# Patient Record
Sex: Female | Born: 1965
Health system: Southern US, Community
[De-identification: ages and names within clinical notes are randomized; demographics above are authoritative.]

## PROBLEM LIST (undated history)

## (undated) DIAGNOSIS — D509 Iron deficiency anemia, unspecified: Secondary | ICD-10-CM

## (undated) DIAGNOSIS — U071 COVID-19: Secondary | ICD-10-CM

## (undated) DIAGNOSIS — Q6102 Congenital multiple renal cysts: Secondary | ICD-10-CM

## (undated) DIAGNOSIS — C4492 Squamous cell carcinoma of skin, unspecified: Secondary | ICD-10-CM

## (undated) DIAGNOSIS — Z8669 Personal history of other diseases of the nervous system and sense organs: Secondary | ICD-10-CM

## (undated) DIAGNOSIS — F419 Anxiety disorder, unspecified: Secondary | ICD-10-CM

## (undated) DIAGNOSIS — K5900 Constipation, unspecified: Secondary | ICD-10-CM

## (undated) DIAGNOSIS — M1990 Unspecified osteoarthritis, unspecified site: Secondary | ICD-10-CM

## (undated) DIAGNOSIS — G510 Bell's palsy: Secondary | ICD-10-CM

## (undated) DIAGNOSIS — R200 Anesthesia of skin: Secondary | ICD-10-CM

## (undated) DIAGNOSIS — F329 Major depressive disorder, single episode, unspecified: Secondary | ICD-10-CM

## (undated) DIAGNOSIS — D219 Benign neoplasm of connective and other soft tissue, unspecified: Secondary | ICD-10-CM

## (undated) DIAGNOSIS — G4700 Insomnia, unspecified: Secondary | ICD-10-CM

## (undated) DIAGNOSIS — T7491XA Unspecified adult maltreatment, confirmed, initial encounter: Secondary | ICD-10-CM

## (undated) DIAGNOSIS — F32A Depression, unspecified: Secondary | ICD-10-CM

## (undated) DIAGNOSIS — R351 Nocturia: Secondary | ICD-10-CM

## (undated) DIAGNOSIS — M62838 Other muscle spasm: Secondary | ICD-10-CM

## (undated) DIAGNOSIS — K219 Gastro-esophageal reflux disease without esophagitis: Secondary | ICD-10-CM

## (undated) DIAGNOSIS — R531 Weakness: Secondary | ICD-10-CM

## (undated) DIAGNOSIS — L409 Psoriasis, unspecified: Secondary | ICD-10-CM

## (undated) DIAGNOSIS — I10 Essential (primary) hypertension: Secondary | ICD-10-CM

## (undated) HISTORY — DX: Nocturia: R35.1

## (undated) HISTORY — PX: MOHS SURGERY: SUR867

## (undated) HISTORY — DX: Anxiety disorder, unspecified: F41.9

## (undated) HISTORY — DX: Congenital multiple renal cysts: Q61.02

## (undated) HISTORY — DX: COVID-19: U07.1

## (undated) HISTORY — DX: Bell's palsy: G51.0

## (undated) HISTORY — PX: KNEE SURGERY: SHX244

## (undated) HISTORY — DX: Essential (primary) hypertension: I10

## (undated) HISTORY — DX: Unspecified adult maltreatment, confirmed, initial encounter: T74.91XA

## (undated) HISTORY — DX: Depression, unspecified: F32.A

## (undated) HISTORY — DX: Squamous cell carcinoma of skin, unspecified: C44.92

## (undated) HISTORY — DX: Psoriasis, unspecified: L40.9

## (undated) HISTORY — DX: Anesthesia of skin: R20.0

## (undated) HISTORY — DX: Weakness: R53.1

## (undated) HISTORY — DX: Gastro-esophageal reflux disease without esophagitis: K21.9

## (undated) HISTORY — DX: Morbid (severe) obesity due to excess calories: E66.01

## (undated) HISTORY — DX: Other muscle spasm: M62.838

## (undated) HISTORY — DX: Personal history of other diseases of the nervous system and sense organs: Z86.69

## (undated) HISTORY — DX: Iron deficiency anemia, unspecified: D50.9

## (undated) HISTORY — DX: Insomnia, unspecified: G47.00

## (undated) HISTORY — DX: Major depressive disorder, single episode, unspecified: F32.9

## (undated) HISTORY — DX: Benign neoplasm of connective and other soft tissue, unspecified: D21.9

## (undated) HISTORY — PX: NECK SURGERY: SHX720

---

## 2010-03-19 ENCOUNTER — Ambulatory Visit: Payer: Self-pay | Admitting: Obstetrics and Gynecology

## 2010-09-16 ENCOUNTER — Emergency Department (HOSPITAL_COMMUNITY): Admission: EM | Admit: 2010-09-16 | Discharge: 2010-09-16 | Payer: Self-pay | Admitting: Emergency Medicine

## 2010-09-21 ENCOUNTER — Emergency Department: Payer: Self-pay | Admitting: Emergency Medicine

## 2011-02-27 ENCOUNTER — Emergency Department: Payer: Self-pay | Admitting: Emergency Medicine

## 2011-04-09 NOTE — Assessment & Plan Note (Signed)
NAME:  Erika Hanson, Erika Hanson.:  [id number]   MEDICAL RECORD NO.:  [id number]          PATIENT TYPE:  POB   LOCATION:  [facility] at [facility]         FACILITY:  [facility]   PHYSICIAN:  Argentina Donovan, MD        DATE OF BIRTH:  24-Dec-1965   DATE OF SERVICE:  03/19/2010                                  CLINIC NOTE   The patient is a 44 year old Caucasian female, gravida 1, para 1-0-0-1  with a son 7 years old.  She only had 1 child because of desire and  with exception of her weight 310 pounds and height of 5 feet 5 inches  tall.  She has no major medical complaints except that over this past 6  months her period has gotten much much heavier.  She passes large clots.  They are predictable to the day when they should start and they do not  last more than 5 days.  However, during the 5 days, she bleeds very  heavy with large clots passing and has a lot of cramping the first 2  days of the period, that radiate into her back and down into her thighs.  She states that she bruises fairly easily.  She does not smoke, drink,  or take illicit drugs.  She has no medical allergies known and she has  always had normal Pap smears.  She has never had a mammogram and she has  no family history of breast malignancy.   PHYSICAL EXAMINATION:  VITAL SIGNS:  Blood pressure 149/91 with pulse of  78 per minute.  GENERAL:  She is a well-developed, markedly obese white female, in no  acute distress.  NECK:  Thyroid symmetrical.  No dominant masses noted.  BREASTS:  Symmetrical.  No masses.  No nipple discharge.  No  supraclavicular or axillary nodes palpable.  ABDOMEN:  Soft, flat, nontender.  No masses, no organomegaly.  GENITALIA:  External is normal.  BUS within normal limits.  The vagina  is clean and well rugated.  The cervix is clean, parous, and easy to  visualize.  The uterus could not be outlined nor the adnexa because of  habitus of the patient.   The plan is to get a CBC, TSH, PT and PTT  on this patient.  We are going  to follow that up with an ultrasound to evaluate the endometrium and  check for an ovarian cyst which she said she had several years ago was  told she had a cyst on the ovary.  Have her come back in 2 weeks giving  her information for endometrial ablation and then make a decision  whether she needs an endometrial biopsy or not.  My feeling is that she  probably will and then talk about the possibility of trying to control  this with hormone therapy and/or endometrial ablation.   IMPRESSION:  Six-month onset of severe menorrhagia with increasing  dysmenorrhea.           ______________________________  Argentina Donovan, MD     PR/MEDQ  D:  03/19/2010  T:  03/20/2010  Job:  [id number]

## 2011-04-28 ENCOUNTER — Emergency Department (HOSPITAL_COMMUNITY)
Admission: EM | Admit: 2011-04-28 | Discharge: 2011-04-28 | Disposition: A | Discharge status: Home / Self Care | Payer: Self-pay | Attending: Emergency Medicine | Admitting: Emergency Medicine

## 2011-04-28 DIAGNOSIS — M549 Dorsalgia, unspecified: Secondary | ICD-10-CM | POA: Insufficient documentation

## 2011-04-28 DIAGNOSIS — N390 Urinary tract infection, site not specified: Secondary | ICD-10-CM | POA: Insufficient documentation

## 2011-04-28 DIAGNOSIS — R350 Frequency of micturition: Secondary | ICD-10-CM | POA: Insufficient documentation

## 2011-04-28 LAB — URINALYSIS, ROUTINE W REFLEX MICROSCOPIC
Bilirubin Urine: NEGATIVE
Glucose, UA: NEGATIVE mg/dL
Ketones, ur: NEGATIVE mg/dL
Protein, ur: 30 mg/dL — AB

## 2011-04-28 LAB — URINE MICROSCOPIC-ADD ON

## 2012-05-20 ENCOUNTER — Emergency Department (HOSPITAL_COMMUNITY): Payer: Self-pay

## 2012-05-20 ENCOUNTER — Emergency Department (HOSPITAL_COMMUNITY)
Admission: EM | Admit: 2012-05-20 | Discharge: 2012-05-20 | Disposition: A | Discharge status: Home / Self Care | Payer: Self-pay | Attending: Emergency Medicine | Admitting: Emergency Medicine

## 2012-05-20 ENCOUNTER — Encounter (HOSPITAL_COMMUNITY): Payer: Self-pay

## 2012-05-20 DIAGNOSIS — R062 Wheezing: Secondary | ICD-10-CM | POA: Insufficient documentation

## 2012-05-20 MED ORDER — ALBUTEROL SULFATE HFA 108 (90 BASE) MCG/ACT IN AERS: 1.0000 | INHALATION_SPRAY | Freq: Four times a day (QID) | RESPIRATORY_TRACT | Status: DC | PRN

## 2012-05-20 MED ORDER — PREDNISONE 10 MG PO TABS: 20.0000 mg | ORAL_TABLET | Freq: Every day | ORAL | Status: DC

## 2012-05-20 MED ORDER — PREDNISONE 20 MG PO TABS
60.0000 mg | ORAL_TABLET | Freq: Once | ORAL | Status: AC
  Administered 2012-05-20: 60 mg via ORAL
  Filled 2012-05-20: qty 3

## 2012-05-20 MED ORDER — SODIUM CHLORIDE 0.9 % IN NEBU
INHALATION_SOLUTION | RESPIRATORY_TRACT | Status: AC
  Administered 2012-05-20: 3 mL
  Filled 2012-05-20: qty 3

## 2012-05-20 MED ORDER — ALBUTEROL SULFATE (5 MG/ML) 0.5% IN NEBU
5.0000 mg | INHALATION_SOLUTION | Freq: Once | RESPIRATORY_TRACT | Status: AC
  Administered 2012-05-20: 5 mg via RESPIRATORY_TRACT
  Filled 2012-05-20: qty 1

## 2012-05-20 NOTE — ED Provider Notes (Cosign Needed)
History     CSN: [id number]  Arrival date & time 05/20/12  0106   First MD Initiated Contact with Patient 05/20/12 0214      Chief Complaint  Patient presents with  . Wheezing  . Shortness of Breath    (Consider location/radiation/quality/duration/timing/severity/associated sxs/prior treatment) HPI  Erika Hanson is a 46 y.o. female who presents to the Emergency Department complaining of wheezing and shortness of breath that began when she was getting ready for bed. Denies recent illness, cough, fever, chills. She is not aware of any exposure to cause wheezing. She has taken no medicines.  History reviewed. No pertinent past medical history.  History reviewed. No pertinent past surgical history.  No family history on file.  History  Substance Use Topics  . Smoking status: Never Smoker   . Smokeless tobacco: Not on file  . Alcohol Use:     OB History    Grav Para Term Preterm Abortions TAB SAB Ect Mult Living                  Review of Systems  Constitutional: Negative for fever.       10 Systems reviewed and are negative for acute change except as noted in the HPI.  HENT: Negative for congestion.   Eyes: Negative for discharge and redness.  Respiratory: Positive for shortness of breath and wheezing. Negative for cough.   Cardiovascular: Negative for chest pain.  Gastrointestinal: Negative for vomiting and abdominal pain.  Musculoskeletal: Negative for back pain.  Skin: Negative for rash.  Neurological: Negative for syncope, numbness and headaches.  Psychiatric/Behavioral:       No behavior change.    Allergies  Review of patient's allergies indicates no known allergies.  Home Medications   Current Outpatient Rx  Name Route Sig Dispense Refill  . IBUPROFEN 200 MG PO TABS Oral Take 200 mg by mouth every 6 (six) hours as needed.    Marland Kitchen NAPROXEN 250 MG PO TABS Oral Take 250 mg by mouth 2 (two) times daily with a meal.      BP 163/77  Pulse 69  Temp  97.7 F (36.5 C) (Oral)  Resp 22  Ht 5\' 5"  (1.651 m)  Wt 260 lb (117.935 kg)  BMI 43.27 kg/m2  SpO2 97%  LMP 05/16/2012  Physical Exam  Nursing note and vitals reviewed. Constitutional: She appears well-developed and well-nourished. She appears distressed.       Awake, alert, nontoxic appearance.  HENT:  Head: Atraumatic.  Eyes: Right eye exhibits no discharge. Left eye exhibits no discharge.  Neck: Neck supple.  Cardiovascular: Normal rate and normal heart sounds.   Pulmonary/Chest: Effort normal. She has wheezes. She exhibits no tenderness.  Abdominal: Soft. Bowel sounds are normal. There is no tenderness. There is no rebound.  Musculoskeletal: She exhibits no tenderness.       Baseline ROM, no obvious new focal weakness.  Neurological:       Mental status and motor strength appears baseline for patient and situation.  Skin: No rash noted.  Psychiatric: She has a normal mood and affect.    ED Course  Procedures (including critical care time)  Labs Reviewed - No data to display Dg Chest 2 View  05/20/2012  *RADIOLOGY REPORT*  Clinical Data: Shortness of breath and wheezing  CHEST - 2 VIEW  Comparison: None.  Findings: Shallow inspiration.  Heart size and pulmonary vascularity are normal for technique.  No focal airspace consolidation in the lungs.  No  blunting of costophrenic angles. No pneumothorax.  Degenerative changes in the spine.  IMPRESSION: Shallow inspiration.  No evidence of active pulmonary disease.  Original Report Authenticated By: Marlon Pel, M.D.        MDM  Patient with onset of wheezing and shortness of breath when getting ready for bed. Given albuterol nebulizer treatment with resolution of the wheezing. Initiated steroid therapy. Pt feels improved after observation and/or treatment in ED.Pt stable in ED with no significant deterioration in condition.The patient appears reasonably screened and/or stabilized for discharge and I doubt any other  medical condition or other [facility] requiring further screening, evaluation, or treatment in the ED at this time prior to discharge.  MDM Reviewed: nursing note and vitals           Nicoletta Dress. Colon Branch, MD 05/20/12 [phone]

## 2012-05-20 NOTE — ED Notes (Signed)
Discharge instructions reviewed with pt, questions answered. Pt verbalized understanding.  

## 2012-05-20 NOTE — Discharge Instructions (Signed)
Your xray does not show bronchitis or pneumonia.Take all of the prednisone. Use the inhaler 4 times a day for the next 4 days then as needed for wheezing.

## 2012-05-20 NOTE — ED Notes (Signed)
Pt was lying down to sleep when she states she had sudden onset of sob and wheezing, states if she tries to cough the mucus just returns in her throat

## 2012-10-23 ENCOUNTER — Encounter (HOSPITAL_COMMUNITY): Payer: Self-pay | Admitting: Emergency Medicine

## 2012-10-23 ENCOUNTER — Emergency Department (HOSPITAL_COMMUNITY)
Admission: EM | Admit: 2012-10-23 | Discharge: 2012-10-23 | Disposition: A | Discharge status: Home / Self Care | Payer: Self-pay | Attending: Emergency Medicine | Admitting: Emergency Medicine

## 2012-10-23 DIAGNOSIS — M5432 Sciatica, left side: Secondary | ICD-10-CM

## 2012-10-23 DIAGNOSIS — M5430 Sciatica, unspecified side: Secondary | ICD-10-CM | POA: Insufficient documentation

## 2012-10-23 DIAGNOSIS — R3915 Urgency of urination: Secondary | ICD-10-CM | POA: Insufficient documentation

## 2012-10-23 DIAGNOSIS — R300 Dysuria: Secondary | ICD-10-CM | POA: Insufficient documentation

## 2012-10-23 DIAGNOSIS — Z9889 Other specified postprocedural states: Secondary | ICD-10-CM | POA: Insufficient documentation

## 2012-10-23 DIAGNOSIS — R350 Frequency of micturition: Secondary | ICD-10-CM | POA: Insufficient documentation

## 2012-10-23 DIAGNOSIS — N390 Urinary tract infection, site not specified: Secondary | ICD-10-CM | POA: Insufficient documentation

## 2012-10-23 DIAGNOSIS — M545 Low back pain, unspecified: Secondary | ICD-10-CM | POA: Insufficient documentation

## 2012-10-23 LAB — URINALYSIS, ROUTINE W REFLEX MICROSCOPIC
Nitrite: NEGATIVE
Specific Gravity, Urine: 1.01 (ref 1.005–1.030)
pH: 6 (ref 5.0–8.0)

## 2012-10-23 LAB — URINE MICROSCOPIC-ADD ON

## 2012-10-23 MED ORDER — PREDNISONE 50 MG PO TABS: 50.0000 mg | ORAL_TABLET | Freq: Every day | ORAL | Status: DC

## 2012-10-23 MED ORDER — HYDROCODONE-ACETAMINOPHEN 5-325 MG PO TABS: 1.0000 | ORAL_TABLET | Freq: Four times a day (QID) | ORAL | Status: AC | PRN

## 2012-10-23 MED ORDER — HYDROCODONE-ACETAMINOPHEN 5-325 MG PO TABS
1.0000 | ORAL_TABLET | Freq: Once | ORAL | Status: AC
  Administered 2012-10-23: 1 via ORAL
  Filled 2012-10-23: qty 1

## 2012-10-23 MED ORDER — PHENAZOPYRIDINE HCL 100 MG PO TABS
200.0000 mg | ORAL_TABLET | Freq: Once | ORAL | Status: AC
  Administered 2012-10-23: 200 mg via ORAL
  Filled 2012-10-23: qty 2

## 2012-10-23 MED ORDER — CIPROFLOXACIN HCL 500 MG PO TABS: 500.0000 mg | ORAL_TABLET | Freq: Two times a day (BID) | ORAL | Status: DC

## 2012-10-23 MED ORDER — CIPROFLOXACIN HCL 250 MG PO TABS
500.0000 mg | ORAL_TABLET | Freq: Once | ORAL | Status: AC
  Administered 2012-10-23: 500 mg via ORAL
  Filled 2012-10-23: qty 2

## 2012-10-23 NOTE — ED Notes (Signed)
EDPa in room to see pt for initial assessment.

## 2012-10-23 NOTE — ED Provider Notes (Signed)
Medical screening examination/treatment/procedure(s) were performed by non-physician practitioner and as supervising physician I was immediately available for consultation/collaboration.   Roczen Waymire, MD 10/23/12 1515 

## 2012-10-23 NOTE — ED Notes (Signed)
Pt c/o dysuria x 2 weeks. C/o lower back pain that radiates into groin and anterior thighs. Nad.

## 2012-10-23 NOTE — ED Provider Notes (Signed)
History     CSN: [id number]  Arrival date & time 10/23/12  1032   First MD Initiated Contact with Patient 10/23/12 1037      Chief Complaint  Patient presents with  . Dysuria  . Back Pain    (Consider location/radiation/quality/duration/timing/severity/associated sxs/prior treatment) HPI Comments: C/o dysuria, frequency and urgency but no hematuria for the past 3 days.  Also having low back pain that is worse with movement and palpation.  Radiates to L great and 2nd toe.    No injury.  No fever or chills.  No PCP.  The history is provided by the patient. No language interpreter was used.    History reviewed. No pertinent past medical history.  Past Surgical History  Procedure Date  . Cesarean section     History reviewed. No pertinent family history.  History  Substance Use Topics  . Smoking status: Never Smoker   . Smokeless tobacco: Not on file  . Alcohol Use: No    OB History    Grav Para Term Preterm Abortions TAB SAB Ect Mult Living                  Review of Systems  Constitutional: Negative for fever and chills.  Genitourinary: Positive for dysuria, urgency and frequency. Negative for hematuria, vaginal bleeding and vaginal discharge.  Musculoskeletal: Positive for back pain.  All other systems reviewed and are negative.    Allergies  Review of patient's allergies indicates no known allergies.  Home Medications   Current Outpatient Rx  Name  Route  Sig  Dispense  Refill  . IBUPROFEN 200 MG PO TABS   Oral   Take 600 mg by mouth every 6 (six) hours as needed. Pain         . CIPROFLOXACIN HCL 500 MG PO TABS   Oral   Take 1 tablet (500 mg total) by mouth 2 (two) times daily.   14 tablet   0   . HYDROCODONE-ACETAMINOPHEN 5-325 MG PO TABS   Oral   Take 1 tablet by mouth every 6 (six) hours as needed for pain.   20 tablet   0   . PREDNISONE 50 MG PO TABS   Oral   Take 1 tablet (50 mg total) by mouth daily.   6 tablet   0     BP  147/63  Pulse 75  Temp 97.6 F (36.4 C) (Oral)  Resp 19  SpO2 100%  LMP 09/26/2012  Physical Exam  Nursing note and vitals reviewed. Constitutional: She is oriented to person, place, and time. She appears well-developed and well-nourished. No distress.  HENT:  Head: Normocephalic and atraumatic.  Eyes: EOM are normal.  Neck: Normal range of motion.  Cardiovascular: Normal rate, regular rhythm and normal heart sounds.   Pulmonary/Chest: Effort normal and breath sounds normal.  Abdominal: Soft. Bowel sounds are normal. She exhibits no distension. There is tenderness in the suprapubic area. There is no guarding and no CVA tenderness.    Musculoskeletal: She exhibits tenderness.       Lumbar back: She exhibits decreased range of motion, tenderness, bony tenderness and pain. She exhibits no swelling, no edema, no deformity, no laceration, no spasm and normal pulse.       Back:  Neurological: She is alert and oriented to person, place, and time.  Skin: Skin is warm and dry.  Psychiatric: She has a normal mood and affect. Judgment normal.    ED Course  Procedures (including critical  care time)  Labs Reviewed  URINALYSIS, ROUTINE W REFLEX MICROSCOPIC - Abnormal; Notable for the following:    Hgb urine dipstick LARGE (*)     Protein, ur TRACE (*)     Leukocytes, UA MODERATE (*)     All other components within normal limits  URINE MICROSCOPIC-ADD ON - Abnormal; Notable for the following:    Bacteria, UA MANY (*)     All other components within normal limits  URINE CULTURE   No results found.   1. UTI (urinary tract infection)   2. Lumbar back pain   3. Left sided sciatica       MDM  rx-cipro 500 mg BID x 7 days rx-hydrocodone, 20 rx-prednisone 50 mg x 6 days. Return to ED prn        Evalina Field, PA 10/23/12 1213

## 2012-10-25 LAB — URINE CULTURE: Colony Count: >=100000

## 2012-10-26 NOTE — ED Notes (Signed)
+   urine Patient treated with cipro-sensitive to same-chart appended per protocol MD. 

## 2014-02-24 DIAGNOSIS — J159 Unspecified bacterial pneumonia: Secondary | ICD-10-CM | POA: Insufficient documentation

## 2014-02-24 DIAGNOSIS — Z792 Long term (current) use of antibiotics: Secondary | ICD-10-CM | POA: Insufficient documentation

## 2014-02-24 DIAGNOSIS — IMO0002 Reserved for concepts with insufficient information to code with codable children: Secondary | ICD-10-CM | POA: Insufficient documentation

## 2014-02-25 ENCOUNTER — Emergency Department (HOSPITAL_COMMUNITY): Payer: Self-pay

## 2014-02-25 ENCOUNTER — Emergency Department (HOSPITAL_COMMUNITY)
Admission: EM | Admit: 2014-02-25 | Discharge: 2014-02-25 | Disposition: A | Discharge status: Home / Self Care | Payer: Self-pay | Attending: Emergency Medicine | Admitting: Emergency Medicine

## 2014-02-25 ENCOUNTER — Encounter (HOSPITAL_COMMUNITY): Payer: Self-pay | Admitting: Emergency Medicine

## 2014-02-25 DIAGNOSIS — J189 Pneumonia, unspecified organism: Secondary | ICD-10-CM

## 2014-02-25 LAB — COMPREHENSIVE METABOLIC PANEL
ALBUMIN: 3.1 g/dL — AB (ref 3.5–5.2)
ALT: 24 U/L (ref 0–35)
AST: 18 U/L (ref 0–37)
Alkaline Phosphatase: 114 U/L (ref 39–117)
BUN: 14 mg/dL (ref 6–23)
CALCIUM: 9.2 mg/dL (ref 8.4–10.5)
CO2: 28 mEq/L (ref 19–32)
Chloride: 102 mEq/L (ref 96–112)
Creatinine, Ser: 0.89 mg/dL (ref 0.50–1.10)
GFR calc non Af Amer: 77 mL/min — ABNORMAL LOW (ref >90)
GFR, EST AFRICAN AMERICAN: 89 mL/min — AB (ref >90)
GLUCOSE: 106 mg/dL — AB (ref 70–99)
Potassium: 4.3 mEq/L (ref 3.7–5.3)
SODIUM: 139 meq/L (ref 137–147)
TOTAL PROTEIN: 6.8 g/dL (ref 6.0–8.3)
Total Bilirubin: 0.2 mg/dL — ABNORMAL LOW (ref 0.3–1.2)

## 2014-02-25 LAB — D-DIMER, QUANTITATIVE (NOT AT ARMC): D DIMER QUANT: 0.31 ug{FEU}/mL (ref 0.00–0.48)

## 2014-02-25 LAB — CBC WITH DIFFERENTIAL/PLATELET
BASOS PCT: 0 % (ref 0–1)
Basophils Absolute: 0 10*3/uL (ref 0.0–0.1)
EOS ABS: 0.2 10*3/uL (ref 0.0–0.7)
EOS PCT: 3 % (ref 0–5)
HCT: 38.7 % (ref 36.0–46.0)
Hemoglobin: 12.4 g/dL (ref 12.0–15.0)
LYMPHS ABS: 1.9 10*3/uL (ref 0.7–4.0)
Lymphocytes Relative: 26 % (ref 12–46)
MCH: 26.6 pg (ref 26.0–34.0)
MCHC: 32 g/dL (ref 30.0–36.0)
MCV: 83 fL (ref 78.0–100.0)
MONOS PCT: 8 % (ref 3–12)
Monocytes Absolute: 0.6 10*3/uL (ref 0.1–1.0)
Neutro Abs: 4.5 10*3/uL (ref 1.7–7.7)
Neutrophils Relative %: 63 % (ref 43–77)
PLATELETS: 243 10*3/uL (ref 150–400)
RBC: 4.66 MIL/uL (ref 3.87–5.11)
RDW: 14.8 % (ref 11.5–15.5)
WBC: 7.2 10*3/uL (ref 4.0–10.5)

## 2014-02-25 LAB — PRO B NATRIURETIC PEPTIDE: Pro B Natriuretic peptide (BNP): 81.3 pg/mL (ref 0–125)

## 2014-02-25 LAB — TROPONIN I

## 2014-02-25 MED ORDER — AZITHROMYCIN 250 MG PO TABS: ORAL_TABLET | ORAL | Status: DC

## 2014-02-25 NOTE — ED Notes (Addendum)
Pt reporting SOB worse at night when laying down.  Pt also reporting some swelling in lower extremities for approximately for 1 week.

## 2014-02-25 NOTE — ED Provider Notes (Signed)
CSN: [id number]     Arrival date & time 02/24/14  2336 History   First MD Initiated Contact with Patient 02/25/14 0056     Chief Complaint  Patient presents with  . sob x 1 week      (Consider location/radiation/quality/duration/timing/severity/associated sxs/prior Treatment) HPI Comments: Patient is a 48 year old female with history of morbid obesity. She presents with complaints of difficulty breathing which has been occurring for the past week. She states it is worse when she tries to go to sleep at night. When she lies flat she begins to wheeze. She denies any fevers, chills, or productive cough. She denies having any chest pain. She has no prior history of asthma or cardiac disease.  Patient is a 48 y.o. female presenting with shortness of breath. The history is provided by the patient.  Shortness of Breath Severity:  Moderate Onset quality:  Gradual Duration:  1 week Timing:  Intermittent Progression:  Worsening Chronicity:  New Context: activity   Context comment:  Lying flat Relieved by:  Nothing Worsened by:  Nothing tried Ineffective treatments:  None tried   History reviewed. No pertinent past medical history. Past Surgical History  Procedure Laterality Date  . Cesarean section     No family history on file. History  Substance Use Topics  . Smoking status: Never Smoker   . Smokeless tobacco: Not on file  . Alcohol Use: No   OB History   Grav Para Term Preterm Abortions TAB SAB Ect Mult Living                 Review of Systems  Respiratory: Positive for shortness of breath.   All other systems reviewed and are negative.      Allergies  Review of patient's allergies indicates no known allergies.  Home Medications   Current Outpatient Rx  Name  Route  Sig  Dispense  Refill  . ibuprofen (ADVIL,MOTRIN) 200 MG tablet   Oral   Take 600 mg by mouth every 6 (six) hours as needed. Pain         . ciprofloxacin (CIPRO) 500 MG tablet   Oral   Take 1  tablet (500 mg total) by mouth 2 (two) times daily.   14 tablet   0   . predniSONE (DELTASONE) 50 MG tablet   Oral   Take 1 tablet (50 mg total) by mouth daily.   6 tablet   0    BP 138/72  Pulse 79  Temp(Src) 97.8 F (36.6 C) (Oral)  Resp 22  Ht 5\' 5"  (1.651 m)  Wt 280 lb (127.007 kg)  BMI 46.59 kg/m2  SpO2 100%  LMP 02/15/2014 Physical Exam  Nursing note and vitals reviewed. Constitutional: She is oriented to person, place, and time. She appears well-developed and well-nourished. No distress.  HENT:  Head: Normocephalic and atraumatic.  Neck: Normal range of motion. Neck supple.  Cardiovascular: Normal rate and regular rhythm.  Exam reveals no gallop and no friction rub.   No murmur heard. Pulmonary/Chest: Effort normal and breath sounds normal. No respiratory distress. She has no wheezes.  Abdominal: Soft. Bowel sounds are normal. She exhibits no distension. There is no tenderness.  Musculoskeletal: Normal range of motion.  Neurological: She is alert and oriented to person, place, and time.  Skin: Skin is warm and dry. She is not diaphoretic.    ED Course  Procedures (including critical care time) Labs Review Labs Reviewed  CBC WITH DIFFERENTIAL  COMPREHENSIVE METABOLIC PANEL  TROPONIN  I  PRO B NATRIURETIC PEPTIDE   Imaging Review Dg Chest 2 View  02/25/2014   CLINICAL DATA:  Shortness of breath for 1 week.  EXAM: CHEST  2 VIEW  COMPARISON:  Chest radiograph performed 05/20/2012  FINDINGS: The lungs are well-aerated. Mild focal lower lobe opacity is suggested on the lateral view, which could reflect mild pneumonia. There is no evidence of pleural effusion or pneumothorax.  The heart is normal in size; the mediastinal contour is within normal limits. No acute osseous abnormalities are seen.  IMPRESSION: Mild focal lower lobe opacity suggested on the lateral view, which could reflect mild pneumonia, though this is not well characterized on the frontal view.    Electronically Signed   By: Garald Balding M.D.   On: 02/25/2014 01:00     EKG Interpretation   Date/Time:  Friday February 25 2014 02:12:06 EDT Ventricular Rate:  75 PR Interval:  130 QRS Duration: 94 QT Interval:  396 QTC Calculation: 442 R Axis:   29 Text Interpretation:  Normal sinus rhythm Normal ECG No previous ECGs  available Confirmed by DELOS  MD, Smera Guyette ([id number]) on 02/25/2014 2:34:17 AM      MDM   Final diagnoses:  None    Patient is a 48 year old female with complaints of difficulty breathing when she lies down to sleep at night. This is been going on for the past week. Workup reveals a chest x-ray suggestive of a subtle pneumonia, however remainder of the workup is unremarkable. Her EKG is normal and there is no evidence for congestive heart failure. Her d-dimer is negative and I have a low suspicion for PE. I feel as though this rules out this condition out. She will be treated for the pneumonia with Zithromax and is to followup with her primary Dr. if not improving.    Veryl Speak, MD 02/25/14 [phone]

## 2014-02-25 NOTE — ED Notes (Signed)
Pt states for the past week when she goes to bed, she is only able to sleep about and hour and she wakes up and feels sob and can hear herself wheezing.  Pt states when she takes a deep breath she has pain to right posterior chest.

## 2014-02-25 NOTE — Discharge Instructions (Signed)
Zithromax as prescribed.  Return to the ER if you develop worsening breathing, severe chest pain, or other new or concerning symptoms.   Pneumonia, Adult Pneumonia is an infection of the lungs.  CAUSES Pneumonia may be caused by bacteria or a virus. Usually, these infections are caused by breathing infectious particles into the lungs (respiratory tract). SYMPTOMS   Cough.  Fever.  Chest pain.  Increased rate of breathing.  Wheezing.  Mucus production. DIAGNOSIS  If you have the common symptoms of pneumonia, your caregiver will typically confirm the diagnosis with a chest X-ray. The X-ray will show an abnormality in the lung (pulmonary infiltrate) if you have pneumonia. Other tests of your blood, urine, or sputum may be done to find the specific cause of your pneumonia. Your caregiver may also do tests (blood gases or pulse oximetry) to see how well your lungs are working. TREATMENT  Some forms of pneumonia may be spread to other people when you cough or sneeze. You may be asked to wear a mask before and during your exam. Pneumonia that is caused by bacteria is treated with antibiotic medicine. Pneumonia that is caused by the influenza virus may be treated with an antiviral medicine. Most other viral infections must run their course. These infections will not respond to antibiotics.  PREVENTION A pneumococcal shot (vaccine) is available to prevent a common bacterial cause of pneumonia. This is usually suggested for:  People over 42 years old.  Patients on chemotherapy.  People with chronic lung problems, such as bronchitis or emphysema.  People with immune system problems. If you are over 65 or have a high risk condition, you may receive the pneumococcal vaccine if you have not received it before. In some countries, a routine influenza vaccine is also recommended. This vaccine can help prevent some cases of pneumonia.You may be offered the influenza vaccine as part of your  care. If you smoke, it is time to quit. You may receive instructions on how to stop smoking. Your caregiver can provide medicines and counseling to help you quit. HOME CARE INSTRUCTIONS   Cough suppressants may be used if you are losing too much rest. However, coughing protects you by clearing your lungs. You should avoid using cough suppressants if you can.  Your caregiver may have prescribed medicine if he or she thinks your pneumonia is caused by a bacteria or influenza. Finish your medicine even if you start to feel better.  Your caregiver may also prescribe an expectorant. This loosens the mucus to be coughed up.  Only take over-the-counter or prescription medicines for pain, discomfort, or fever as directed by your caregiver.  Do not smoke. Smoking is a common cause of bronchitis and can contribute to pneumonia. If you are a smoker and continue to smoke, your cough may last several weeks after your pneumonia has cleared.  A cold steam vaporizer or humidifier in your room or home may help loosen mucus.  Coughing is often worse at night. Sleeping in a semi-upright position in a recliner or using a couple pillows under your head will help with this.  Get rest as you feel it is needed. Your body will usually let you know when you need to rest. SEEK IMMEDIATE MEDICAL CARE IF:   Your illness becomes worse. This is especially true if you are elderly or weakened from any other disease.  You cannot control your cough with suppressants and are losing sleep.  You begin coughing up blood.  You develop pain which is  getting worse or is uncontrolled with medicines.  You have a fever.  Any of the symptoms which initially brought you in for treatment are getting worse rather than better.  You develop shortness of breath or chest pain. MAKE SURE YOU:   Understand these instructions.  Will watch your condition.  Will get help right away if you are not doing well or get worse. Document  Released: 11/11/2005 Document Revised: 02/03/2012 Document Reviewed: 01/31/2011 [facility] Patient Information 2014 [place], [place].

## 2014-03-08 ENCOUNTER — Emergency Department (HOSPITAL_COMMUNITY)
Admission: EM | Admit: 2014-03-08 | Discharge: 2014-03-08 | Disposition: A | Discharge status: Home / Self Care | Payer: Self-pay | Attending: Emergency Medicine | Admitting: Emergency Medicine

## 2014-03-08 ENCOUNTER — Emergency Department (HOSPITAL_COMMUNITY): Payer: Self-pay

## 2014-03-08 ENCOUNTER — Encounter (HOSPITAL_COMMUNITY): Payer: Self-pay | Admitting: Emergency Medicine

## 2014-03-08 DIAGNOSIS — E669 Obesity, unspecified: Secondary | ICD-10-CM | POA: Insufficient documentation

## 2014-03-08 DIAGNOSIS — Z8701 Personal history of pneumonia (recurrent): Secondary | ICD-10-CM | POA: Insufficient documentation

## 2014-03-08 DIAGNOSIS — R079 Chest pain, unspecified: Secondary | ICD-10-CM | POA: Insufficient documentation

## 2014-03-08 DIAGNOSIS — Z792 Long term (current) use of antibiotics: Secondary | ICD-10-CM | POA: Insufficient documentation

## 2014-03-08 DIAGNOSIS — J40 Bronchitis, not specified as acute or chronic: Secondary | ICD-10-CM | POA: Insufficient documentation

## 2014-03-08 DIAGNOSIS — IMO0002 Reserved for concepts with insufficient information to code with codable children: Secondary | ICD-10-CM | POA: Insufficient documentation

## 2014-03-08 LAB — COMPREHENSIVE METABOLIC PANEL
ALBUMIN: 3.1 g/dL — AB (ref 3.5–5.2)
ALK PHOS: 118 U/L — AB (ref 39–117)
ALT: 20 U/L (ref 0–35)
AST: 16 U/L (ref 0–37)
BILIRUBIN TOTAL: 0.2 mg/dL — AB (ref 0.3–1.2)
BUN: 12 mg/dL (ref 6–23)
CHLORIDE: 103 meq/L (ref 96–112)
CO2: 29 mEq/L (ref 19–32)
Calcium: 9 mg/dL (ref 8.4–10.5)
Creatinine, Ser: 0.77 mg/dL (ref 0.50–1.10)
GFR calc Af Amer: >90 mL/min (ref >90)
GFR calc non Af Amer: >90 mL/min (ref >90)
Glucose, Bld: 108 mg/dL — ABNORMAL HIGH (ref 70–99)
POTASSIUM: 4.4 meq/L (ref 3.7–5.3)
Sodium: 140 mEq/L (ref 137–147)
Total Protein: 6.8 g/dL (ref 6.0–8.3)

## 2014-03-08 LAB — CBC WITH DIFFERENTIAL/PLATELET
BASOS ABS: 0 10*3/uL (ref 0.0–0.1)
BASOS PCT: 0 % (ref 0–1)
Eosinophils Absolute: 0.2 10*3/uL (ref 0.0–0.7)
Eosinophils Relative: 3 % (ref 0–5)
HCT: 39.9 % (ref 36.0–46.0)
HEMOGLOBIN: 13 g/dL (ref 12.0–15.0)
Lymphocytes Relative: 22 % (ref 12–46)
Lymphs Abs: 1.5 10*3/uL (ref 0.7–4.0)
MCH: 26.8 pg (ref 26.0–34.0)
MCHC: 32.6 g/dL (ref 30.0–36.0)
MCV: 82.3 fL (ref 78.0–100.0)
MONOS PCT: 8 % (ref 3–12)
Monocytes Absolute: 0.5 10*3/uL (ref 0.1–1.0)
NEUTROS ABS: 4.5 10*3/uL (ref 1.7–7.7)
NEUTROS PCT: 67 % (ref 43–77)
Platelets: 290 10*3/uL (ref 150–400)
RBC: 4.85 MIL/uL (ref 3.87–5.11)
RDW: 14.8 % (ref 11.5–15.5)
WBC: 6.7 10*3/uL (ref 4.0–10.5)

## 2014-03-08 LAB — LIPASE, BLOOD: Lipase: 37 U/L (ref 11–59)

## 2014-03-08 MED ORDER — PREDNISONE 20 MG PO TABS: ORAL_TABLET | ORAL | Status: DC

## 2014-03-08 MED ORDER — ALBUTEROL SULFATE HFA 108 (90 BASE) MCG/ACT IN AERS
2.0000 | INHALATION_SPRAY | Freq: Once | RESPIRATORY_TRACT | Status: AC
  Administered 2014-03-08: 2 via RESPIRATORY_TRACT
  Filled 2014-03-08: qty 6.7

## 2014-03-08 MED ORDER — PREDNISONE 50 MG PO TABS
60.0000 mg | ORAL_TABLET | Freq: Once | ORAL | Status: AC
  Administered 2014-03-08: 60 mg via ORAL
  Filled 2014-03-08 (×2): qty 1

## 2014-03-08 NOTE — Care Management Note (Signed)
ED/CM noted patient did not have health insurance and/or PCP listed in the computer.  Patient was given the [place] resource handout with information on the clinics, food pantries, and the handout for new health insurance sign-up.  Patient expressed appreciation for information received. Pt was also given a Rx discount card, with the disclaimer that CM has no way of knowing how much the discount will be.

## 2014-03-08 NOTE — ED Notes (Signed)
Pt reports was seen here on April 2 and diagnosed with pneumonia.  Reports was put on zpack and started feeling better.  Pt reports was having  pain in right side but decreased while on antibiotics.  Pt says since Saturday,  Pain has gotten worse and started having cough, sob, and wheezing again.

## 2014-03-08 NOTE — ED Provider Notes (Signed)
CSN: [id number]     Arrival date & time 03/08/14  0932 History  This chart was scribed for Nat Christen, MD by Roxan Diesel, ED scribe.  This patient was seen in room APA01/APA01 and the patient's care was started at 9:45 AM.   Chief Complaint  Patient presents with  . Shortness of Breath    The history is provided by the patient. No language interpreter was used.    HPI Comments: Erika Hanson is a 48 y.o. female who presents to the Emergency Department complaining of right inferior lateral chest pain radiating to her back with associated SOB that began at least 2 weeks ago.  Pt was seen here 12 days ago for the same and received a CXR and was diagnosed with pneumonia.  She was placed on a Z-Pak and states her SOB and wheezing improved but her pain persisted.  Once she finished her antibiotics she also developed recurrence of her wheezing and SOB.  She reports cough is worse at night.  Rib pain is worsened by coughing.  She characterizes pain as tight and aching.  She states her respiratory symptoms feel the same as when she was initially diagnosed with pneumonia.  She denies loss of appetite or pain with eating.  Pt denies chronic medical conditions or regular medication usage.  She does not smoke.  Pt has no PCP    History reviewed. No pertinent past medical history.   Past Surgical History  Procedure Laterality Date  . Cesarean section      No family history on file.   History  Substance Use Topics  . Smoking status: Never Smoker   . Smokeless tobacco: Not on file  . Alcohol Use: No    OB History   Grav Para Term Preterm Abortions TAB SAB Ect Mult Living                   Review of Systems A complete 10 system review of systems was obtained and all systems are negative except as noted in the HPI and PMH.     Allergies  Review of patient's allergies indicates no known allergies.  Home Medications   Prior to Admission medications   Medication Sig Start  Date End Date Taking? Authorizing Provider  azithromycin (ZITHROMAX Z-PAK) 250 MG tablet 2 po day one, then 1 daily x 4 days 02/25/14   Veryl Speak, MD  ciprofloxacin (CIPRO) 500 MG tablet Take 1 tablet (500 mg total) by mouth 2 (two) times daily. 10/23/12   Richard Sabra Heck, PA-C  ibuprofen (ADVIL,MOTRIN) 200 MG tablet Take 600 mg by mouth every 6 (six) hours as needed. Pain    Historical Provider, MD  predniSONE (DELTASONE) 50 MG tablet Take 1 tablet (50 mg total) by mouth daily. 10/23/12   Richard Sabra Heck, PA-C   BP 153/66  Pulse 89  Temp(Src) 97.6 F (36.4 C) (Oral)  Resp 18  SpO2 98%  LMP 02/15/2014  Physical Exam  Nursing note and vitals reviewed. Constitutional: She is oriented to person, place, and time. She appears well-developed and well-nourished.  Obese  HENT:  Head: Normocephalic and atraumatic.  Eyes: Conjunctivae and EOM are normal. Pupils are equal, round, and reactive to light.  Neck: Normal range of motion. Neck supple.  Cardiovascular: Normal rate, regular rhythm and normal heart sounds.   Pulmonary/Chest: Effort normal and breath sounds normal. No respiratory distress. She has no wheezes. She has no rales. She exhibits tenderness (right inferior lateral chest).  Abdominal: Soft.  Bowel sounds are normal.  Musculoskeletal: Normal range of motion.  Neurological: She is alert and oriented to person, place, and time.  Skin: Skin is warm and dry.  Psychiatric: She has a normal mood and affect. Her behavior is normal.    ED Course  Procedures (including critical care time)  DIAGNOSTIC STUDIES: Oxygen Saturation is 98% on room air, normal by my interpretation.    COORDINATION OF CARE: 9:49 AM-Discussed treatment plan which includes CXR with pt at bedside and pt agreed to plan.    Dg Chest 2 View  03/08/2014   CLINICAL DATA:  Short of breath for 2 months.  Coughing  EXAM: CHEST  2 VIEW  COMPARISON:  DG CHEST 2 VIEW dated 02/25/2014  FINDINGS: Normal mediastinum and  cardiac silhouette. Normal pulmonary vasculature. No evidence of effusion, infiltrate, or pneumothorax. No acute bony abnormality. Degenerative osteophytosis of the thoracic spine.  IMPRESSION: No acute cardiopulmonary process.   Electronically Signed   By: Suzy Bouchard M.D.   On: 03/08/2014 10:31      Date: 03/08/2014  Rate: 80  Rhythm: normal sinus rhythm  QRS Axis: normal  Intervals: normal  ST/T Wave abnormalities: normal  Conduction Disutrbances: none  Narrative Interpretation: unremarkable    MDM   Final diagnoses:  Bronchitis    Patient is in no acute distress. Chest x-ray negative. Gallbladder ultrasound negative. No acute abdomen. Patient is most concerned about her wheezing. Rx albuterol inhaler and prednisone.    I personally performed the services described in this documentation, which was scribed in my presence. The recorded information has been reviewed and is accurate.     Nat Christen, MD 03/08/14 1316

## 2014-03-08 NOTE — Discharge Instructions (Signed)
Bronchitis Bronchitis is swelling (inflammation) of the air tubes leading to your lungs (bronchi). This causes mucus and a cough. If the swelling gets bad, you may have trouble breathing. HOME CARE   Rest.  Drink enough fluids to keep your pee (urine) clear or pale yellow (unless you have a condition where you have to watch how much you drink).  Only take medicine as told by your doctor. If you were given antibiotic medicines, finish them even if you start to feel better.  Avoid smoke, irritating chemicals, and strong smells. These make the problem worse. Quit smoking if you smoke. This helps your lungs heal faster.  Use a cool mist humidifier. Change the water in the humidifier every day. You can also sit in the bathroom with hot shower running for 5 10 minutes. Keep the door closed.  See your health care provider as told.  Wash your hands often. GET HELP IF: Your problems do not get better after 1 week. GET HELP RIGHT AWAY IF:   Your fever gets worse.  You have chills.  Your chest hurts.  Your problems breathing get worse.  You have blood in your mucus.  You pass out (faint).  You feel lightheaded.  You have a bad headache.  You throw up (vomit) again and again. MAKE SURE YOU:  Understand these instructions.  Will watch your condition.  Will get help right away if you are not doing well or get worse. Document Released: 04/29/2008 Document Revised: 09/01/2013 Document Reviewed: 07/06/2013 [facility] Patient Information 2014 [place], [place].  Chest x-ray showed no pneumonia. Ultrasound showed no gallstones. Use your inhaler 2 puffs every 3 hours. Start prednisone prescription tomorrow.

## 2014-12-06 ENCOUNTER — Encounter (HOSPITAL_COMMUNITY): Payer: Self-pay

## 2014-12-06 ENCOUNTER — Emergency Department (HOSPITAL_COMMUNITY)
Admission: EM | Admit: 2014-12-06 | Discharge: 2014-12-06 | Disposition: A | Discharge status: Home / Self Care | Payer: Self-pay | Attending: Emergency Medicine | Admitting: Emergency Medicine

## 2014-12-06 DIAGNOSIS — M62830 Muscle spasm of back: Secondary | ICD-10-CM | POA: Insufficient documentation

## 2014-12-06 DIAGNOSIS — Z7952 Long term (current) use of systemic steroids: Secondary | ICD-10-CM | POA: Insufficient documentation

## 2014-12-06 DIAGNOSIS — M546 Pain in thoracic spine: Secondary | ICD-10-CM | POA: Insufficient documentation

## 2014-12-06 DIAGNOSIS — Z9889 Other specified postprocedural states: Secondary | ICD-10-CM | POA: Insufficient documentation

## 2014-12-06 MED ORDER — IBUPROFEN 800 MG PO TABS: 800.0000 mg | ORAL_TABLET | Freq: Three times a day (TID) | ORAL | Status: DC | PRN

## 2014-12-06 MED ORDER — HYDROCODONE-ACETAMINOPHEN 5-325 MG PO TABS: 1.0000 | ORAL_TABLET | ORAL | Status: DC | PRN

## 2014-12-06 MED ORDER — DIAZEPAM 5 MG PO TABS: 5.0000 mg | ORAL_TABLET | Freq: Three times a day (TID) | ORAL | Status: DC | PRN

## 2014-12-06 MED ORDER — IBUPROFEN 800 MG PO TABS
800.0000 mg | ORAL_TABLET | Freq: Once | ORAL | Status: AC
  Administered 2014-12-06: 800 mg via ORAL
  Filled 2014-12-06: qty 1

## 2014-12-06 MED ORDER — HYDROCODONE-ACETAMINOPHEN 5-325 MG PO TABS
2.0000 | ORAL_TABLET | Freq: Once | ORAL | Status: AC
  Administered 2014-12-06: 2 via ORAL
  Filled 2014-12-06: qty 2

## 2014-12-06 MED ORDER — DIAZEPAM 5 MG PO TABS
5.0000 mg | ORAL_TABLET | Freq: Once | ORAL | Status: AC
  Administered 2014-12-06: 5 mg via ORAL
  Filled 2014-12-06: qty 1

## 2014-12-06 NOTE — ED Notes (Signed)
Patient states mid-right back pain X2 weeks. Patient states she was pushing a cart at work when the pain started. Patient states the pain has increased in frequency and is now a consistent ache

## 2014-12-06 NOTE — Discharge Instructions (Signed)
Heat Therapy Heat therapy can help ease sore, stiff, injured, and tight muscles and joints. Heat relaxes your muscles, which may help ease your pain.  RISKS AND COMPLICATIONS If you have any of the following conditions, do not use heat therapy unless your health care provider has approved:  Poor circulation.  Healing wounds or scarred skin in the area being treated.  Diabetes, heart disease, or high blood pressure.  Not being able to feel (numbness) the area being treated.  Unusual swelling of the area being treated.  Active infections.  Blood clots.  Cancer.  Inability to communicate pain. This may include young children and people who have problems with their brain function (dementia).  Pregnancy. Heat therapy should only be used on old, pre-existing, or long-lasting (chronic) injuries. Do not use heat therapy on new injuries unless directed by your health care provider. HOW TO USE HEAT THERAPY There are several different kinds of heat therapy, including:  Moist heat pack.  Warm water bath.  Hot water bottle.  Electric heating pad.  Heated gel pack.  Heated wrap.  Electric heating pad. Use the heat therapy method suggested by your health care provider. Follow your health care provider's instructions on when and how to use heat therapy. GENERAL HEAT THERAPY RECOMMENDATIONS  Do not sleep while using heat therapy. Only use heat therapy while you are awake.  Your skin may turn pink while using heat therapy. Do not use heat therapy if your skin turns red.  Do not use heat therapy if you have new pain.  High heat or long exposure to heat can cause burns. Be careful when using heat therapy to avoid burning your skin.  Do not use heat therapy on areas of your skin that are already irritated, such as with a rash or sunburn. SEEK MEDICAL CARE IF:  You have blisters, redness, swelling, or numbness.  You have new pain.  Your pain is worse. MAKE SURE  YOU:  Understand these instructions.  Will watch your condition.  Will get help right away if you are not doing well or get worse. Document Released: 02/03/2012 Document Revised: 03/28/2014 Document Reviewed: 01/04/2014 [facility] Patient Information 2015 [place], [place]. This information is not intended to replace advice given to you by your health care provider. Make sure you discuss any questions you have with your health care provider.  Muscle Cramps and Spasms Muscle cramps and spasms occur when a muscle or muscles tighten and you have no control over this tightening (involuntary muscle contraction). They are a common problem and can develop in any muscle. The most common place is in the calf muscles of the leg. Both muscle cramps and muscle spasms are involuntary muscle contractions, but they also have differences:   Muscle cramps are sporadic and painful. They may last a few seconds to a quarter of an hour. Muscle cramps are often more forceful and last longer than muscle spasms.  Muscle spasms may or may not be painful. They may also last just a few seconds or much longer. CAUSES  It is uncommon for cramps or spasms to be due to a serious underlying problem. In many cases, the cause of cramps or spasms is unknown. Some common causes are:   Overexertion.   Overuse from repetitive motions (doing the same thing over and over).   Remaining in a certain position for a long period of time.   Improper preparation, form, or technique while performing a sport or activity.   Dehydration.   Injury.  Side effects of some medicines.   Abnormally low levels of the salts and ions in your blood (electrolytes), especially potassium and calcium. This could happen if you are taking water pills (diuretics) or you are pregnant.  Some underlying medical problems can make it more likely to develop cramps or spasms. These include, but are not limited to:   Diabetes.   Parkinson disease.    Hormone disorders, such as thyroid problems.   Alcohol abuse.   Diseases specific to muscles, joints, and bones.   Blood vessel disease where not enough blood is getting to the muscles.  HOME CARE INSTRUCTIONS   Stay well hydrated. Drink enough water and fluids to keep your urine clear or pale yellow.  It may be helpful to massage, stretch, and relax the affected muscle.  For tight or tense muscles, use a warm towel, heating pad, or hot shower water directed to the affected area.  If you are sore or have pain after a cramp or spasm, applying ice to the affected area may relieve discomfort.  Put ice in a plastic bag.  Place a towel between your skin and the bag.  Leave the ice on for 15-20 minutes, 03-04 times a day.  Medicines used to treat a known cause of cramps or spasms may help reduce their frequency or severity. Only take over-the-counter or prescription medicines as directed by your caregiver. SEEK MEDICAL CARE IF:  Your cramps or spasms get more severe, more frequent, or do not improve over time.  MAKE SURE YOU:   Understand these instructions.  Will watch your condition.  Will get help right away if you are not doing well or get worse. Document Released: 05/03/2002 Document Revised: 03/08/2013 Document Reviewed: 10/28/2012 [facility] Patient Information 2015 [place], [place]. This information is not intended to replace advice given to you by your health care provider. Make sure you discuss any questions you have with your health care provider.  Thoracic Strain You have injured the muscles or tendons that attach to the upper part of your back behind your chest. This injury is called a thoracic strain, thoracic sprain, or mid-back strain.  CAUSES  The cause of thoracic strain varies. A less severe injury involves pulling a muscle or tendon without tearing it. A more severe injury involves tearing (rupturing) a muscle or tendon. With less severe injuries, there may  be little loss of strength. Sometimes, there are breaks (fractures) in the bones to which the muscles are attached. These fractures are rare, unless there was a direct hit (trauma) or you have weak bones due to osteoporosis or age. Longstanding strains may be caused by overuse or improper form during certain movements. Obesity can also increase your risk for back injuries. Sudden strains may occur due to injury or not warming up properly before exercise. Often, there is no obvious cause for a thoracic strain. SYMPTOMS  The main symptom is pain, especially with movement, such as during exercise. DIAGNOSIS  Your caregiver can usually tell what is wrong by taking an X-ray and doing a physical exam. TREATMENT   Physical therapy may be helpful for recovery. Your caregiver can give you exercises to do or refer you to a physical therapist after your pain improves.  After your pain improves, strengthening and conditioning programs appropriate for your sport or occupation may be helpful.  Always warm up before physical activities or athletics. Stretching after physical activity may also help.  Certain over-the-counter medicines may also help. Ask your caregiver if there  are medicines that would help you. If this is your first thoracic strain injury, proper care and proper healing time before starting activities should prevent long-term problems. Torn ligaments and tendons require as long to heal as broken bones. Average healing times may be only 1 week for a mild strain. For torn muscles and tendons, healing time may be up to 6 weeks to 2 months. HOME CARE INSTRUCTIONS   Apply ice to the injured area. Ice massages may also be used as directed.  Put ice in a plastic bag.  Place a towel between your skin and the bag.  Leave the ice on for 15-20 minutes, 03-04 times a day, for the first 2 days.  Only take over-the-counter or prescription medicines for pain, discomfort, or fever as directed by your  caregiver.  Keep your appointments for physical therapy if this was prescribed.  Use wraps and back braces as instructed. SEEK IMMEDIATE MEDICAL CARE IF:   You have an increase in bruising, swelling, or pain.  Your pain has not improved with medicines.  You develop new shortness of breath, chest pain, or fever.  Problems seem to be getting worse rather than better. MAKE SURE YOU:   Understand these instructions.  Will watch your condition.  Will get help right away if you are not doing well or get worse. Document Released: 02/01/2004 Document Revised: 02/03/2012 Document Reviewed: 12/28/2010 [facility] Patient Information 2015 [place], [place]. This information is not intended to replace advice given to you by your health care provider. Make sure you discuss any questions you have with your health care provider.

## 2014-12-06 NOTE — ED Provider Notes (Signed)
TIME SEEN: 8:29  CHIEF COMPLAINT: Back Pain  HPI: Erika Hanson is a 49 y.o. F with h/o C-section who presents to the ED with right sided back pain with onset 2 weeks ago.  Erika Hanson notes pain radiates to her upper right abdomen. Erika Hanson stated she was pushing a cart at work at onset. . Erika Hanson notes movement worsens the pain. She denies eating to worsen the pain. Erika Hanson denies an Hx of back pain.  Erika Hanson denies numbness, tingling, bowel or bladder incontinence, urinary retention, nausea, vomiting, diarrhea, fever, dysuria, hematuria and PMHx of nephrolithiasis or cholelithiasis. No cough, chest pain or shortness of breath. No history of PE or DVT.  ROS: See HPI Constitutional: no fever  Eyes: no drainage  ENT: no runny nose   Cardiovascular:  no chest pain  Resp: no SOB  GI: no vomiting GU: no dysuria Integumentary: no rash  Allergy: no hives  Musculoskeletal: no leg swelling  Neurological: no slurred speech ROS otherwise negative  PAST MEDICAL HISTORY/PAST SURGICAL HISTORY:  History reviewed. No pertinent past medical history.  MEDICATIONS:  Prior to Admission medications   Medication Sig Start Date End Date Taking? Authorizing Provider  ibuprofen (ADVIL,MOTRIN) 200 MG tablet Take 600 mg by mouth every 6 (six) hours as needed. Pain    Historical Provider, MD  predniSONE (DELTASONE) 20 MG tablet 3 tabs po day one, then 2 po daily x 4 days 03/08/14   Nat Christen, MD    ALLERGIES:  No Known Allergies  SOCIAL HISTORY:  History  Substance Use Topics  . Smoking status: Never Smoker   . Smokeless tobacco: Not on file  . Alcohol Use: No    FAMILY HISTORY: History reviewed. No pertinent family history.  EXAM: BP 190/96 mmHg  Pulse 74  Temp(Src) 98 F (36.7 C) (Oral)  Resp 16  Ht 5\' 5"  (1.651 m)  Wt 260 lb (117.935 kg)  BMI 43.27 kg/m2  SpO2 98%  LMP 11/22/2014 CONSTITUTIONAL: Alert and oriented and responds appropriately to questions. Well-appearing; well-nourished; morbidly obese HEAD:  Normocephalic EYES: Conjunctivae clear, PERRL ENT: normal nose; no rhinorrhea; moist mucous membranes; pharynx without lesions noted NECK: Supple, no meningismus, no LAD  CARD: RRR; S1 and S2 appreciated; no murmurs, no clicks, no rubs, no gallops RESP: Normal chest excursion without splinting or tachypnea; breath sounds clear and equal bilaterally; no wheezes, no rhonchi, no rales,  ABD/GI: Normal bowel sounds; non-distended; soft, non-tender, no rebound, no guarding, negative Murphy sign BACK:  The back appears normal and is non-tender to palpation, there is no CVA tenderness, TTP to md right thoracic region with associated muscle spasm; no midline spinal tenderness, crepitus, or step-off EXT: Normal ROM in all joints; non-tender to palpation; no edema; normal capillary refill; no cyanosis    SKIN: Normal color for age and race; warm NEURO: Moves all extremities equally, sensation to light touch intact diffusely PSYCH: The patient's mood and manner are appropriate. Grooming and personal hygiene are appropriate.  MEDICAL DECISION MAKING: Patient here with thoracic right-sided muscle spasm. Doubt pneumonia, pulmonary embolus, kidney stone, pyelonephritis, cholecystitis or cholelithiasis. Pain is reproducible with movement and palpation. She is neurologically intact. I do not feel she needs further imaging or workup in the ED. We'll discharge with ibuprofen, Vicodin and Valium. Have advised her to follow-up with a PCP. Have advised her to use heat and massage for pain relief as well. Discussed return precautions. She verbalized understanding and is comfortable with plan.     I personally performed the services  described in this documentation, which was scribed in my presence. The recorded information has been reviewed and is accurate.    [place], DO 12/06/14 2102

## 2014-12-06 NOTE — ED Notes (Signed)
Pt left Ed, ambulatory with no signs of distress. Pt verbalized discharge instructions.

## 2015-09-10 ENCOUNTER — Emergency Department (HOSPITAL_COMMUNITY): Payer: Self-pay

## 2015-09-10 ENCOUNTER — Emergency Department (HOSPITAL_COMMUNITY)
Admission: EM | Admit: 2015-09-10 | Discharge: 2015-09-10 | Disposition: A | Discharge status: Home / Self Care | Payer: Self-pay | Attending: Emergency Medicine | Admitting: Emergency Medicine

## 2015-09-10 ENCOUNTER — Encounter (HOSPITAL_COMMUNITY): Payer: Self-pay | Admitting: Emergency Medicine

## 2015-09-10 DIAGNOSIS — M25562 Pain in left knee: Secondary | ICD-10-CM | POA: Insufficient documentation

## 2015-09-10 LAB — D-DIMER, QUANTITATIVE: D-Dimer, Quant: 0.53 ug/mL-FEU — ABNORMAL HIGH (ref 0.00–0.48)

## 2015-09-10 MED ORDER — TRAMADOL HCL 50 MG PO TABS: 50.0000 mg | ORAL_TABLET | Freq: Four times a day (QID) | ORAL | Status: DC | PRN

## 2015-09-10 MED ORDER — ENOXAPARIN SODIUM 120 MG/0.8ML ~~LOC~~ SOLN
1.0000 mg/kg | Freq: Once | SUBCUTANEOUS | Status: AC
  Administered 2015-09-10: 120 mg via SUBCUTANEOUS
  Filled 2015-09-10: qty 0.8

## 2015-09-10 MED ORDER — TRAMADOL HCL 50 MG PO TABS
50.0000 mg | ORAL_TABLET | Freq: Once | ORAL | Status: AC
  Administered 2015-09-10: 50 mg via ORAL
  Filled 2015-09-10: qty 1

## 2015-09-10 MED ORDER — KETOROLAC TROMETHAMINE 30 MG/ML IJ SOLN
30.0000 mg | Freq: Once | INTRAMUSCULAR | Status: AC
  Administered 2015-09-10: 30 mg via INTRAMUSCULAR
  Filled 2015-09-10: qty 1

## 2015-09-10 MED ORDER — IBUPROFEN 600 MG PO TABS: 600.0000 mg | ORAL_TABLET | Freq: Three times a day (TID) | ORAL | Status: AC

## 2015-09-10 NOTE — ED Provider Notes (Addendum)
CSN: [id number]     Arrival date & time 09/10/15  1724 History   First MD Initiated Contact with Patient 09/10/15 1734     Chief Complaint  Patient presents with  . Knee Pain     (Consider location/radiation/quality/duration/timing/severity/associated sxs/prior Treatment) HPI Patient presents with new left knee swelling, pain. Symptoms are isolated to the left leg, with pain primarily in the posterior aspect. Pain is worse with motion, ambulation. Patient is minimally better with OTC medication. Distal loss of sensation or function. No proximal swelling, fever, chills, chest pain, belly pain. No dyspnea, syncope.  History reviewed. No pertinent past medical history. Past Surgical History  Procedure Laterality Date  . Cesarean section     History reviewed. No pertinent family history. Social History  Substance Use Topics  . Smoking status: Never Smoker   . Smokeless tobacco: Never Used  . Alcohol Use: No   OB History    Gravida Para Term Preterm AB TAB SAB Ectopic Multiple Living   1 1 1       1      Review of Systems  Constitutional: Negative for fever.  Respiratory: Negative for shortness of breath.   Cardiovascular: Negative for chest pain.  Musculoskeletal:       Negative aside from HPI  Skin:       Negative aside from HPI  Allergic/Immunologic: Negative for immunocompromised state.  Neurological: Negative for weakness.      Allergies  Review of patient's allergies indicates no known allergies.  Home Medications   Prior to Admission medications   Medication Sig Start Date End Date Taking? Authorizing Provider  diazepam (VALIUM) 5 MG tablet Take 1 tablet (5 mg total) by mouth every 8 (eight) hours as needed for muscle spasms. 12/06/14   Kristen N Ward, DO  HYDROcodone-acetaminophen (NORCO/VICODIN) 5-325 MG per tablet Take 1 tablet by mouth every 4 (four) hours as needed. 12/06/14   Kristen N Ward, DO  ibuprofen (ADVIL,MOTRIN) 800 MG tablet Take 1 tablet (800  mg total) by mouth every 8 (eight) hours as needed for mild pain. 12/06/14   Kristen N Ward, DO  predniSONE (DELTASONE) 20 MG tablet 3 tabs po day one, then 2 po daily x 4 days Patient not taking: Reported on 12/06/2014 03/08/14   Nat Christen, MD   BP 188/93 mmHg  Pulse 76  Temp(Src) 97.7 F (36.5 C) (Oral)  Resp 18  Ht 5\' 5"  (1.651 m)  Wt 260 lb (117.935 kg)  BMI 43.27 kg/m2  SpO2 98%  LMP 09/03/2015 Physical Exam  Constitutional: She is oriented to person, place, and time. She appears well-developed and well-nourished. No distress.  Morbidly obese F in NAD, speaking clearly w no e/o respiratory distress  HENT:  Head: Normocephalic and atraumatic.  Eyes: Conjunctivae and EOM are normal.  Cardiovascular: Normal rate and regular rhythm.   Pulmonary/Chest: Effort normal and breath sounds normal. No stridor. No respiratory distress.  Abdominal: She exhibits no distension.  Musculoskeletal: She exhibits no edema.  Obese legs, left calf is almost 1 inch bigger than the right, with mild pain with palpation of the left popliteal fossa, but no appreciable deformity. Range of motion seems normal, though there is difficulty ascertaining pain secondary to size.  Neurological: She is alert and oriented to person, place, and time. No cranial nerve deficit.  Skin: Skin is warm and dry.  Psychiatric: She has a normal mood and affect.  Nursing note and vitals reviewed.   ED Course  Procedures (including critical  care time) Labs Review Labs Reviewed  D-DIMER, QUANTITATIVE (NOT AT [facility]) - Abnormal; Notable for the following:    D-Dimer, Quant 0.53 (*)    All other components within normal limits    Imaging Review Dg Knee Complete 4 Views Left  09/10/2015  CLINICAL DATA:  Left knee pain.  No specific injury. EXAM: LEFT KNEE - COMPLETE 4+ VIEW COMPARISON:  None. FINDINGS: No fracture. No bone lesion. There are marginal osteophytes from all 3 compartments. No joint effusion.  Soft tissues are  unremarkable. IMPRESSION: 1. No fracture or acute finding. 2. Degenerative changes affecting all 3 joint space compartments. Electronically Signed   By: Lajean Manes M.D.   On: 09/10/2015 18:13   I have personally reviewed and evaluated these images and lab results as part of my medical decision-making.  7:08 PM Patient in no distress. We discussed all findings. With positive d-dimer, patient will return tomorrow, after empiric therapy with Lovenox  MDM  Patient presents with new isolated left knee pain. Patient's presentation is concerning for a Baker's cyst versus DVT, though she has a minimal risk profile, beyond recent car trip to the beach. No evidence for pulmonary embolism, infection, fracture. Patient received Lovenox will return tomorrow.  Carmin Muskrat, MD 09/10/15 Einar Crow  Carmin Muskrat, MD 10/05/15 2040

## 2015-09-10 NOTE — Discharge Instructions (Signed)
As discussed, it is important that you return tomorrow for repeat evaluation via ultrasound, to exclude the possibility of blood clot as causing your left knee pain.  Return earlier, if you develop new, or concerning changes in your condition.

## 2015-09-10 NOTE — ED Notes (Addendum)
Patient c/o left knee pain and "slight" swelling that started yesterday. Denies any injury. Per patient taking advil with no relief. Per patient pain in posterior aspect of knee, worse with weight bearing.

## 2015-09-11 ENCOUNTER — Ambulatory Visit (HOSPITAL_COMMUNITY)
Admission: RE | Admit: 2015-09-11 | Discharge: 2015-09-11 | Disposition: A | Discharge status: Home / Self Care | Payer: Self-pay | Source: Ambulatory Visit | Attending: Emergency Medicine | Admitting: Emergency Medicine

## 2015-09-11 DIAGNOSIS — M79605 Pain in left leg: Secondary | ICD-10-CM | POA: Insufficient documentation

## 2015-09-11 DIAGNOSIS — R600 Localized edema: Secondary | ICD-10-CM | POA: Insufficient documentation

## 2016-05-28 ENCOUNTER — Encounter (HOSPITAL_COMMUNITY): Payer: Self-pay | Admitting: Emergency Medicine

## 2016-05-28 ENCOUNTER — Emergency Department (HOSPITAL_COMMUNITY): Payer: Self-pay

## 2016-05-28 ENCOUNTER — Emergency Department (HOSPITAL_COMMUNITY)
Admission: EM | Admit: 2016-05-28 | Discharge: 2016-05-28 | Disposition: A | Discharge status: Home / Self Care | Payer: Self-pay | Attending: Emergency Medicine | Admitting: Emergency Medicine

## 2016-05-28 DIAGNOSIS — E119 Type 2 diabetes mellitus without complications: Secondary | ICD-10-CM | POA: Insufficient documentation

## 2016-05-28 DIAGNOSIS — N390 Urinary tract infection, site not specified: Secondary | ICD-10-CM | POA: Insufficient documentation

## 2016-05-28 DIAGNOSIS — Z79899 Other long term (current) drug therapy: Secondary | ICD-10-CM | POA: Insufficient documentation

## 2016-05-28 DIAGNOSIS — R531 Weakness: Secondary | ICD-10-CM | POA: Insufficient documentation

## 2016-05-28 DIAGNOSIS — R42 Dizziness and giddiness: Secondary | ICD-10-CM | POA: Insufficient documentation

## 2016-05-28 LAB — BASIC METABOLIC PANEL
ANION GAP: 11 (ref 5–15)
BUN: 16 mg/dL (ref 6–20)
CO2: 24 mmol/L (ref 22–32)
Calcium: 9.2 mg/dL (ref 8.9–10.3)
Chloride: 102 mmol/L (ref 101–111)
Creatinine, Ser: 0.86 mg/dL (ref 0.44–1.00)
GFR calc Af Amer: >60 mL/min (ref >60)
Glucose, Bld: 112 mg/dL — ABNORMAL HIGH (ref 65–99)
Potassium: 3.6 mmol/L (ref 3.5–5.1)
SODIUM: 137 mmol/L (ref 135–145)

## 2016-05-28 LAB — URINALYSIS, ROUTINE W REFLEX MICROSCOPIC
Bilirubin Urine: NEGATIVE
Glucose, UA: NEGATIVE mg/dL
KETONES UR: NEGATIVE mg/dL
NITRITE: POSITIVE — AB
PH: 6 (ref 5.0–8.0)
Protein, ur: 30 mg/dL — AB
Specific Gravity, Urine: 1.02 (ref 1.005–1.030)

## 2016-05-28 LAB — CBC
HCT: 40.9 % (ref 36.0–46.0)
Hemoglobin: 12.7 g/dL (ref 12.0–15.0)
MCH: 23.4 pg — ABNORMAL LOW (ref 26.0–34.0)
MCHC: 31.1 g/dL (ref 30.0–36.0)
MCV: 75.5 fL — ABNORMAL LOW (ref 78.0–100.0)
Platelets: 394 10*3/uL (ref 150–400)
RBC: 5.42 MIL/uL — ABNORMAL HIGH (ref 3.87–5.11)
RDW: 15.5 % (ref 11.5–15.5)
WBC: 8.2 10*3/uL (ref 4.0–10.5)

## 2016-05-28 LAB — TROPONIN I

## 2016-05-28 LAB — URINE MICROSCOPIC-ADD ON

## 2016-05-28 LAB — PREGNANCY, URINE: Preg Test, Ur: NEGATIVE

## 2016-05-28 MED ORDER — CEPHALEXIN 500 MG PO CAPS: 500.0000 mg | ORAL_CAPSULE | Freq: Four times a day (QID) | ORAL | Status: DC

## 2016-05-28 MED ORDER — CEPHALEXIN 500 MG PO CAPS
500.0000 mg | ORAL_CAPSULE | Freq: Once | ORAL | Status: AC
  Administered 2016-05-28: 500 mg via ORAL
  Filled 2016-05-28: qty 1

## 2016-05-28 NOTE — ED Provider Notes (Signed)
CSN: [id number]     Arrival date & time 05/28/16  1703 History   First MD Initiated Contact with Patient 05/28/16 1718     Chief Complaint  Patient presents with  . Weakness      HPI  Pt was seen at 1725. Per pt, c/o gradual onset and persistence of constant generalized weakness for the past 9+ months. Pt describes her symptoms as being "weak all over," esp RLE that has been ongoing and unchanged for 9+ months.  Pt states she has been evaluated by her PMD several times for same, and is due to "see a Neurologist to see if maybe I have MS." Pt denies any new focal weakness, no tingling/numbness in extremities, no CP/SOB, no abd pain, no N/V/D, no back or neck pain, no fevers, no rash.    Past Medical History  Diagnosis Date  . Diabetes mellitus without complication [facility])    Past Surgical History  Procedure Laterality Date  . Cesarean section      Social History  Substance Use Topics  . Smoking status: Never Smoker   . Smokeless tobacco: Never Used  . Alcohol Use: No   OB History    Gravida Para Term Preterm AB TAB SAB Ectopic Multiple Living   1 1 1       1      Review of Systems ROS: Statement: All systems negative except as marked or noted in the HPI; Constitutional: +generalized weakness. Negative for fever and chills. ; ; Eyes: Negative for eye pain, redness and discharge. ; ; ENMT: Negative for ear pain, hoarseness, nasal congestion, sinus pressure and sore throat. ; ; Cardiovascular: Negative for chest pain, palpitations, diaphoresis, dyspnea and peripheral edema. ; ; Respiratory: Negative for cough, wheezing and stridor. ; ; Gastrointestinal: Negative for nausea, vomiting, diarrhea, abdominal pain, blood in stool, hematemesis, jaundice and rectal bleeding. ; ; Genitourinary: Negative for dysuria, flank pain and hematuria. ; ; Musculoskeletal: Negative for back pain and neck pain. Negative for swelling and trauma.; ; Skin: Negative for pruritus, rash, abrasions, blisters, bruising  and skin lesion.; ; Neuro: Negative for headache, lightheadedness and neck stiffness. Negative for altered level of consciousness, altered mental status, paresthesias, involuntary movement, seizure and syncope.     Allergies  Review of patient's allergies indicates no known allergies.  Home Medications   Prior to Admission medications   Medication Sig Start Date End Date Taking? Authorizing Provider  albuterol (PROVENTIL HFA;VENTOLIN HFA) 108 (90 Base) MCG/ACT inhaler Inhale 1-2 puffs into the lungs every 6 (six) hours as needed for wheezing or shortness of breath.   Yes Historical Provider, MD  citalopram (CELEXA) 20 MG tablet Take 20 mg by mouth at bedtime.   Yes Historical Provider, MD  gabapentin (NEURONTIN) 300 MG capsule Take 300 mg by mouth 3 (three) times daily.   Yes Historical Provider, MD  hydrochlorothiazide (HYDRODIURIL) 25 MG tablet Take 25 mg by mouth daily.   Yes Historical Provider, MD  losartan (COZAAR) 50 MG tablet Take 50 mg by mouth at bedtime.   Yes Historical Provider, MD  omeprazole (PRILOSEC) 20 MG capsule Take 20 mg by mouth at bedtime.   Yes Historical Provider, MD  oxybutynin (DITROPAN-XL) 5 MG 24 hr tablet Take 5 mg by mouth at bedtime.   Yes Historical Provider, MD  Vitamin D, Ergocalciferol, (DRISDOL) 50000 units CAPS capsule Take 50,000 Units by mouth every Saturday.   Yes Historical Provider, MD   BP 159/97 mmHg  Pulse 69  Temp(Src) 97.8  F (36.6 C) (Oral)  Resp 25  Ht 5\' 4"  (1.626 m)  Wt 280 lb (127.007 kg)  BMI 48.04 kg/m2  SpO2 98%  LMP 05/21/2016   18:53 Orthostatic Vital Signs CS  Orthostatic Lying  - BP- Lying: 153/79 mmHg ; Pulse- Lying: 65  Orthostatic Sitting - BP- Sitting: 146/87 mmHg ; Pulse- Sitting: 79  Orthostatic Standing at 0 minutes - BP- Standing at 0 minutes: 148/75 mmHg ; Pulse- Standing at 0 minutes: 94      Physical Exam  1730: Physical examination:  Nursing notes reviewed; Vital signs and O2 SAT reviewed;  Constitutional:  Well developed, Well nourished, Well hydrated, In no acute distress.; Head:  Normocephalic, atraumatic; Eyes: EOMI, PERRL, No scleral icterus; ENMT: Mouth and pharynx normal, Mucous membranes moist; Neck: Supple, Full range of motion, No lymphadenopathy; Cardiovascular: Regular rate and rhythm, No gallop; Respiratory: Breath sounds clear & equal bilaterally, No wheezes.  Speaking full sentences with ease, Normal respiratory effort/excursion; Chest: Nontender, Movement normal; Abdomen: Soft, Nontender, Nondistended, Normal bowel sounds; Genitourinary: No CVA tenderness; Extremities: Pulses normal, No tenderness, No edema, No calf edema or asymmetry.; Neuro: AA&Ox3, Major CN grossly intact. No facial droop. Speech clear. Grips equal. Grips equal. Strength 5/5 equal bilat UE's and LLE. Strength 4/5 RLE. DTR 2/4 equal bilat UE's and LE's. No gross focal sensory deficits. Normal cerebellar testing bilat UE's (finger-nose); will cross both legs. ;; Skin: Color normal, Warm, Dry.    ED Course  Procedures (including critical care time)  Labs Review  Imaging Review  I have personally reviewed and evaluated these images and lab results as part of my medical decision-making.   EKG Interpretation   Date/Time:  Tuesday May 28 2016 17:20:27 EDT Ventricular Rate:  69 PR Interval:    QRS Duration: 101 QT Interval:  384 QTC Calculation: 412 R Axis:   61 Text Interpretation:  Sinus rhythm Borderline repolarization abnormality  Baseline wander When compared with ECG of 03/08/2014 No significant change  was found Confirmed by [facility]  MD, Nunzio Cory [phone]) on 05/28/2016 6:08:17  PM      MDM  MDM Reviewed: previous chart, nursing note and vitals Reviewed previous: labs and ECG Interpretation: labs, ECG, x-ray and CT scan   Results for orders placed or performed during the hospital encounter of 0000000  Basic metabolic panel  Result Value Ref Range   Sodium 137 135 - 145 mmol/L   Potassium 3.6 3.5 -  5.1 mmol/L   Chloride 102 101 - 111 mmol/L   CO2 24 22 - 32 mmol/L   Glucose, Bld 112 (H) 65 - 99 mg/dL   BUN 16 6 - 20 mg/dL   Creatinine, Ser 0.86 0.44 - 1.00 mg/dL   Calcium 9.2 8.9 - 10.3 mg/dL   GFR calc non Af Amer >60 >60 mL/min   GFR calc Af Amer >60 >60 mL/min   Anion gap 11 5 - 15  CBC  Result Value Ref Range   WBC 8.2 4.0 - 10.5 K/uL   RBC 5.42 (H) 3.87 - 5.11 MIL/uL   Hemoglobin 12.7 12.0 - 15.0 g/dL   HCT 40.9 36.0 - 46.0 %   MCV 75.5 (L) 78.0 - 100.0 fL   MCH 23.4 (L) 26.0 - 34.0 pg   MCHC 31.1 30.0 - 36.0 g/dL   RDW 15.5 11.5 - 15.5 %   Platelets 394 150 - 400 K/uL  Urinalysis, Routine w reflex microscopic  Result Value Ref Range   Color, Urine YELLOW YELLOW  APPearance CLEAR CLEAR   Specific Gravity, Urine 1.020 1.005 - 1.030   pH 6.0 5.0 - 8.0   Glucose, UA NEGATIVE NEGATIVE mg/dL   Hgb urine dipstick LARGE (A) NEGATIVE   Bilirubin Urine NEGATIVE NEGATIVE   Ketones, ur NEGATIVE NEGATIVE mg/dL   Protein, ur 30 (A) NEGATIVE mg/dL   Nitrite POSITIVE (A) NEGATIVE   Leukocytes, UA SMALL (A) NEGATIVE  Troponin I  Result Value Ref Range   Troponin I <0.03 <0.03 ng/mL  Pregnancy, urine  Result Value Ref Range   Preg Test, Ur NEGATIVE NEGATIVE  Urine microscopic-add on  Result Value Ref Range   Squamous Epithelial / LPF 0-5 (A) NONE SEEN   WBC, UA 6-30 0 - 5 WBC/hpf   RBC / HPF 6-30 0 - 5 RBC/hpf   Bacteria, UA MANY (A) NONE SEEN   Dg Chest 2 View 05/28/2016  CLINICAL DATA:  Generalized weakness for several months, worse over the last 2 days. EXAM: CHEST  2 VIEW COMPARISON:  Chest x-ray dated 03/08/2014. FINDINGS: Heart size is normal. Overall cardiomediastinal silhouette is normal in size and configuration. Lungs are clear. No evidence of pneumonia. No pleural effusion or pneumothorax seen. Mild degenerative spurring noted within the slightly kyphotic thoracic spine. No acute or suspicious osseous finding. IMPRESSION: No active cardiopulmonary disease.  No  evidence of pneumonia. Electronically Signed   By: Franki Cabot M.D.   On: 05/28/2016 18:34   Dg Lumbar Spine Complete 05/28/2016  CLINICAL DATA:  Generalized weakness for several months, worse over the last 2 days. No known injuries. EXAM: LUMBAR SPINE - COMPLETE 4+ VIEW COMPARISON:  None. FINDINGS: There is mild levoscoliosis centered at the thoracolumbar junction. Alignment is otherwise normal. Mild degenerative spurring noted within the lumbar spine. Additional anterior osteophyte formation noted in the lower thoracic spine. This at least mild degenerative hypertrophy amongst the posterior facets from L2-3 through L5-S1. No fracture line or displaced fracture fragment identified. No acute or suspicious osseous lesion. No evidence of pars interarticularis defect seen. Upper sacrum appears intact and normally aligned. Paravertebral soft tissues are unremarkable. IMPRESSION: 1. No acute findings. 2. Mild scoliosis. 3. Mild degenerative changes, as detailed above. Electronically Signed   By: Franki Cabot M.D.   On: 05/28/2016 18:36   Ct Head Wo Contrast 05/28/2016  CLINICAL DATA:  Progressive upper and lower extremity weakness and nausea EXAM: CT HEAD WITHOUT CONTRAST CT CERVICAL SPINE WITHOUT CONTRAST TECHNIQUE: Multidetector CT imaging of the head and cervical spine was performed following the standard protocol without intravenous contrast. Multiplanar CT image reconstructions of the cervical spine were also generated. COMPARISON:  None. FINDINGS: CT HEAD FINDINGS The ventricles are normal in size and configuration. There is no intracranial mass, hemorrhage, extra-axial fluid collection, or midline shift. Gray-white compartments are normal. No demonstrable acute infarct. No vascular calcifications are evident. The bony calvarium appears intact. The mastoid air cells are clear. Orbits appear symmetric bilaterally. Visualized paranasal sinuses are clear. CT CERVICAL SPINE FINDINGS There is no fracture or  spondylolisthesis. Prevertebral soft tissues and predental space regions are normal. There is mild disc space narrowing C5-6, C6-7, and C7-T1. There are anterior and posterior osteophytes at C5 and C6. There is also a prominent anterior osteophyte inferiorly at C4. There is calcification in the anterior longitudinal ligament at C4-5, C5-6, and C6-7. At C2-3, there is no appreciable nerve root edema or effacement. No disc extrusion or stenosis. At C3-4, there is no appreciable nerve root edema or effacement. No disc extrusion  or stenosis. At C4-5, there is mild facet hypertrophy bilaterally. No nerve root edema or effacement. No disc extrusion or stenosis. At C5-6, there is mild facet hypertrophy bilaterally. There is central disc bulging without nerve root edema or effacement evident. No nerve root edema or effacement. No disc extrusion or stenosis. At C6-7, there is mild facet hypertrophy bilaterally. There is central disc protrusion without nerve root edema or effacement. No disc extrusion or stenosis. At C7-T1, there is mild facet hypertrophy bilaterally. No nerve root edema or effacement. No disc extrusion or stenosis. IMPRESSION: CT head:  Study within normal limits. CT cervical spine: Osteoarthritic changes several levels. Central disc protrusion at C5-6 and C6-7 without appreciable stenosis. No disc extrusion or stenosis is apparent on this study. No nerve root edema or effacement is evident on this study. Electronically Signed   By: Lowella Grip III M.D.   On: 05/28/2016 18:27   Ct Cervical Spine Wo Contrast 05/28/2016  CLINICAL DATA:  Progressive upper and lower extremity weakness and nausea EXAM: CT HEAD WITHOUT CONTRAST CT CERVICAL SPINE WITHOUT CONTRAST TECHNIQUE: Multidetector CT imaging of the head and cervical spine was performed following the standard protocol without intravenous contrast. Multiplanar CT image reconstructions of the cervical spine were also generated. COMPARISON:  None.  FINDINGS: CT HEAD FINDINGS The ventricles are normal in size and configuration. There is no intracranial mass, hemorrhage, extra-axial fluid collection, or midline shift. Gray-white compartments are normal. No demonstrable acute infarct. No vascular calcifications are evident. The bony calvarium appears intact. The mastoid air cells are clear. Orbits appear symmetric bilaterally. Visualized paranasal sinuses are clear. CT CERVICAL SPINE FINDINGS There is no fracture or spondylolisthesis. Prevertebral soft tissues and predental space regions are normal. There is mild disc space narrowing C5-6, C6-7, and C7-T1. There are anterior and posterior osteophytes at C5 and C6. There is also a prominent anterior osteophyte inferiorly at C4. There is calcification in the anterior longitudinal ligament at C4-5, C5-6, and C6-7. At C2-3, there is no appreciable nerve root edema or effacement. No disc extrusion or stenosis. At C3-4, there is no appreciable nerve root edema or effacement. No disc extrusion or stenosis. At C4-5, there is mild facet hypertrophy bilaterally. No nerve root edema or effacement. No disc extrusion or stenosis. At C5-6, there is mild facet hypertrophy bilaterally. There is central disc bulging without nerve root edema or effacement evident. No nerve root edema or effacement. No disc extrusion or stenosis. At C6-7, there is mild facet hypertrophy bilaterally. There is central disc protrusion without nerve root edema or effacement. No disc extrusion or stenosis. At C7-T1, there is mild facet hypertrophy bilaterally. No nerve root edema or effacement. No disc extrusion or stenosis. IMPRESSION: CT head:  Study within normal limits. CT cervical spine: Osteoarthritic changes several levels. Central disc protrusion at C5-6 and C6-7 without appreciable stenosis. No disc extrusion or stenosis is apparent on this study. No nerve root edema or effacement is evident on this study. Electronically Signed   By: Lowella Grip III M.D.   On: 05/28/2016 18:27     1735:  Pt states she "can't lift" her RLE for strength testing, but when I asked her to bend her knee, she lifted her entire RLE off the stretcher and held it up before bending her knee. Pt also states she cannot perform LE's cerebellar testing, but she will cross both her LE's when I ask her to. Question true weakness vs poor effort.  2000:  Pt is not  orthostatic on VS. Will tx for UTI. Workup is otherwise reassuring. Pt denies change in usual weakness or new weakness. VS remain stable. Pt wants to go home now. Encouraged to f/u with Neuro MD (referred by PMD). Dx and testing d/w pt and family.  Questions answered.  Verb understanding, agreeable to d/c home with outpt f/u.    Francine Graven, DO 05/29/16 1940

## 2016-05-28 NOTE — Discharge Instructions (Signed)
Take the prescription as directed.  Call your regular medical doctor tomorrow to schedule a follow up appointment within the next 2 days. Call the Neurologist tomorrow to schedule a follow up appointment within the next week. Return to the Emergency Department immediately sooner if worsening.

## 2016-05-28 NOTE — ED Notes (Signed)
Pt reports weakness in arms and legs since October, pt reports having increased weakness in all limbs yesterday with nausea, trouble getting around.  Pt has pain in center of chest x1 week.  Pt does not have any cp at this time.

## 2017-02-17 ENCOUNTER — Ambulatory Visit (INDEPENDENT_AMBULATORY_CARE_PROVIDER_SITE_OTHER): Payer: BLUE CROSS/BLUE SHIELD | Admitting: Neurology

## 2017-02-17 ENCOUNTER — Encounter: Payer: Self-pay | Admitting: Neurology

## 2017-02-17 ENCOUNTER — Other Ambulatory Visit: Payer: Self-pay | Admitting: *Deleted

## 2017-02-17 VITALS — BP 120/66 | HR 70 | Ht 64.0 in | Wt 384.0 lb

## 2017-02-17 DIAGNOSIS — R351 Nocturia: Secondary | ICD-10-CM

## 2017-02-17 DIAGNOSIS — R269 Unspecified abnormalities of gait and mobility: Secondary | ICD-10-CM | POA: Insufficient documentation

## 2017-02-17 DIAGNOSIS — R32 Unspecified urinary incontinence: Secondary | ICD-10-CM | POA: Diagnosis not present

## 2017-02-17 DIAGNOSIS — E669 Obesity, unspecified: Secondary | ICD-10-CM

## 2017-02-17 HISTORY — DX: Unspecified abnormalities of gait and mobility: R26.9

## 2017-02-17 HISTORY — DX: Morbid (severe) obesity due to excess calories: E66.01

## 2017-02-17 MED ORDER — ALPRAZOLAM 0.5 MG PO TABS: 0.5000 mg | ORAL_TABLET | Freq: Every evening | ORAL | 0 refills | Status: DC | PRN

## 2017-02-17 NOTE — Progress Notes (Signed)
PATIENT: Erika Hanson DOB: Feb 21, 1966  Chief Complaint  Patient presents with  . Numbness/Abnormal Gait    Reports numbness in her bilateral feet and weakness in her lower extremities.  Symptoms have been present for 1.5 years and cause her gait difficulty.  She was treated in the ED at [facility] and saw neurology there in 2017.  States she was diagnosed with Functional Neurological Disorder.  Marland Kitchen PCP    Erika Cara, NP     [place] is on 51 years old right-handed female seen in refer by her primary care physician nurse practitioner Erika Hanson for evaluation of numbness tingling at feet, lower extremity, difficulty walking.  I reviewed and summarized referring note, she had a history of hypertension, vitamin D deficiency,  She had a history of frequent muscle spasm, urinary started on the right below breast thoracic level, goes all the way to her back, she also experienced gradual onset muscle spasm, grip her breathing, to the point of difficulty walking, certain movement will trigger her muscle spasm. Also spreading to her shoulder, low back.  Once she started moving about, her low back pain with much improved, but with the standing position, she has significant pain, she also complains of bilateral hands numbness tingling, multiple joints pain, right hand intermittent jerking, sometimes shoveled the food out of her plate.   Intermittent gait abnormality, difficulty picking up her right foot of the floor, loose balance easily, she began to fall in January 2018, she also complains of bowel and bladder incontinence, gradually getting worse since 2017, she has accident including bowel movement accident once a week.    because of of difficulties, she fell difficulty to continue her job, difficult to go out of her house, she complains of shooting pain at both plantar feet, as if she has raw blisters, radiating to her lower extremity, gabapentin was helpful.    She also complains of fatigue, could not tolerate the heat, phantom smell of cigarette smoking, intermittent whole-body tingling sensation, sweat profusely, the stinging sensation from bilateral knee down, sometimes spreading to whole-body, bilateral shin sensitivity, difficult to shave the hair at her legs.  She was seen by neurologist at [facility] in September 2017, following her ED presentation,   MRI of the brain on August 02 2016, few supratentorium small vessel disease. Normal MRA of the brain.  Laboratory evaluation in 2017, CPK was 69, creatinine was 0.94, normal liver function tests, mild elevated A1c 6.0, vitamin D level was decreased 14 point 9, normal vitamin B12 478, TSH 3.5, free T4 1.15.  REVIEW OF SYSTEMS: Full 14 system review of systems performed and notable only for blurred vision, loss of vision, wheezing, constipation, trouble swallowing, easy bruising, joint pain, cramps, depression  ALLERGIES: No Known Allergies  HOME MEDICATIONS: Current Outpatient Prescriptions  Medication Sig Dispense Refill  . albuterol (PROVENTIL HFA;VENTOLIN HFA) 108 (90 Base) MCG/ACT inhaler Inhale 1-2 puffs into the lungs every 6 (six) hours as needed for wheezing or shortness of breath.    . cholecalciferol (VITAMIN D) 1000 units tablet Take 2,000 Units by mouth daily.    . citalopram (CELEXA) 20 MG tablet Take 20 mg by mouth at bedtime.    . gabapentin (NEURONTIN) 300 MG capsule Take 300 mg by mouth 3 (three) times daily.    . hydrochlorothiazide (HYDRODIURIL) 25 MG tablet Take 25 mg by mouth daily.    Marland Kitchen losartan (COZAAR) 50 MG tablet Take 50 mg by mouth at  bedtime.    . naproxen (NAPROSYN) 500 MG tablet Take 500 mg by mouth 2 (two) times daily as needed.    Marland Kitchen omeprazole (PRILOSEC) 20 MG capsule Take 20 mg by mouth at bedtime.    Marland Kitchen oxybutynin (DITROPAN-XL) 5 MG 24 hr tablet Take 5 mg by mouth at bedtime.    Marland Kitchen tiZANidine (ZANAFLEX) 4 MG capsule Take 4 mg by mouth 3 (three) times daily  as needed for muscle spasms.     No current facility-administered medications for this visit.     PAST MEDICAL HISTORY: Past Medical History:  Diagnosis Date  . Acid reflux   . Depression   . Hypertension   . Insomnia   . Morbid obesity ([place])   . Muscle spasm   . Nocturia   . Numbness in feet   . Weakness     PAST SURGICAL HISTORY: Past Surgical History:  Procedure Laterality Date  . CESAREAN SECTION      FAMILY HISTORY: Family History  Problem Relation Age of Onset  . Hypertension Mother   . Depression Mother   . Alzheimer's disease Father   . Diabetes Father   . Hypertension Father     SOCIAL HISTORY:  Social History   Social History  . Marital status: Divorced    Spouse name: N/A  . Number of children: 1  . Years of education: HS   Occupational History  . Collects eggs    Social History Main Topics  . Smoking status: Never Smoker  . Smokeless tobacco: Never Used  . Alcohol use No  . Drug use: No  . Sexual activity: Yes    Birth control/ protection: None   Other Topics Concern  . Not on file   Social History Narrative   Lives at home with her son.   Right-handed.   8 cups tea and 1 can of [facility] per day.     PHYSICAL EXAM   Vitals:   02/17/17 0723  BP: 120/66  Pulse: 70  Weight: (!) 384 lb (174.2 kg)  Height: 5\' 4"  (1.626 m)    Not recorded      Body mass index is 65.91 kg/m.  PHYSICAL EXAMNIATION:  Gen: NAD, conversant, well nourised, obese, well groomed                     Cardiovascular: Regular rate rhythm, no peripheral edema, warm, nontender. Eyes: Conjunctivae clear without exudates or hemorrhage Neck: Supple, no carotid bruits. Pulmonary: Clear to auscultation bilaterally   NEUROLOGICAL EXAM:  MENTAL STATUS: Speech:    Speech is normal; fluent and spontaneous with normal comprehension.  Cognition:     Orientation to time, place and person     Normal recent and remote memory     Normal Attention span and  concentration     Normal Language, naming, repeating,spontaneous speech     Fund of knowledge   CRANIAL NERVES: CN II: Visual fields are full to confrontation. Fundoscopic exam is normal with sharp discs and no vascular changes. Pupils are round equal and briskly reactive to light. CN III, IV, VI: extraocular movement are normal. No ptosis. CN V: Facial sensation is intact to pinprick in all 3 divisions bilaterally. Corneal responses are intact.  CN VII: Face is symmetric with normal eye closure and smile. CN VIII: Hearing is normal to rubbing fingers CN IX, X: Palate elevates symmetrically. Phonation is normal. CN XI: Head turning and shoulder shrug are intact CN XII: Tongue is  midline with normal movements and no atrophy.  MOTOR: There is no pronator drift of out-stretched arms. Muscle bulk and tone are normal. Muscle strength is normal.  REFLEXES: Reflexes are 2+ and symmetric at the biceps, triceps, knees, and ankles. Plantar responses are flexor.  SENSORY: Intact to light touch, pinprick, positional sensation and vibratory sensation are intact in fingers and toes.  COORDINATION: Rapid alternating movements and fine finger movements are intact. There is no dysmetria on finger-to-nose and heel-knee-shin.    GAIT/STANCE: she need to push on chair arm to get up from seated position, cautious, mildly unsteady    ASSESSMENT AND PLAN  Erika Hanson is a 51 y.o. female   constellation of complaints including upper back pain, for extremity paresthesia, bowel and bladder incontinence   differentiation diagnosis including cervical/thoracic myelopathy,  She also complains of low back pain,  Proceed with laboratory evaluations,  MRI of cervical, lumbar thoracic spine   Marcial Pacas, M.D. Ph.D.  [facility] [address] [place], [place] [address] Ph: [phone] Fax: [phone]  HK:[id number] Frederico Hamman, NP

## 2017-02-19 LAB — COMPREHENSIVE METABOLIC PANEL
ALK PHOS: 132 IU/L — AB (ref 39–117)
ALT: 19 IU/L (ref 0–32)
AST: 13 IU/L (ref 0–40)
Albumin/Globulin Ratio: 1.5 (ref 1.2–2.2)
Albumin: 4.1 g/dL (ref 3.5–5.5)
BUN/Creatinine Ratio: 18 (ref 9–23)
BUN: 15 mg/dL (ref 6–24)
Bilirubin Total: <0.2 mg/dL (ref 0.0–1.2)
CO2: 27 mmol/L (ref 18–29)
CREATININE: 0.85 mg/dL (ref 0.57–1.00)
Calcium: 9.4 mg/dL (ref 8.7–10.2)
Chloride: 96 mmol/L (ref 96–106)
GFR calc Af Amer: 94 mL/min/{1.73_m2} (ref >59)
GFR calc non Af Amer: 81 mL/min/{1.73_m2} (ref >59)
GLOBULIN, TOTAL: 2.7 g/dL (ref 1.5–4.5)
GLUCOSE: 123 mg/dL — AB (ref 65–99)
Potassium: 4.4 mmol/L (ref 3.5–5.2)
SODIUM: 140 mmol/L (ref 134–144)
Total Protein: 6.8 g/dL (ref 6.0–8.5)

## 2017-02-19 LAB — CBC
Hematocrit: 35.9 % (ref 34.0–46.6)
Hemoglobin: 11.1 g/dL (ref 11.1–15.9)
MCH: 23.4 pg — ABNORMAL LOW (ref 26.6–33.0)
MCHC: 30.9 g/dL — AB (ref 31.5–35.7)
MCV: 76 fL — ABNORMAL LOW (ref 79–97)
PLATELETS: 300 10*3/uL (ref 150–379)
RBC: 4.75 x10E6/uL (ref 3.77–5.28)
RDW: 16.6 % — AB (ref 12.3–15.4)
WBC: 7.2 10*3/uL (ref 3.4–10.8)

## 2017-02-19 LAB — VITAMIN B12: Vitamin B-12: 431 pg/mL (ref 232–1245)

## 2017-02-19 LAB — HEMOGLOBIN A1C
ESTIMATED AVERAGE GLUCOSE: 134 mg/dL
HEMOGLOBIN A1C: 6.3 % — AB (ref 4.8–5.6)

## 2017-02-19 LAB — COPPER, SERUM: Copper: 145 ug/dL (ref 72–166)

## 2017-02-19 LAB — SEDIMENTATION RATE: SED RATE: 9 mm/h (ref 0–32)

## 2017-02-19 LAB — ANA W/REFLEX IF POSITIVE: Anti Nuclear Antibody(ANA): NEGATIVE

## 2017-02-19 LAB — C-REACTIVE PROTEIN: CRP: 6.2 mg/L — AB (ref 0.0–4.9)

## 2017-02-19 LAB — CK: CK TOTAL: 100 U/L (ref 24–173)

## 2017-02-19 LAB — TSH: TSH: 4.06 u[IU]/mL (ref 0.450–4.500)

## 2017-02-20 ENCOUNTER — Telehealth: Payer: Self-pay | Admitting: Neurology

## 2017-02-20 NOTE — Telephone Encounter (Signed)
MRI cervical,  MRI lumbar Valid through April 27  [id number]

## 2017-02-20 NOTE — Telephone Encounter (Signed)
[employer] only approved MR Lumbar not the Thoracic or cervical.. Since they are all together they would not give me the authorization number.. The phone number for the peer to peer is [phone] and the member ID is [id number] & DOB is 1966/04/16.Marland Kitchen The case does close on 02/24/17. Thank you for your help!

## 2017-02-20 NOTE — Telephone Encounter (Signed)
Thank you for your help! Did you withdraw the MRI Thoracic spine??

## 2017-02-21 NOTE — Telephone Encounter (Signed)
MRI thoracic was denied by her insurance company at current stage.

## 2017-02-24 NOTE — Telephone Encounter (Signed)
Noted, thank you for your help!  °

## 2017-02-25 ENCOUNTER — Telehealth: Payer: Self-pay | Admitting: Neurology

## 2017-02-25 NOTE — Telephone Encounter (Signed)
Spoke to patient - she is aware of results and will follow up with PCP.

## 2017-02-25 NOTE — Telephone Encounter (Signed)
Please call patient, laboratory showed elevated A1c 6.3, otherwise no significant abnormality on rest of the laboratory evaluations.

## 2017-03-10 ENCOUNTER — Ambulatory Visit
Admission: RE | Admit: 2017-03-10 | Discharge: 2017-03-10 | Disposition: A | Discharge status: Home / Self Care | Payer: Self-pay | Source: Ambulatory Visit | Attending: Neurology | Admitting: Neurology

## 2017-03-10 ENCOUNTER — Ambulatory Visit
Admission: RE | Admit: 2017-03-10 | Discharge: 2017-03-10 | Disposition: A | Discharge status: Home / Self Care | Payer: BLUE CROSS/BLUE SHIELD | Source: Ambulatory Visit | Attending: Neurology | Admitting: Neurology

## 2017-03-10 ENCOUNTER — Other Ambulatory Visit: Payer: Self-pay

## 2017-03-10 DIAGNOSIS — R32 Unspecified urinary incontinence: Secondary | ICD-10-CM

## 2017-03-10 DIAGNOSIS — R269 Unspecified abnormalities of gait and mobility: Secondary | ICD-10-CM

## 2017-03-10 DIAGNOSIS — E669 Obesity, unspecified: Secondary | ICD-10-CM

## 2017-03-24 ENCOUNTER — Ambulatory Visit (INDEPENDENT_AMBULATORY_CARE_PROVIDER_SITE_OTHER): Payer: BLUE CROSS/BLUE SHIELD | Admitting: Neurology

## 2017-03-24 ENCOUNTER — Encounter: Payer: Self-pay | Admitting: Neurology

## 2017-03-24 VITALS — BP 154/77 | HR 72 | Ht 64.0 in | Wt >= 6400 oz

## 2017-03-24 DIAGNOSIS — M546 Pain in thoracic spine: Secondary | ICD-10-CM | POA: Insufficient documentation

## 2017-03-24 DIAGNOSIS — R269 Unspecified abnormalities of gait and mobility: Secondary | ICD-10-CM

## 2017-03-24 DIAGNOSIS — G8929 Other chronic pain: Secondary | ICD-10-CM

## 2017-03-24 DIAGNOSIS — R32 Unspecified urinary incontinence: Secondary | ICD-10-CM | POA: Diagnosis not present

## 2017-03-24 DIAGNOSIS — E669 Obesity, unspecified: Secondary | ICD-10-CM | POA: Diagnosis not present

## 2017-03-24 HISTORY — DX: Pain in thoracic spine: M54.6

## 2017-03-24 MED ORDER — DULOXETINE HCL 60 MG PO CPEP: 60.0000 mg | ORAL_CAPSULE | Freq: Every day | ORAL | 11 refills | Status: DC

## 2017-03-24 MED ORDER — TOPIRAMATE 100 MG PO TABS: 200.0000 mg | ORAL_TABLET | Freq: Two times a day (BID) | ORAL | 11 refills | Status: DC

## 2017-03-24 NOTE — Progress Notes (Signed)
  PATIENT: Erika Hanson DOB: 07/29/1966  Chief Complaint  Patient presents with  . Back Pain/Paresthesia    She would like to discuss her MRI results.     HISTORICAL  Erika Hanson is on 51 years old right-handed female seen in refer by her primary care physician nurse practitioner Nicole Spencer for evaluation of numbness tingling at feet, lower extremity, difficulty walking.Initial evaluation was on February 17 2017.  I reviewed and summarized referring note, she had a history of hypertension, vitamin D deficiency,  She had a history of frequent muscle spasm, urinary started on the right below breast thoracic level, goes all the way to her back, she also experienced gradual onset muscle spasm, grip her breathing, to the point of difficulty walking, certain movement will trigger her muscle spasm. Also spreading to her shoulder, low back.  Once she started moving about, her low back pain with much improved, but with the standing position, she has significant pain, she also complains of bilateral hands numbness tingling, multiple joints pain, right hand intermittent jerking, sometimes shoveled the food out of her plate.   Intermittent gait abnormality, difficulty picking up her right foot of the floor, loose balance easily, she began to fall in January 2018, she also complains of bowel and bladder incontinence, gradually getting worse since 2017, she has accident including bowel movement accident once a week.    because of of difficulties, she fell difficulty to continue her job, difficult to go out of her house, she complains of shooting pain at both plantar feet, as if she has raw blisters, radiating to her lower extremity, gabapentin was helpful.   She also complains of fatigue, could not tolerate the heat, phantom smell of cigarette smoking, intermittent whole-body tingling sensation, sweat profusely, the stinging sensation from bilateral knee down, sometimes spreading to  whole-body, bilateral shin sensitivity, difficult to shave the hair at her legs.  She was seen by neurologist at [place] [facility] in September 2017, following her ED presentation,   MRI of the brain on August 02 2016, few supratentorium small vessel disease. Normal MRA of the brain.  Laboratory evaluation in 2017, CPK was 69, creatinine was 0.94, normal liver function tests, mild elevated A1c 6.0, vitamin D level was decreased 14 point 9, normal vitamin B12 478, TSH 3.5, free T4 1.15.  Update March 24 2017: She continue complains of intermittent bilateral lower extremity swelling, numbness tingling especially after prolonged sitting of bearing weight, also worsening bowel and urinary urgency, about once a week she would have either bowel or urinary accident,  She also complains of intermittent sharp radiating pain from right lower thoracic around her chest, but have personally reviewed MRI of the thoracic spine, there is multilevel degenerative disc disease, most severe C5-6 C6 and 7, due to left paramedian disc protrusion, there is flattening of the ventral surface, but no evidence of cervical cord compression, no cervical root compression. She also complains of intermittent bilateral lower extremity paresthesia, right arm numbness tingling,   MRI of lumbar spine showed multilevel degenerative disc disease, mild stenosis at L3-4 level, L4-5, there is evidence of facet joint disease, no significant canal or foraminal stenosis. Large left renal cyst.  Was able to review laboratory evaluation in 2018, normal C-reactive protein, copper level, negative ANA, normal B12 431, A1c was elevated 6.3, normal CPK, TSH, ESR, CBC was mildly elevated with hemoglobin of 11 point 1, CMP was normal with the exception of elevated glucose 123, creatinine of 0.85,    She complained of difficulty lying flat at nighttime, wheezing, failure to relieve by inhaler,   REVIEW OF SYSTEMS: Full 14 system review of systems performed  and notable only for cough, wheezing, shortness of breath   ALLERGIES: No Known Allergies  HOME MEDICATIONS: Current Outpatient Prescriptions  Medication Sig Dispense Refill  . albuterol (PROVENTIL HFA;VENTOLIN HFA) 108 (90 Base) MCG/ACT inhaler Inhale 1-2 puffs into the lungs every 6 (six) hours as needed for wheezing or shortness of breath.    . ALPRAZolam (XANAX) 0.5 MG tablet Take 1 tablet (0.5 mg total) by mouth at bedtime as needed for anxiety. As needed for MRI 3 tablet 0  . cholecalciferol (VITAMIN D) 1000 units tablet Take 2,000 Units by mouth daily.    . citalopram (CELEXA) 20 MG tablet Take 20 mg by mouth at bedtime.    . gabapentin (NEURONTIN) 300 MG capsule Take 300 mg by mouth 3 (three) times daily.    . hydrochlorothiazide (HYDRODIURIL) 25 MG tablet Take 25 mg by mouth daily.    . losartan (COZAAR) 50 MG tablet Take 50 mg by mouth at bedtime.    . naproxen (NAPROSYN) 500 MG tablet Take 500 mg by mouth 2 (two) times daily as needed.    . omeprazole (PRILOSEC) 20 MG capsule Take 20 mg by mouth at bedtime.    . oxybutynin (DITROPAN-XL) 5 MG 24 hr tablet Take 5 mg by mouth at bedtime.    . tiZANidine (ZANAFLEX) 4 MG capsule Take 4 mg by mouth 3 (three) times daily as needed for muscle spasms.     No current facility-administered medications for this visit.     PAST MEDICAL HISTORY: Past Medical History:  Diagnosis Date  . Acid reflux   . Depression   . Hypertension   . Insomnia   . Morbid obesity (HCC)   . Muscle spasm   . Nocturia   . Numbness in feet   . Weakness     PAST SURGICAL HISTORY: Past Surgical History:  Procedure Laterality Date  . CESAREAN SECTION      FAMILY HISTORY: Family History  Problem Relation Age of Onset  . Hypertension Mother   . Depression Mother   . Alzheimer's disease Father   . Diabetes Father   . Hypertension Father     SOCIAL HISTORY:  Social History   Social History  . Marital status: Divorced    Spouse name: N/A  .  Number of children: 1  . Years of education: HS   Occupational History  . Collects eggs    Social History Main Topics  . Smoking status: Never Smoker  . Smokeless tobacco: Never Used  . Alcohol use No  . Drug use: No  . Sexual activity: Yes    Birth control/ protection: None   Other Topics Concern  . Not on file   Social History Narrative   Lives at home with her son.   Right-handed.   8 cups tea and 1 can of Mountain Dew per day.     PHYSICAL EXAM   Vitals:   03/24/17 1541  BP: (!) 154/77  Pulse: 72  Weight: (!) 401 lb 8 oz (182.1 kg)  Height: 5' 4" (1.626 m)    Not recorded      Body mass index is 68.92 kg/m.  PHYSICAL EXAMNIATION:  Gen: NAD, conversant, well nourised, obese, well groomed                     Cardiovascular:   Regular rate rhythm, no peripheral edema, warm, nontender. Eyes: Conjunctivae clear without exudates or hemorrhage Neck: Supple, no carotid bruits. Pulmonary: Clear to auscultation bilaterally   NEUROLOGICAL EXAM:  MENTAL STATUS: Speech:    Speech is normal; fluent and spontaneous with normal comprehension.  Cognition:     Orientation to time, place and person     Normal recent and remote memory     Normal Attention span and concentration     Normal Language, naming, repeating,spontaneous speech     Fund of knowledge   CRANIAL NERVES: CN II: Visual fields are full to confrontation. Fundoscopic exam is normal with sharp discs and no vascular changes. Pupils are round equal and briskly reactive to light. CN III, IV, VI: extraocular movement are normal. No ptosis. CN V: Facial sensation is intact to pinprick in all 3 divisions bilaterally. Corneal responses are intact.  CN VII: Face is symmetric with normal eye closure and smile. CN VIII: Hearing is normal to rubbing fingers CN IX, X: Palate elevates symmetrically. Phonation is normal. CN XI: Head turning and shoulder shrug are intact CN XII: Tongue is midline with normal  movements and no atrophy.  MOTOR: There is no pronator drift of out-stretched arms. Muscle bulk and tone are normal. Muscle strength is normal.  REFLEXES: Reflexes are 2+ and symmetric at the biceps, triceps, knees, and ankles. Plantar responses are flexor.  SENSORY: Intact to light touch, pinprick, positional sensation and vibratory sensation are intact in fingers and toes.  COORDINATION: Rapid alternating movements and fine finger movements are intact. There is no dysmetria on finger-to-nose and heel-knee-shin.    GAIT/STANCE: she need to push on chair arm to get up from seated position, cautious, mildly unsteady    ASSESSMENT AND PLAN  Erika Hanson is a 50 y.o. female   Radiating pain along her thoracic spine, urinary and bowel incontinence  MRI of the thoracic spine to rule out structural lesions.   bilateral feet paresthesia, right hand paresthesia  EMG nerve conduction study to evaluate for possible peripheral neuropathy, right carpal tunnel syndromes,  Stopped gabapentin to avoid the excessive weight gain, tried topiramate 200 mg every night for neuropathic pain,  Cymbalta 60 mg daily    , M.D. Ph.D.  [facility] [address], [address] [place], [place] [address] Ph: [phone] Fax: [phone]  CC:Nicole Spencer, NP 

## 2017-04-13 ENCOUNTER — Ambulatory Visit
Admission: RE | Admit: 2017-04-13 | Discharge: 2017-04-13 | Disposition: A | Discharge status: Home / Self Care | Payer: BLUE CROSS/BLUE SHIELD | Source: Ambulatory Visit | Attending: Neurology | Admitting: Neurology

## 2017-04-13 DIAGNOSIS — R32 Unspecified urinary incontinence: Secondary | ICD-10-CM | POA: Diagnosis not present

## 2017-04-13 DIAGNOSIS — G8929 Other chronic pain: Secondary | ICD-10-CM

## 2017-04-13 DIAGNOSIS — E669 Obesity, unspecified: Secondary | ICD-10-CM

## 2017-04-13 DIAGNOSIS — M546 Pain in thoracic spine: Secondary | ICD-10-CM

## 2017-04-13 DIAGNOSIS — R269 Unspecified abnormalities of gait and mobility: Secondary | ICD-10-CM

## 2017-05-09 ENCOUNTER — Ambulatory Visit (INDEPENDENT_AMBULATORY_CARE_PROVIDER_SITE_OTHER): Payer: BLUE CROSS/BLUE SHIELD | Admitting: Neurology

## 2017-05-09 DIAGNOSIS — E669 Obesity, unspecified: Secondary | ICD-10-CM | POA: Diagnosis not present

## 2017-05-09 DIAGNOSIS — R269 Unspecified abnormalities of gait and mobility: Secondary | ICD-10-CM

## 2017-05-09 DIAGNOSIS — M546 Pain in thoracic spine: Secondary | ICD-10-CM

## 2017-05-09 DIAGNOSIS — G8929 Other chronic pain: Secondary | ICD-10-CM

## 2017-05-09 DIAGNOSIS — R32 Unspecified urinary incontinence: Secondary | ICD-10-CM

## 2017-05-09 DIAGNOSIS — R202 Paresthesia of skin: Secondary | ICD-10-CM

## 2017-05-09 NOTE — Procedures (Signed)
Full Name: Erika Hanson Gender: Female MRN #: [id number] Date of Birth: 02/17/2066    Visit Date: 05/09/17 09:59 Age: 51 Years 31 Months Old Examining Physician: Marcial Pacas, MD  Referring Physician: Krista Blue, MD History: 51 year old female with obesity, intermittent bilateral hands paresthesia, generalized weakness  Summary of the test: Nerve conduction study: Right peroneal, sural sensory responses were normal. Right median sensory responses showed mildly prolonged peak latency with preserved snap amplitude. Right ulnar sensory responses were normal. Left median sensory responses were within normal limits.  Right tibial and peroneal to EDB motor responses were normal. Right ulnar motor responses were normal. Left median motor responses were normal. Right median motor response showed mildly prolonged distal latency.  Electromyography: Selective needle examinations were performed at right lower extremity muscles, right lumbar sacral paraspinal muscles. There was no significant abnormality noted.   Conclusion: This is a mild abnormal study. There is electrodiagnostic evidence of moderate median neuropathy across the wrist, consistent with moderate right carpal tunnel syndromes. There is no evidence of large fiber peripheral neuropathy, or right lumbosacral radiculopathy.    ------------------------------- Marcial Pacas, M.D.  [facility] [place], [place] [address] Tel: [phone] Fax: [phone]        [facility]    Nerve / Sites Muscle Latency Ref. Amplitude Ref. Rel Amp Segments Distance Velocity Ref. Area    ms ms mV mV %  cm m/s m/s mVms  R Median - APB     Wrist APB 4.5 ?4.4 7.7 ?4.0 100 Wrist - APB 7   35.2     Upper arm APB 8.5  7.4  96.1 Upper arm - Wrist 24 60 ?49 33.5  L Median - APB     Wrist APB 3.3 ?4.4 10.1 ?4.0 100 Wrist - APB 7   44.5     Upper arm APB 7.2  9.9  97.6 Upper arm - Wrist 24 62 ?49 40.2  R Ulnar - ADM     Wrist ADM  2.8 ?3.3 9.5 ?6.0 100 Wrist - ADM 7   36.2     B.Elbow ADM 5.8  9.6  101 B.Elbow - Wrist 17 56 ?49 34.4     A.Elbow ADM 7.2  9.4  98.2 A.Elbow - B.Elbow 8 57 ?49 34.9         A.Elbow - Wrist      R Peroneal - EDB     Ankle EDB 4.6 ?6.5 3.7 ?2.0 100 Ankle - EDB 9   11.5     Fib head EDB NR  NR  NR Fib head - Ankle   ?44 NR     Pop fossa EDB 11.7  3.0   Pop fossa - Fib head   ?44 9.5         Pop fossa - Ankle 37 53    R Tibial - AH     Ankle AH 4.0 ?5.8 6.5 ?4.0 100 Ankle - AH 9   19.2     Pop fossa AH 12.7  2.7  41.2 Pop fossa - Ankle 37 43 ?41 8.5               SNC    Nerve / Sites Rec. Site Peak Lat Ref.  Amp Ref. Segments Distance    ms ms V V  cm  R Radial - Anatomical snuff box (Forearm)     Forearm Wrist 2.5 ?2.9 16 ?15 Forearm - Wrist 10  R Sural -  Ankle (Calf)     Calf Ankle 3.3 ?4.4 7 ?6 Calf - Ankle 14  R Superficial peroneal - Ankle     Lat leg Ankle NR ?4.4 NR ?6 Lat leg - Ankle 14  R Median - Orthodromic (Dig II, Mid palm)     Dig II Wrist 3.9 ?3.4 16 ?10 Dig II - Wrist 13  L Median - Orthodromic (Dig II, Mid palm)     Dig II Wrist 2.8 ?3.4 10 ?10 Dig II - Wrist 13  R Ulnar - Orthodromic, (Dig V, Mid palm)     Dig V Wrist 2.6 ?3.1 8 ?5 Dig V - Wrist 70                 F  Wave    Nerve F Lat Ref.   ms ms  R Ulnar - ADM 25.5 ?32.0  R Tibial - AH 49.2 ?56.0         EMG full       EMG Summary Table    Spontaneous MUAP Recruitment  Muscle IA Fib PSW Fasc Other Amp Dur. Poly Pattern  R. Tibialis anterior Normal None None None _______ Normal Normal Normal Normal  R. Gastrocnemius (Medial head) Normal None None None _______ Normal Normal Normal Normal  R. Vastus lateralis Normal None None None _______ Normal Normal Normal Normal  R. Lumbar paraspinals (low) Normal None None None _______ Normal Normal Normal Normal  R. Lumbar paraspinals (mid) Normal None None None _______ Normal Normal Normal Normal

## 2017-05-09 NOTE — Progress Notes (Signed)
PATIENT: Erika Hanson DOB: Sep 22, 1966  No chief complaint on file.    [name] is on 51 years old right-handed female seen in refer by her primary care physician nurse practitioner [name] for evaluation of numbness tingling at feet, lower extremity, difficulty walking.Initial evaluation was on February 17 2017.  I reviewed and summarized referring note, she had a history of hypertension, vitamin D deficiency,  She had a history of frequent muscle spasm, urinary started on the right below breast thoracic level, goes all the way to her back, she also experienced gradual onset muscle spasm, grip her breathing, to the point of difficulty walking, certain movement will trigger her muscle spasm. Also spreading to her shoulder, low back.  Once she started moving about, her low back pain with much improved, but with the standing position, she has significant pain, she also complains of bilateral hands numbness tingling, multiple joints pain, right hand intermittent jerking, sometimes shoveled the food out of her plate.   Intermittent gait abnormality, difficulty picking up her right foot of the floor, loose balance easily, she began to fall in January 2018, she also complains of bowel and bladder incontinence, gradually getting worse since 2017, she has accident including bowel movement accident once a week.    because of of difficulties, she fell difficulty to continue her job, difficult to go out of her house, she complains of shooting pain at both plantar feet, as if she has raw blisters, radiating to her lower extremity, gabapentin was helpful.   She also complains of fatigue, could not tolerate the heat, phantom smell of cigarette smoking, intermittent whole-body tingling sensation, sweat profusely, the stinging sensation from bilateral knee down, sometimes spreading to whole-body, bilateral shin sensitivity, difficult to shave the hair at her legs.  She was  seen by neurologist at [facility] in September 2017, following her ED presentation,   MRI of the brain on August 02 2016, few supratentorium small vessel disease. Normal MRA of the brain.  Laboratory evaluation in 2017, CPK was 69, creatinine was 0.94, normal liver function tests, mild elevated A1c 6.0, vitamin D level was decreased 14 point 9, normal vitamin B12 478, TSH 3.5, free T4 1.15.  Update March 24 2017: She continue complains of intermittent bilateral lower extremity swelling, numbness tingling especially after prolonged sitting of bearing weight, also worsening bowel and urinary urgency, about once a week she would have either bowel or urinary accident,  She also complains of intermittent sharp radiating pain from right lower thoracic around her chest, but have personally reviewed MRI of the thoracic spine, there is multilevel degenerative disc disease, most severe C5-6 C6 and 7, due to left paramedian disc protrusion, there is flattening of the ventral surface, but no evidence of cervical cord compression, no cervical root compression. She also complains of intermittent bilateral lower extremity paresthesia, right arm numbness tingling,   MRI of lumbar spine showed multilevel degenerative disc disease, mild stenosis at L3-4 level, L4-5, there is evidence of facet joint disease, no significant canal or foraminal stenosis. Large left renal cyst.  Was able to review laboratory evaluation in 2018, normal C-reactive protein, copper level, negative ANA, normal B12 431, A1c was elevated 6.3, normal CPK, TSH, ESR, CBC was mildly elevated with hemoglobin of 11 point 1, CMP was normal with the exception of elevated glucose 123, creatinine of 0.85,  She complained of difficulty lying flat at nighttime, wheezing, failure to relieve by inhaler,   UPDATE  May 09 2017: We have personally reviewed MRI of the thoracic spine on Apr 13 2017, there was no significant abnormality.  Laboratory  evaluations in March 2018, slight elevated C reactive protein 6.2, CMP show mild elevated glucose 123, CBC showed hemoglobin of 11.1, normal ESR and 9, TSH, CPK 100, A1c 6.3,  She is very frustrated at her symptoms, woke up in the middle night complains numbness tingling throughout her body, she is able to tolerate Topamax 100 mg every night,  Electrodiagnostic study today showed moderate right carpal tunnel syndromes.   REVIEW OF SYSTEMS: Full 14 system review of systems performed and notable only for as above  ALLERGIES: No Known Allergies  HOME MEDICATIONS: Current Outpatient Prescriptions  Medication Sig Dispense Refill  . albuterol (PROVENTIL HFA;VENTOLIN HFA) 108 (90 Base) MCG/ACT inhaler Inhale 1-2 puffs into the lungs every 6 (six) hours as needed for wheezing or shortness of breath.    . ALPRAZolam (XANAX) 0.5 MG tablet Take 1 tablet (0.5 mg total) by mouth at bedtime as needed for anxiety. As needed for MRI 3 tablet 0  . cholecalciferol (VITAMIN D) 1000 units tablet Take 2,000 Units by mouth daily.    . citalopram (CELEXA) 20 MG tablet Take 20 mg by mouth at bedtime.    . DULoxetine (CYMBALTA) 60 MG capsule Take 1 capsule (60 mg total) by mouth daily. 30 capsule 11  . gabapentin (NEURONTIN) 300 MG capsule Take 300 mg by mouth 3 (three) times daily.    . hydrochlorothiazide (HYDRODIURIL) 25 MG tablet Take 25 mg by mouth daily.    Marland Kitchen losartan (COZAAR) 50 MG tablet Take 50 mg by mouth at bedtime.    . naproxen (NAPROSYN) 500 MG tablet Take 500 mg by mouth 2 (two) times daily as needed.    Marland Kitchen omeprazole (PRILOSEC) 20 MG capsule Take 20 mg by mouth at bedtime.    Marland Kitchen oxybutynin (DITROPAN-XL) 5 MG 24 hr tablet Take 5 mg by mouth at bedtime.    Marland Kitchen tiZANidine (ZANAFLEX) 4 MG capsule Take 4 mg by mouth 3 (three) times daily as needed for muscle spasms.    Marland Kitchen topiramate (TOPAMAX) 100 MG tablet Take 2 tablets (200 mg total) by mouth 2 (two) times daily. 60 tablet 11   No current  facility-administered medications for this visit.     PAST MEDICAL HISTORY: Past Medical History:  Diagnosis Date  . Acid reflux   . Depression   . Hypertension   . Insomnia   . Morbid obesity ([place])   . Muscle spasm   . Nocturia   . Numbness in feet   . Weakness     PAST SURGICAL HISTORY: Past Surgical History:  Procedure Laterality Date  . CESAREAN SECTION      FAMILY HISTORY: Family History  Problem Relation Age of Onset  . Hypertension Mother   . Depression Mother   . Alzheimer's disease Father   . Diabetes Father   . Hypertension Father     SOCIAL HISTORY:  Social History   Social History  . Marital status: Divorced    Spouse name: N/A  . Number of children: 1  . Years of education: HS   Occupational History  . Collects eggs    Social History Main Topics  . Smoking status: Never Smoker  . Smokeless tobacco: Never Used  . Alcohol use No  . Drug use: No  . Sexual activity: Yes    Birth control/ protection: None   Other Topics Concern  .  Not on file   Social History Narrative   Lives at home with her son.   Right-handed.   8 cups tea and 1 can of [facility] per day.     PHYSICAL EXAM   There were no vitals filed for this visit.  Not recorded      There is no height or weight on file to calculate BMI.  PHYSICAL EXAMNIATION:  Gen: NAD, conversant, well nourised, obese, well groomed                     Cardiovascular: Regular rate rhythm, no peripheral edema, warm, nontender. Eyes: Conjunctivae clear without exudates or hemorrhage Neck: Supple, no carotid bruits. Pulmonary: Clear to auscultation bilaterally   NEUROLOGICAL EXAM:  MENTAL STATUS: Speech:    Speech is normal; fluent and spontaneous with normal comprehension.  Cognition:     Orientation to time, place and person     Normal recent and remote memory     Normal Attention span and concentration     Normal Language, naming, repeating,spontaneous speech     Fund of  knowledge   CRANIAL NERVES: CN II: Visual fields are full to confrontation. Fundoscopic exam is normal with sharp discs and no vascular changes. Pupils are round equal and briskly reactive to light. CN III, IV, VI: extraocular movement are normal. No ptosis. CN V: Facial sensation is intact to pinprick in all 3 divisions bilaterally. Corneal responses are intact.  CN VII: Face is symmetric with normal eye closure and smile. CN VIII: Hearing is normal to rubbing fingers CN IX, X: Palate elevates symmetrically. Phonation is normal. CN XI: Head turning and shoulder shrug are intact CN XII: Tongue is midline with normal movements and no atrophy.  MOTOR: There is no pronator drift of out-stretched arms. Muscle bulk and tone are normal. Muscle strength is normal.  REFLEXES: Reflexes are 2+ and symmetric at the biceps, triceps, knees, and ankles. Plantar responses are flexor.  SENSORY: Intact to light touch, pinprick, positional sensation and vibratory sensation are intact in fingers and toes.  COORDINATION: Rapid alternating movements and fine finger movements are intact. There is no dysmetria on finger-to-nose and heel-knee-shin.    GAIT/STANCE: she need to push on chair arm to get up from seated position, cautious, mildly unsteady    ASSESSMENT AND PLAN  Erika Hanson is a 51 y.o. female   Radiating pain along her thoracic spine, urinary and bowel incontinence  Most consistent with musculoskeletal etiology  Bilateral feet paresthesia, right hand paresthesia  EMG nerve conduction study showed evidence of moderate right carpal tunnel syndromes  Stopped gabapentin to avoid the excessive weight gain, tried topiramate 200 mg every night for neuropathic pain,  Cymbalta 60 mg daily  Refer her to physical therapy   Marcial Pacas, M.D. Ph.D.  [facility] [address] [place], [place] [address] Ph: [phone] Fax: [phone]  OK:[id number] Frederico Hamman, NP

## 2017-05-21 ENCOUNTER — Encounter: Payer: Self-pay | Admitting: Family Medicine

## 2017-05-21 ENCOUNTER — Other Ambulatory Visit: Payer: Self-pay | Admitting: Family Medicine

## 2017-05-21 ENCOUNTER — Ambulatory Visit (INDEPENDENT_AMBULATORY_CARE_PROVIDER_SITE_OTHER): Payer: BLUE CROSS/BLUE SHIELD | Admitting: Family Medicine

## 2017-05-21 VITALS — BP 134/72 | HR 70 | Temp 98.3°F | Resp 14 | Ht 64.0 in | Wt 367.0 lb

## 2017-05-21 DIAGNOSIS — E559 Vitamin D deficiency, unspecified: Secondary | ICD-10-CM | POA: Insufficient documentation

## 2017-05-21 DIAGNOSIS — R7303 Prediabetes: Secondary | ICD-10-CM | POA: Diagnosis not present

## 2017-05-21 DIAGNOSIS — E669 Obesity, unspecified: Secondary | ICD-10-CM | POA: Diagnosis not present

## 2017-05-21 DIAGNOSIS — IMO0001 Reserved for inherently not codable concepts without codable children: Secondary | ICD-10-CM

## 2017-05-21 DIAGNOSIS — R269 Unspecified abnormalities of gait and mobility: Secondary | ICD-10-CM

## 2017-05-21 DIAGNOSIS — R32 Unspecified urinary incontinence: Secondary | ICD-10-CM

## 2017-05-21 DIAGNOSIS — F447 Conversion disorder with mixed symptom presentation: Secondary | ICD-10-CM

## 2017-05-21 DIAGNOSIS — I10 Essential (primary) hypertension: Secondary | ICD-10-CM | POA: Diagnosis not present

## 2017-05-21 DIAGNOSIS — K5900 Constipation, unspecified: Secondary | ICD-10-CM

## 2017-05-21 DIAGNOSIS — Z6841 Body Mass Index (BMI) 40.0 and over, adult: Secondary | ICD-10-CM

## 2017-05-21 HISTORY — DX: Essential (primary) hypertension: I10

## 2017-05-21 MED ORDER — LOSARTAN POTASSIUM 50 MG PO TABS: 50.0000 mg | ORAL_TABLET | Freq: Every day | ORAL | 6 refills | Status: DC

## 2017-05-21 MED ORDER — TIZANIDINE HCL 4 MG PO CAPS: 4.0000 mg | ORAL_CAPSULE | Freq: Three times a day (TID) | ORAL | 1 refills | Status: DC | PRN

## 2017-05-21 MED ORDER — LINACLOTIDE 145 MCG PO CAPS: 145.0000 ug | ORAL_CAPSULE | Freq: Every day | ORAL | 0 refills | Status: DC

## 2017-05-21 MED ORDER — HYDROCHLOROTHIAZIDE 25 MG PO TABS: 25.0000 mg | ORAL_TABLET | Freq: Every day | ORAL | 6 refills | Status: DC

## 2017-05-21 NOTE — Progress Notes (Signed)
Subjective:    Patient ID: Erika Hanson, female    DOB: 19-Jun-1966, 51 y.o.   MRN: [id number]  Patient presents for [facility] (is not fasting)   [facility]- Seen by Dr. Hewitt/Dr. Lynnell Grain - Left achilles spur and tear, doing physical, has had for 10 years     Neurology-  Diagnosed with FND in Sept 2017 at Neurology in [facility], has had miultiple MRI of spine, nerve EMG testing told carpal tunnel, was on gabapentin but had increaed weight gain in setting of her obesit, recently changed to topamax and cymbalta.  Her neurolgical symptoms started Oct 2016. Difficulty walking, off balance, tingling in hands/feet. weaknness all over, memory problems, phantom smells. Meds help some of the pain but none of the other symptoms, referred to PT by neurology  Constipation- takes 7 stools softners OTC daily, has not tried other meds, has BM every 3-4 days  HTN- takes bp meds, HCTZ keeps her from swelling  Borderline DM- needs repeat labs, wants to see nutruitionist , she has lost 30lbs past few months on low carb diet  Anxiety- has had her entire life, put on celexa in April 2017 which has helped. 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   Previous PCP - [facility]- she did not have doctor for many years prior to April 2017   Vitamin D def - wants rechecked Mammogram- Due  PAP Smear- due   Review Of Systems:  GEN- + fatigue, fever, weight loss,weakness, recent illness HEENT- denies eye drainage, change in vision, nasal discharge, CVS- denies chest pain, palpitations RESP- denies SOB, cough, wheeze ABD- denies N/V, change in stools, abd pain GU- denies dysuria, hematuria, dribbling, incontinence MSK- + joint pain,+ muscle aches, injury Neuro- denies headache,+ dizziness, syncope, seizure activity       Objective:    BP 134/72   Pulse 70   Temp 98.3 F (36.8 C) (Oral)   Resp 14    Ht 5\' 4"  (1.626 m)   Wt (!) 367 lb (166.5 kg)   SpO2 98%   BMI 63.00 kg/m  GEN- NAD, alert and oriented x3,obese HEENT- PERRL, EOMI, non injected sclera, pink conjunctiva, MMM, oropharynx clear Neck- Supple, no thyromegaly CVS- RRR, no murmur RESP-CTAB ABD-NABS,soft,NT,ND Neuro-CNII-XII intact, speech in tact EXT- No edema Pulses- Radial, DP- 2+        Assessment & Plan:      Problem List Items Addressed This Visit    Vitamin D deficiency   Relevant Orders   Vitamin D, 25-hydroxy (Completed)   Obesity - Primary    Referral to nutritionist, borderline diabetic as well      Relevant Medications   tiZANidine (ZANAFLEX) 4 MG capsule   RESOLVED: Incontinence   Relevant Medications   tiZANidine (ZANAFLEX) 4 MG capsule   Hypertension    Controlled, with HCTZ, and losartan Obtain records from previous PCP      Relevant Medications   losartan (COZAAR) 50 MG tablet   hydrochlorothiazide (HYDRODIURIL) 25 MG tablet   Other Relevant Orders   CBC with Differential/Platelet (Completed)   Comprehensive metabolic panel (Completed)   Lipid panel (Completed)   Gait abnormality    Recommend she proceed with physical therapy if she can afford, very interesting the multiple neurological symptoms she has. No MS per patient, given this non descript NDS syndrome, carpal tunnel by most recent neurologist, but nothing else found.  Has not had spinal tap. Will try to find her MRI of brain. She is very frustrated nothing has been found. She does admit to anxiety but no notations have suggested this is all somatoform She would like to see another neurologist Continue topamax and cymbalta      Relevant Medications   tiZANidine (ZANAFLEX) 4 MG capsule   Borderline diabetes    Check A1C, needs signiicant weight loss which she has started with low carb      Relevant Orders   Hemoglobin A1c (Completed)   Lipid panel (Completed)    Other Visit Diagnoses    Constipation, unspecified  constipation type       Try Linzess 145mg  daily   Neurological dysfunction          Note: This dictation was prepared with Dragon dictation along with smaller phrase technology. Any transcriptional errors that result from this process are unintentional.

## 2017-05-21 NOTE — Patient Instructions (Addendum)
Nutrition REferral Continue current medication  [facility] of records- [place] [facility]  Neurology referral  F/U 4 months

## 2017-05-22 LAB — CBC WITH DIFFERENTIAL/PLATELET
Basophils Absolute: 0 cells/uL (ref 0–200)
Basophils Relative: 0 %
EOS ABS: 265 {cells}/uL (ref 15–500)
Eosinophils Relative: 5 %
HEMATOCRIT: 35.4 % (ref 35.0–45.0)
HEMOGLOBIN: 10.7 g/dL — AB (ref 12.0–15.0)
LYMPHS ABS: 1431 {cells}/uL (ref 850–3900)
Lymphocytes Relative: 27 %
MCH: 22.1 pg — ABNORMAL LOW (ref 27.0–33.0)
MCHC: 30.2 g/dL — AB (ref 32.0–36.0)
MCV: 73.1 fL — AB (ref 80.0–100.0)
MONO ABS: 424 {cells}/uL (ref 200–950)
MPV: 8.5 fL (ref 7.5–12.5)
Monocytes Relative: 8 %
NEUTROS PCT: 60 %
Neutro Abs: 3180 cells/uL (ref 1500–7800)
Platelets: 282 10*3/uL (ref 140–400)
RBC: 4.84 MIL/uL (ref 3.80–5.10)
RDW: 17.8 % — ABNORMAL HIGH (ref 11.0–15.0)
WBC: 5.3 10*3/uL (ref 3.8–10.8)

## 2017-05-22 LAB — LIPID PANEL
CHOLESTEROL: 212 mg/dL — AB (ref <200)
HDL: 48 mg/dL — ABNORMAL LOW (ref >50)
LDL Cholesterol: 135 mg/dL — ABNORMAL HIGH (ref <100)
Total CHOL/HDL Ratio: 4.4 Ratio (ref <5.0)
Triglycerides: 146 mg/dL (ref <150)
VLDL: 29 mg/dL (ref <30)

## 2017-05-22 LAB — COMPREHENSIVE METABOLIC PANEL
ALBUMIN: 3.9 g/dL (ref 3.6–5.1)
ALK PHOS: 92 U/L (ref 33–115)
ALT: 30 U/L — AB (ref 6–29)
AST: 19 U/L (ref 10–35)
BILIRUBIN TOTAL: 0.3 mg/dL (ref 0.2–1.2)
BUN: 22 mg/dL (ref 7–25)
CALCIUM: 9.4 mg/dL (ref 8.6–10.2)
CO2: 24 mmol/L (ref 20–31)
Chloride: 105 mmol/L (ref 98–110)
Creat: 1.2 mg/dL — ABNORMAL HIGH (ref 0.50–1.10)
Glucose, Bld: 87 mg/dL (ref 70–99)
Potassium: 4.3 mmol/L (ref 3.5–5.3)
Sodium: 139 mmol/L (ref 135–146)
Total Protein: 6.6 g/dL (ref 6.1–8.1)

## 2017-05-22 LAB — HEMOGLOBIN A1C
Hgb A1c MFr Bld: 5.8 % — ABNORMAL HIGH (ref <5.7)
MEAN PLASMA GLUCOSE: 120 mg/dL

## 2017-05-22 LAB — VITAMIN D 25 HYDROXY (VIT D DEFICIENCY, FRACTURES): Vit D, 25-Hydroxy: 26 ng/mL — ABNORMAL LOW (ref 30–100)

## 2017-05-22 NOTE — Assessment & Plan Note (Signed)
Controlled, with HCTZ, and losartan Obtain records from previous PCP

## 2017-05-22 NOTE — Assessment & Plan Note (Signed)
Referral to nutritionist, borderline diabetic as well

## 2017-05-22 NOTE — Assessment & Plan Note (Signed)
Check A1C, needs signiicant weight loss which she has started with low carb

## 2017-05-22 NOTE — Assessment & Plan Note (Addendum)
Recommend she proceed with physical therapy if she can afford, very interesting the multiple neurological symptoms she has. No MS per patient, given this non descript NDS syndrome, carpal tunnel by most recent neurologist, but nothing else found. Has not had spinal tap. Will try to find her MRI of brain. She is very frustrated nothing has been found. She does admit to anxiety but no notations have suggested this is all somatoform She would like to see another neurologist Continue topamax and cymbalta

## 2017-05-23 ENCOUNTER — Other Ambulatory Visit: Payer: Self-pay | Admitting: *Deleted

## 2017-05-23 MED ORDER — VITAMIN D (ERGOCALCIFEROL) 1.25 MG (50000 UNIT) PO CAPS: 50000.0000 [IU] | ORAL_CAPSULE | ORAL | 0 refills | Status: DC

## 2017-05-24 LAB — IRON AND TIBC
%SAT: 7 % — ABNORMAL LOW (ref 11–50)
Iron: 28 ug/dL — ABNORMAL LOW (ref 40–190)
TIBC: 418 ug/dL (ref 250–450)
UIBC: 390 ug/dL

## 2017-05-24 LAB — VITAMIN B12: VITAMIN B 12: 365 pg/mL (ref 200–1100)

## 2017-05-24 LAB — FOLATE: FOLATE: 4.9 ng/mL — AB (ref >5.4)

## 2017-05-24 LAB — FERRITIN: Ferritin: 5 ng/mL — ABNORMAL LOW (ref 10–232)

## 2017-05-30 ENCOUNTER — Telehealth: Payer: Self-pay | Admitting: *Deleted

## 2017-05-30 MED ORDER — LINACLOTIDE 145 MCG PO CAPS: 145.0000 ug | ORAL_CAPSULE | Freq: Every day | ORAL | 3 refills | Status: DC

## 2017-05-30 NOTE — Telephone Encounter (Signed)
Received call from patient.   Reports that she has been taking samples of Linzess as prescribed and finds that medication works well. Requested prescription to be sent to pharmacy.   Prescription sent to pharmacy.

## 2017-06-03 ENCOUNTER — Ambulatory Visit: Payer: BLUE CROSS/BLUE SHIELD | Attending: Neurology | Admitting: Physical Therapy

## 2017-06-09 ENCOUNTER — Encounter: Payer: Self-pay | Admitting: Family Medicine

## 2017-06-09 ENCOUNTER — Ambulatory Visit
Admission: RE | Admit: 2017-06-09 | Discharge: 2017-06-09 | Disposition: A | Discharge status: Home / Self Care | Payer: BLUE CROSS/BLUE SHIELD | Source: Ambulatory Visit | Attending: Family Medicine | Admitting: Family Medicine

## 2017-06-09 ENCOUNTER — Telehealth: Payer: Self-pay | Admitting: *Deleted

## 2017-06-09 ENCOUNTER — Ambulatory Visit (INDEPENDENT_AMBULATORY_CARE_PROVIDER_SITE_OTHER): Payer: BLUE CROSS/BLUE SHIELD | Admitting: Family Medicine

## 2017-06-09 VITALS — BP 136/82 | HR 70 | Temp 97.7°F | Resp 16 | Ht 64.0 in | Wt 357.0 lb

## 2017-06-09 DIAGNOSIS — M7989 Other specified soft tissue disorders: Secondary | ICD-10-CM

## 2017-06-09 DIAGNOSIS — K5909 Other constipation: Secondary | ICD-10-CM | POA: Diagnosis not present

## 2017-06-09 MED ORDER — POLYETHYLENE GLYCOL 3350 17 GM/SCOOP PO POWD: 17.0000 g | Freq: Two times a day (BID) | ORAL | 2 refills | Status: DC | PRN

## 2017-06-09 NOTE — Telephone Encounter (Signed)
Received VM from patient.   Reports that she forgot to inquire as to if she should continue treatment today on heel. States that appointment i scheduled for 12pm today.   MD please advise.

## 2017-06-09 NOTE — Telephone Encounter (Signed)
Call placed to patient. No answer, VM full.  °

## 2017-06-09 NOTE — Assessment & Plan Note (Signed)
Add Miralax to her Linzess, she is history of chronic severe constipation. Continue to increase water and fiber

## 2017-06-09 NOTE — Telephone Encounter (Signed)
Patient returned call and made aware.

## 2017-06-09 NOTE — Patient Instructions (Addendum)
Take miralax once a day and Linzess  Ultrasound of leg to be done  Release of records [employer] F/U pending results

## 2017-06-09 NOTE — Progress Notes (Signed)
   Subjective:    Patient ID: Erika Hanson, female    DOB: 1966/01/21, 50 y.o.   MRN: [id number]  Patient presents for LLE Edema (x5 days- ankle and foot edema- has had tx for heel and noted edema after- jdoes not decrease wit elevation) and Medication Management (Linzess is not longer as effective as it used to be- only going q2-3 days)  Pt here with edema LLE forpast 5 days.  Currently seeing [facility] on her left heel/leg and using Tens unit, had treatment 5 times, they are treating the heel spur and achilles tendinitis/bursititis She was told to come see me as she has swelling in her left lower leg on the medial aspect that did not go down despite the treatment. She has some mild tingling but no significant pain. She has not had any recent travel. She states that it feels squishy"   Note had Neurology appt on 8/2 for second opinion, She continues to have spells where she describes he gets shaky her mouth gets dry she gets weak they last about 5-10 minutes the last occur when she was at a restaurant last week. She did not call EMS states that she is called in the past and did not find anything she's once the hospital once before they did not find anything.  Constipation- at last visit given Linzess 145mg  once a day,states medication was working very well in the beginning now has BM every 2-3 days.   Review Of Systems:  GEN- denies fatigue, fever, weight loss,weakness, recent illness ABD- denies N/V,+ change in stools, abd pain GU- denies dysuria, hematuria, dribbling, incontinence MSK- denies joint pain, +muscle aches, injury Neuro- denies headache, dizziness, syncope, seizure activity       Objective:    BP 136/82   Pulse 70   Temp 97.7 F (36.5 C) (Oral)   Resp 16   Ht 5\' 4"  (1.626 m)   Wt (!) 357 lb (161.9 kg)   SpO2 99%   BMI 61.28 kg/m  GEN- NAD, alert and oriented x3,obese  MSK- Good ROM bilat knees, decreased rom left ankle compared to right, able to  weight bear bilat , non antalgic gait  EXT- No pedal edema, swelling of left lower leg, few inches above left medial malleous, NT, no palpable hematoma, neg homans bilat, leg 16cm left vs 14cm right , heel spur bilat and swelling at achiiles insertion  left side  Pulses- Radial, DP- 2+        Assessment & Plan:      Problem List Items Addressed This Visit    Chronic constipation    Add Miralax to her Linzess, she is history of chronic severe constipation. Continue to increase water and fiber      Relevant Medications   polyethylene glycol powder (GLYCOLAX/MIRALAX) powder    Other Visit Diagnoses    Left leg swelling    -  Primary   She has a lot of adipose tissue but does have localized swelling which is in subcutaneous tissue, no fluid to suggest edema, will get [place], dx lipoma, adipose tissue less likely hematoma with no trauma   Relevant Orders   [place] LT LOWER EXTREM LTD SOFT TISSUE NON VASCULAR      Note: This dictation was prepared with Dragon dictation along with smaller phrase technology. Any transcriptional errors that result from this process are unintentional.

## 2017-06-09 NOTE — Telephone Encounter (Signed)
Reschedule until we get [place] results

## 2017-06-10 ENCOUNTER — Encounter: Payer: BLUE CROSS/BLUE SHIELD | Attending: Family Medicine | Admitting: Registered"

## 2017-06-10 ENCOUNTER — Encounter: Payer: Self-pay | Admitting: Registered"

## 2017-06-10 DIAGNOSIS — Z6841 Body Mass Index (BMI) 40.0 and over, adult: Secondary | ICD-10-CM | POA: Insufficient documentation

## 2017-06-10 DIAGNOSIS — R7303 Prediabetes: Secondary | ICD-10-CM

## 2017-06-10 DIAGNOSIS — Z713 Dietary counseling and surveillance: Secondary | ICD-10-CM | POA: Diagnosis not present

## 2017-06-10 DIAGNOSIS — E669 Obesity, unspecified: Secondary | ICD-10-CM | POA: Insufficient documentation

## 2017-06-10 NOTE — Progress Notes (Signed)
Medical Nutrition Therapy:  Appt start time: 0930 end time:  2924.   Assessment:  Primary concerns today: Pt states when she found out that her A1c was 6.1 she made changes to her diet and reduced portion size and carbs. Pt reports that she has lost 44 lbs since April 30 and A1c is now 5.6.  Pt states constipation is an issue which started about same time she started taking Tomopax (on April 30). Patient states she drinks a lot of water.   Pt was told her Iron & Folic Acid were low and she should supplement. RD recommended a brand of Iron which seems to not cause constipation as much as some other brands.  Preferred Learning Style:   No preference indicated   Learning Readiness:   Change in progress  MEDICATIONS: reviewed   DIETARY INTAKE:  Avoided foods include pasta.    24-hr recall:  B ( AM): omlette OR PB& J on wheat bread OR egg bacon OR egg, pork, (no bread) Snk ( AM): none L ( PM): none OR yogurt OR strawberry slushy Snk ( PM): none D ( PM): salad sometimes w protein OR beef stick with tomato OR burger w 1/2 bun OR chili and salad Snk ( PM): none OR 1/2 slushie OR grapefruit Beverages: sweet tea 1 sm glass w meal  Usual physical activity: ADLs  Estimated energy needs: 1800 calories 200 g carbohydrates 135 g protein 50 g fat  Progress Towards Goal(s):  In progress.   Nutritional Diagnosis:  NI-5.8.4 Inconsistent carbohydrate intake As related to pt avoiding carbohydrates at meals but having carb only snacks.  As evidenced by dietary recall.    Intervention:  Nutrition education for managing blood glucose with diet and lifestyle changes. Described diabetes. Stated some common risk factors for diabetes.  Defined the role of glucose and insulin.  Identified type of diabetes and pathophysiology. Described the role of different macronutrients on glucose.  Explained how carbohydrates affect blood glucose.  Stated what foods contain the most carbohydrates.  Demonstrated  carbohydrate counting.  Demonstrated how to read Nutrition Facts food label.  Plan: Check with pharmacist about grapefruit potential interaction. Include more beans in your diet Check nutrition facts label on your trail mix Consider having a handful of almonds often Aim to have 3 balanced meals per day When eating fruit make sure to include protein. Can use unsweetened soy milk with strawberries for balance.  Teaching Method Utilized:  Visual Auditory  Handouts given during visit include:  My Plate  Living with Diabetes (copies)  Carb sheet  Barriers to learning/adherence to lifestyle change: none  Demonstrated degree of understanding via:  Teach Back   Monitoring/Evaluation:  Dietary intake, exercise, and body weight prn.

## 2017-06-10 NOTE — Patient Instructions (Addendum)
Check with pharmacist about grapefruit potential interaction. Include more beans in your diet Check nutrition facts label on your trail mix Consider having a handful of almonds often Aim to have 3 balanced meals per day When eating fruit make sure to include protein. Can use unsweetened soy milk with strawberries for balance.

## 2017-06-23 ENCOUNTER — Telehealth: Payer: Self-pay | Admitting: *Deleted

## 2017-06-23 MED ORDER — FUSION 65-65-25-30 MG PO CAPS: 1.0000 | ORAL_CAPSULE | Freq: Two times a day (BID) | ORAL | 3 refills | Status: DC

## 2017-06-23 NOTE — Telephone Encounter (Signed)
Okay to change iron tablet, keep taking other bowel medication  Iron fusion once a day

## 2017-06-23 NOTE — Telephone Encounter (Signed)
Call placed to patient and patient made aware.   Prescription sent to pharmacy.  

## 2017-06-23 NOTE — Telephone Encounter (Signed)
Received call from patient.   Reports that she continues to have increased constipation.   Reports that she is taking Miralax BID with Linzess, but she is still only going to BR Q 4-5 days. States that she thinks her iron tablet is causing her to be constipated.   Requested order to change iron to iron fusion plus with probiotic. MD please advise.

## 2017-06-24 ENCOUNTER — Telehealth: Payer: Self-pay | Admitting: *Deleted

## 2017-06-24 NOTE — Telephone Encounter (Signed)
Received fax from pharmacy reporting that Fusion plus is not covered.   Per MD, use OTC Fe with stool softener.

## 2017-06-25 NOTE — Telephone Encounter (Signed)
LVM instructing: pharmacy reporting that Fusion plus is not covered.   Per MD, use OTC Fe with stool softener

## 2017-06-26 DIAGNOSIS — R29898 Other symptoms and signs involving the musculoskeletal system: Secondary | ICD-10-CM

## 2017-06-26 DIAGNOSIS — M5127 Other intervertebral disc displacement, lumbosacral region: Secondary | ICD-10-CM

## 2017-06-26 DIAGNOSIS — M2550 Pain in unspecified joint: Secondary | ICD-10-CM | POA: Insufficient documentation

## 2017-06-26 DIAGNOSIS — R404 Transient alteration of awareness: Secondary | ICD-10-CM | POA: Insufficient documentation

## 2017-06-26 DIAGNOSIS — G5601 Carpal tunnel syndrome, right upper limb: Secondary | ICD-10-CM | POA: Insufficient documentation

## 2017-06-26 HISTORY — DX: Other symptoms and signs involving the musculoskeletal system: R29.898

## 2017-06-26 HISTORY — DX: Other intervertebral disc displacement, lumbosacral region: M51.27

## 2017-07-01 ENCOUNTER — Telehealth: Payer: Self-pay | Admitting: *Deleted

## 2017-07-01 NOTE — Telephone Encounter (Signed)
Received call from patient.   States that she has increased sinu pressure and drainage. Reports that she has taken #8 tabs of Amoxiillin 500mg  that she had left over from another prescription, and swelling has improved, but not resolved. Advised to use OTC Flonase and nasal saline, and if Sx are not improved by end of week, schedule OV.

## 2017-07-01 NOTE — Telephone Encounter (Signed)
Call placed to patient and patient made aware.  

## 2017-07-11 ENCOUNTER — Ambulatory Visit: Payer: BLUE CROSS/BLUE SHIELD | Admitting: Family Medicine

## 2017-07-25 ENCOUNTER — Ambulatory Visit (INDEPENDENT_AMBULATORY_CARE_PROVIDER_SITE_OTHER): Payer: BLUE CROSS/BLUE SHIELD | Admitting: Family Medicine

## 2017-07-25 ENCOUNTER — Encounter: Payer: Self-pay | Admitting: Family Medicine

## 2017-07-25 VITALS — BP 136/70 | HR 78 | Temp 98.7°F | Resp 14 | Ht 64.0 in | Wt 339.0 lb

## 2017-07-25 DIAGNOSIS — F459 Somatoform disorder, unspecified: Secondary | ICD-10-CM | POA: Diagnosis not present

## 2017-07-25 DIAGNOSIS — F329 Major depressive disorder, single episode, unspecified: Secondary | ICD-10-CM

## 2017-07-25 DIAGNOSIS — F411 Generalized anxiety disorder: Secondary | ICD-10-CM | POA: Diagnosis not present

## 2017-07-25 DIAGNOSIS — F331 Major depressive disorder, recurrent, moderate: Secondary | ICD-10-CM | POA: Diagnosis not present

## 2017-07-25 HISTORY — DX: Generalized anxiety disorder: F41.1

## 2017-07-25 HISTORY — DX: Major depressive disorder, single episode, unspecified: F32.9

## 2017-07-25 MED ORDER — CLONAZEPAM 0.5 MG PO TABS: 0.5000 mg | ORAL_TABLET | Freq: Two times a day (BID) | ORAL | 1 refills | Status: DC | PRN

## 2017-07-25 NOTE — Assessment & Plan Note (Addendum)
Neurological symptoms appear to be psychosomatic with negative workup. She does have a pending EEG but has had the past and was also normal. She does admit that her symptoms are worsened with stress. She is willing to go see a psychiatrist. I will give her clonazepam 0.5 mg #45 tablets to be used as needed and she is on on Celexa and Cymbalta. No suicidal ideations

## 2017-07-25 NOTE — Patient Instructions (Addendum)
F/U as previous  Take klonopin at bedtime  Referral to psychiatry

## 2017-07-25 NOTE — Progress Notes (Signed)
   Subjective:    Patient ID: Erika Hanson, female    DOB: 1966-01-09, 51 y.o.   MRN: [id number]  Patient presents for Anxiety (increased stress in life- states that she was seen by neuro and was advised that Sx (tremors?) are psychological and are caused by anxiety)  Patient here to discuss her generalized anxiety I referred her to neurology again for her multiple neurological symptoms. Per previous neurologist they have diagnosed this as more somatoform and psychosocial. The recommend that she had further treatment for her generalized anxiety She is currently on Celexa 40 mg daily as well as Cymbalta 60 mg daily.Cymbalta was given for her neuropathy. She still feels like there is something more going on echo she admits that her symptoms worsen after her and her son started having some difficulties a few weeks ago states he became angry and they have not been speaking since she has a new boyfriend. She is not sleeping well she's always tearful.  She does have an EEG pending but all of her other workup thus far has been negative for her neurological symptoms  Review Of Systems:  GEN- + fatigue, fever, weight loss,weakness, recent illness HEENT- denies eye drainage, change in vision, nasal discharge, CVS- denies chest pain, palpitations RESP- denies SOB, cough, wheeze ABD- denies N/V, change in stools, abd pain GU- denies dysuria, hematuria, dribbling, incontinence MSK- denies joint pain, muscle aches, injury Neuro- denies headache,+ dizziness, syncope, seizure activity       Objective:    BP 136/70   Pulse 78   Temp 98.7 F (37.1 C) (Oral)   Resp 14   Ht 5\' 4"  (1.626 m)   Wt (!) 339 lb (153.8 kg)   SpO2 100%   BMI 58.19 kg/m  GEN- NAD, alert and oriented x3 Psych- depressed appearing, crying, normal speech no suicidal ideations, pleasant        Assessment & Plan:      Problem List Items Addressed This Visit      Unprioritized   MDD (major depressive disorder)   GAD (generalized anxiety disorder)    Neurological symptoms appear to be psychosomatic with negative workup. She does have a pending EEG but has had the past and was also normal. She does admit that her symptoms are worsened with stress. She is willing to go see a psychiatrist. I will give her clonazepam 0.5 mg #45 tablets to be used as needed and she is on on Celexa and Cymbalta. No suicidal ideations       Other Visit Diagnoses    Psychosomatic disorder    -  Primary      Note: This dictation was prepared with Dragon dictation along with smaller phrase technology. Any transcriptional errors that result from this process are unintentional.

## 2017-08-01 ENCOUNTER — Telehealth: Payer: Self-pay

## 2017-08-01 NOTE — Telephone Encounter (Signed)
Patient would like to try an get an appointment sooner than Oct or Nov for Psychiatry

## 2017-08-04 NOTE — Telephone Encounter (Signed)
Gave pt some other numbers of local offices to call and try to get earlier appt.  [facility] could not see her until end October early November

## 2017-08-26 ENCOUNTER — Telehealth: Payer: Self-pay | Admitting: *Deleted

## 2017-08-26 NOTE — Telephone Encounter (Signed)
Needs OV nothing else to add until seen

## 2017-08-26 NOTE — Telephone Encounter (Signed)
Received call from patient.   Reports that she is having increased back and L sided leg pain x2 weeks. Reports that pain begins in lower back and radiates down through L hip and leg to L ankle. States that she thinks she is having nerve pain.  Reports that she has been taking Cymbalta, Naproxen, and Zanaflex with no relief.   Reports that she is having difficulty sitting, standing or lying down without pain.   Appointment scheduled for 08/27/2017, but requested MD to advise if there is anything she can do for relief today.

## 2017-08-26 NOTE — Telephone Encounter (Signed)
Call placed to patient and patient made aware.   Will keep appointment for 08/27/2017.

## 2017-08-27 ENCOUNTER — Encounter: Payer: Self-pay | Admitting: Family Medicine

## 2017-08-27 ENCOUNTER — Ambulatory Visit (INDEPENDENT_AMBULATORY_CARE_PROVIDER_SITE_OTHER): Payer: BLUE CROSS/BLUE SHIELD | Admitting: Family Medicine

## 2017-08-27 VITALS — BP 132/76 | HR 70 | Temp 98.0°F | Resp 18 | Ht 64.0 in | Wt 332.0 lb

## 2017-08-27 DIAGNOSIS — M5126 Other intervertebral disc displacement, lumbar region: Secondary | ICD-10-CM | POA: Diagnosis not present

## 2017-08-27 DIAGNOSIS — M5136 Other intervertebral disc degeneration, lumbar region: Secondary | ICD-10-CM

## 2017-08-27 DIAGNOSIS — M48061 Spinal stenosis, lumbar region without neurogenic claudication: Secondary | ICD-10-CM | POA: Diagnosis not present

## 2017-08-27 DIAGNOSIS — M5416 Radiculopathy, lumbar region: Secondary | ICD-10-CM

## 2017-08-27 MED ORDER — HYDROCODONE-ACETAMINOPHEN 5-325 MG PO TABS: 1.0000 | ORAL_TABLET | Freq: Four times a day (QID) | ORAL | 0 refills | Status: DC | PRN

## 2017-08-27 MED ORDER — METHYLPREDNISOLONE 4 MG PO TBPK: ORAL_TABLET | ORAL | 0 refills | Status: DC

## 2017-08-27 NOTE — Patient Instructions (Signed)
Referral to Dr. Maia Petties Take steroids , pain medication F/U as previous

## 2017-08-27 NOTE — Progress Notes (Signed)
Subjective:    Patient ID: Erika Hanson, female    DOB: [birth year], 51 y.o.   MRN: [id number]  Patient presents for Left > right back pain radiating down leg and Frequent falls Patient here with back pain radiating down both legs is a chronic issue associated with her other frequent falls and other neurological complaints. He was seen by neurology had MRI done along her entire cervical spine has had brain MRIs imaging she was also seen at [facility] for her episodes.  Back pain worse over the past 3-4 weeks,   Regards to her back pain he has mild stenosis as well as degenerative disc disease noted. She has severe foraminal narrowing on the left side which could contribute to some L5 nerve root compression based on her MRI   She fell yesterday at home , has fallen twice, yesterday hit her left leg, has a big bruise.  Saturday she fell between bed and night stand No change in bowel or bladder  IMPRESSION:  This MRI of the lumbar spine without contrast shows the following: 1.    At L3-L4 there is mild spinal stenosis due to severe facet hypertrophy. There does not appear to be nerve root compression at this level. 2.    At L4-L5, there is moderately severe facet hypertrophy and disc bulging but no nerve root compression. 3.    At L5-S1, there is broad disc herniation and other degenerative changes causing severe left foraminal narrowing that could lead to left L5 nerve root compression. There is also moderate right foraminal and bilateral lateral recess stenosis. 4.    There are large left renal cysts   Started using zanaflex regulary, taking naprosyn BID/ tried ibuprofen    Review Of Systems:  GEN- denies fatigue, fever, weight loss,weakness, recent illness HEENT- denies eye drainage, change in vision, nasal discharge, CVS- denies chest pain, palpitations RESP- denies SOB, cough, wheeze ABD- denies N/V, change in stools, abd pain GU- denies dysuria, hematuria, dribbling,  incontinence MSK- +joint pain, muscle aches, injury Neuro- denies headache, dizziness, syncope, seizure activity       Objective:    BP 132/76   Pulse 70   Temp 98 F (36.7 C) (Oral)   Resp 18   Ht 5\' 4"  (1.626 m)   Wt (!) 332 lb (150.6 kg)   BMI 56.99 kg/m  GEN- NAD, alert and oriented x3 HEENT- PERRL, EOMI, non injected sclera, pink conjunctiva, MMM, oropharynx clear Neck- Supple, no thyromegaly CVS- RRR, no murmur RESP-CTAB Neuro-CNII-XII in tact, antalgic gait, normal tone LE,decreased strength left LE compared to right  MSK- TTP lumbar spine and left paraspinals, decreased ROM but difficult due to habitus  Large bruise on below left knee, TTP , small hematoma,decreased ROM knees  EXT- No edema Pulses- Radial  2+        Assessment & Plan:      Problem List Items Addressed This Visit    None    Visit Diagnoses    DDD (degenerative disc disease), lumbar    -  Primary   Spinal stenosis, DDD, large habitus contributing to pain, for nerve impingement MEdrol dose pak, norco, zanaflex, set up with neurosurgery. No red flags  She has been evaluated for her balance issues many times no cause can be found, determined most of symptoms pychosomatic.  She does have true lumbar pathology    Relevant Medications   HYDROcodone-acetaminophen (NORCO) 5-325 MG tablet   methylPREDNISolone (MEDROL DOSEPAK) 4 MG TBPK tablet  Spinal stenosis at L4-L5 level       Bulge of lumbar disc without myelopathy       Lumbar nerve root impingement          Note: This dictation was prepared with Dragon dictation along with smaller phrase technology. Any transcriptional errors that result from this process are unintentional.

## 2017-08-28 ENCOUNTER — Encounter: Payer: Self-pay | Admitting: Family Medicine

## 2017-08-28 ENCOUNTER — Other Ambulatory Visit: Payer: Self-pay | Admitting: Family Medicine

## 2017-08-28 MED ORDER — METHYLPREDNISOLONE 4 MG PO TBPK: ORAL_TABLET | ORAL | 0 refills | Status: DC

## 2017-09-02 ENCOUNTER — Other Ambulatory Visit: Payer: Self-pay | Admitting: Family Medicine

## 2017-09-22 ENCOUNTER — Encounter: Payer: Self-pay | Admitting: Family Medicine

## 2017-09-22 ENCOUNTER — Ambulatory Visit (INDEPENDENT_AMBULATORY_CARE_PROVIDER_SITE_OTHER): Payer: BLUE CROSS/BLUE SHIELD | Admitting: Family Medicine

## 2017-09-22 VITALS — BP 136/76 | HR 80 | Temp 98.0°F | Resp 16 | Ht 64.0 in | Wt 328.0 lb

## 2017-09-22 DIAGNOSIS — Z6841 Body Mass Index (BMI) 40.0 and over, adult: Secondary | ICD-10-CM | POA: Diagnosis not present

## 2017-09-22 DIAGNOSIS — F411 Generalized anxiety disorder: Secondary | ICD-10-CM

## 2017-09-22 DIAGNOSIS — F331 Major depressive disorder, recurrent, moderate: Secondary | ICD-10-CM | POA: Diagnosis not present

## 2017-09-22 DIAGNOSIS — D509 Iron deficiency anemia, unspecified: Secondary | ICD-10-CM | POA: Insufficient documentation

## 2017-09-22 DIAGNOSIS — D508 Other iron deficiency anemias: Secondary | ICD-10-CM | POA: Diagnosis not present

## 2017-09-22 DIAGNOSIS — I10 Essential (primary) hypertension: Secondary | ICD-10-CM

## 2017-09-22 HISTORY — DX: Iron deficiency anemia, unspecified: D50.9

## 2017-09-22 NOTE — Patient Instructions (Signed)
F/U 4 months  

## 2017-09-22 NOTE — Progress Notes (Signed)
   Subjective:    Patient ID: Erika Hanson, female    DOB: 22-Mar-1966, 51 y.o.   MRN: [id number]  Patient presents for Follow-up (is not fasting)  DDD/ buling disc- will have injections will be done by Dr. Basil Dess, now on Lyrica 50mg  once a day   not helping as much   Psychiatry- has not had the appointment yet, states a few people talk to her but appointment was never set up   Klonopin takes 1/2 tablet then it helps with her shaking is not felt is anxious she is also taking her Cymbalta  Someone stole her Topamax she is on 200 mg twice a day she cannot get this refilled for about 2 weeks and needs an alternative this helps control her headaches and overall makes her feel better also helps with her weight loss    Anemia- gets ridges in nails is not been taking her iron supplements     Review Of Systems:  GEN- denies fatigue, fever, weight loss,weakness, recent illness HEENT- denies eye drainage, change in vision, nasal discharge, CVS- denies chest pain, palpitations RESP- denies SOB, cough, wheeze ABD- denies N/V, change in stools, abd pain GU- denies dysuria, hematuria, dribbling, incontinence MSK- +joint pain, muscle aches, injury Neuro- denies headache, dizziness, syncope, seizure activity       Objective:    BP 136/76   Pulse 80   Temp 98 F (36.7 C) (Oral)   Resp 16   Ht 5\' 4"  (1.626 m)   Wt (!) 328 lb (148.8 kg)   SpO2 99%   BMI 56.30 kg/m  GEN- NAD, alert and oriented x3,obese  HEENT- PERRL, EOMI, non injected sclera, pink conjunctiva, MMM, oropharynx clear CVS- RRR, no murmur RESP-CTAB Psych- normal affect and mood, no SI  Skin- vertical and horizontal ridges in nails bilat  EXT- No edema Pulses- Radial  2+        Assessment & Plan:      Problem List Items Addressed This Visit      Unprioritized   Obesity   GAD (generalized anxiety disorder)   MDD (major depressive disorder) - Primary    She was directed to my referral nurse and given direct  information about the psychiatrist again Continue cymbalta and klonopin which helps her anxiety       Hypertension    Controlled no changes      Anemia, iron deficiency    Recheck iron stores, advised to take iron supplement daily Also get from diet      Relevant Medications   ferrous sulfate 325 (65 FE) MG tablet   Other Relevant Orders   CBC with Differential/Platelet (Completed)   Iron, TIBC and Ferritin Panel (Completed)      Note: This dictation was prepared with Dragon dictation along with smaller phrase technology. Any transcriptional errors that result from this process are unintentional.

## 2017-09-23 ENCOUNTER — Encounter: Payer: Self-pay | Admitting: Family Medicine

## 2017-09-23 LAB — CBC WITH DIFFERENTIAL/PLATELET
BASOS PCT: 0.8 %
Basophils Absolute: 47 cells/uL (ref 0–200)
EOS ABS: 307 {cells}/uL (ref 15–500)
Eosinophils Relative: 5.2 %
HEMATOCRIT: 35.1 % (ref 35.0–45.0)
Hemoglobin: 11.3 g/dL — ABNORMAL LOW (ref 11.7–15.5)
LYMPHS ABS: 1670 {cells}/uL (ref 850–3900)
MCH: 23.2 pg — ABNORMAL LOW (ref 27.0–33.0)
MCHC: 32.2 g/dL (ref 32.0–36.0)
MCV: 72.1 fL — AB (ref 80.0–100.0)
MPV: 9.5 fL (ref 7.5–12.5)
Monocytes Relative: 10.2 %
NEUTROS PCT: 55.5 %
Neutro Abs: 3275 cells/uL (ref 1500–7800)
Platelets: 291 10*3/uL (ref 140–400)
RBC: 4.87 10*6/uL (ref 3.80–5.10)
RDW: 16.1 % — ABNORMAL HIGH (ref 11.0–15.0)
Total Lymphocyte: 28.3 %
WBC: 5.9 10*3/uL (ref 3.8–10.8)
WBCMIX: 602 {cells}/uL (ref 200–950)

## 2017-09-23 LAB — IRON,TIBC AND FERRITIN PANEL
%SAT: 8 % (calc) — ABNORMAL LOW (ref 11–50)
Ferritin: 5 ng/mL — ABNORMAL LOW (ref 10–232)
IRON: 29 ug/dL — AB (ref 40–190)
TIBC: 374 mcg/dL (calc) (ref 250–450)

## 2017-09-23 NOTE — Assessment & Plan Note (Signed)
Controlled no changes 

## 2017-09-23 NOTE — Assessment & Plan Note (Signed)
She was directed to my referral nurse and given direct information about the psychiatrist again Continue cymbalta and klonopin which helps her anxiety

## 2017-09-23 NOTE — Assessment & Plan Note (Signed)
Recheck iron stores, advised to take iron supplement daily Also get from diet

## 2017-10-01 ENCOUNTER — Other Ambulatory Visit: Payer: Self-pay | Admitting: Family Medicine

## 2017-10-01 NOTE — Telephone Encounter (Signed)
Ok to refill??  Last office visit 09/22/2017.  Last refill 07/25/2017, #1 refill.

## 2017-10-01 NOTE — Telephone Encounter (Signed)
Okay to refill? 

## 2017-10-02 ENCOUNTER — Other Ambulatory Visit: Payer: Self-pay | Admitting: *Deleted

## 2017-10-02 MED ORDER — FERROUS SULFATE 325 (65 FE) MG PO TABS: 325.0000 mg | ORAL_TABLET | Freq: Two times a day (BID) | ORAL | Status: DC

## 2017-10-02 MED ORDER — TOPIRAMATE 100 MG PO TABS: 200.0000 mg | ORAL_TABLET | Freq: Two times a day (BID) | ORAL | 11 refills | Status: DC

## 2017-10-02 NOTE — Telephone Encounter (Signed)
Medication called to pharmacy. 

## 2017-10-07 ENCOUNTER — Ambulatory Visit: Payer: Self-pay | Admitting: Psychiatry

## 2017-10-28 ENCOUNTER — Ambulatory Visit: Payer: Self-pay | Admitting: Psychiatry

## 2017-11-05 ENCOUNTER — Other Ambulatory Visit: Payer: Self-pay | Admitting: Family Medicine

## 2017-11-05 NOTE — Telephone Encounter (Signed)
Ok to refill??      Tizanidine

## 2017-11-06 NOTE — Telephone Encounter (Signed)
Okay to refill the tizanidine and celexa

## 2017-12-02 ENCOUNTER — Encounter: Payer: Self-pay | Admitting: *Deleted

## 2017-12-02 ENCOUNTER — Other Ambulatory Visit: Payer: Self-pay

## 2017-12-02 ENCOUNTER — Encounter: Payer: Self-pay | Admitting: Family Medicine

## 2017-12-02 ENCOUNTER — Ambulatory Visit (INDEPENDENT_AMBULATORY_CARE_PROVIDER_SITE_OTHER): Payer: BLUE CROSS/BLUE SHIELD | Admitting: Family Medicine

## 2017-12-02 VITALS — BP 138/72 | HR 88 | Temp 97.8°F | Resp 14 | Ht 64.0 in | Wt 326.0 lb

## 2017-12-02 DIAGNOSIS — K5909 Other constipation: Secondary | ICD-10-CM | POA: Diagnosis not present

## 2017-12-02 DIAGNOSIS — D229 Melanocytic nevi, unspecified: Secondary | ICD-10-CM | POA: Diagnosis not present

## 2017-12-02 DIAGNOSIS — D171 Benign lipomatous neoplasm of skin and subcutaneous tissue of trunk: Secondary | ICD-10-CM | POA: Diagnosis not present

## 2017-12-02 DIAGNOSIS — M5136 Other intervertebral disc degeneration, lumbar region: Secondary | ICD-10-CM

## 2017-12-02 DIAGNOSIS — G894 Chronic pain syndrome: Secondary | ICD-10-CM | POA: Diagnosis not present

## 2017-12-02 DIAGNOSIS — Z6841 Body Mass Index (BMI) 40.0 and over, adult: Secondary | ICD-10-CM | POA: Diagnosis not present

## 2017-12-02 MED ORDER — ALBUTEROL SULFATE HFA 108 (90 BASE) MCG/ACT IN AERS: 1.0000 | INHALATION_SPRAY | RESPIRATORY_TRACT | 1 refills | Status: DC | PRN

## 2017-12-02 MED ORDER — TOPIRAMATE 200 MG PO TABS: 200.0000 mg | ORAL_TABLET | Freq: Two times a day (BID) | ORAL | 2 refills | Status: DC

## 2017-12-02 MED ORDER — DULOXETINE HCL 30 MG PO CPEP
30.0000 mg | ORAL_CAPSULE | Freq: Every day | ORAL | 6 refills | Status: DC
Start: 2017-12-02 — End: 2018-04-06

## 2017-12-02 NOTE — Progress Notes (Signed)
Subjective:    Patient ID: Erika Hanson, female    DOB: 1966-07-10, 52 y.o.   MRN: [id number]  Patient presents for Medication Management  Pt here to discuss her medications.  Continue to have severe pain her back.  She has been seen by [facility] she has degenerative disc disease mild disc bulge with some nerve root impingement minimal spinal stenosis.  Her weight is also contributing factor.  She had a couple of injections done this did not help very much.  The next that was supposed to have epidurals done in different spots to see which improved her pain the most however she cannot afford to have this done at this time.  They also recommended physical therapy for her neck she has not started this.  She was given Lyrica initially at bedtime and then told to take up to 3 times a day but this makes her very somnolent and sleepy states that she feels "stupid".  She wants to know if there is anything else we can change on her medications.  He also complained of multiple moles on her skin and was concerned that about one on her the middle of her back.  Constipation she is taking Linzess 145 mcg daily but some days she doubles up would like to try the higher dose.  A spot on her right lower flank area she thinks may be a fatty tumor she just felt it recently.  Review Of Systems:  GEN- denies fatigue, fever, weight loss,weakness, recent illness HEENT- denies eye drainage, change in vision, nasal discharge, CVS- denies chest pain, palpitations RESP- denies SOB, cough, wheeze ABD- denies N/V, change in stools, abd pain GU- denies dysuria, hematuria, dribbling, incontinence MSK- + joint pain, muscle aches, injury Neuro- denies headache, dizziness, syncope, seizure activity       Objective:    BP 138/72   Pulse 88   Temp 97.8 F (36.6 C) (Oral)   Resp 14   Ht 5\' 4"  (1.626 m)   Wt (!) 326 lb (147.9 kg)   SpO2 98%   BMI 55.96 kg/m  GEN- NAD, alert and oriented  x3 CVS- RRR, no murmur RESP-CTAB ABD-NABS,soft,NT,ND Right lower flank- fatty tumor palpated  Skin- multilple freckles, brown flat nevi, cherry angiomas, moles on back- with mild hyperpigmentation EXT- No edema Pulses- Radial, DP- 2+        Assessment & Plan:      Problem List Items Addressed This Visit      Unprioritized   Obesity   Chronic constipation   DDD (degenerative disc disease), lumbar - Primary   Chronic pain syndrome    See previous notes significant chronic pain syndrome with fibromyalgia some amount of form episodes as well.  She does have true pathology in her back.  Working on weight loss which is been very slow.  She also asked for a note for disability stating that she cannot work because of her medications I declined this as I do not feel at this is a true statement for her medications.  I am going to discontinue the Lyrica as she has had significant side effects with this.  I will increase her Cymbalta to 90 mg total daily.  Her constipation will increase her Linzess to 290 mcg have given her samples of this that she can try first.  She has a follow-up with her orthopedist in about 2 weeks she can discuss other options since she cannot afford the injections at this time.  She is also to schedule her physical therapy.  Referral to dermatology for the multiple nevi  Fatty tumor, benign, will monitor       Other Visit Diagnoses    Lipoma of torso       Multiple nevi       Relevant Orders   Ambulatory referral to Dermatology      Note: This dictation was prepared with Dragon dictation along with smaller phrase technology. Any transcriptional errors that result from this process are unintentional.

## 2017-12-02 NOTE — Assessment & Plan Note (Signed)
See previous notes significant chronic pain syndrome with fibromyalgia some amount of form episodes as well.  She does have true pathology in her back.  Working on weight loss which is been very slow.  She also asked for a note for disability stating that she cannot work because of her medications I declined this as I do not feel at this is a true statement for her medications.  I am going to discontinue the Lyrica as she has had significant side effects with this.  I will increase her Cymbalta to 90 mg total daily.  Her constipation will increase her Linzess to 290 mcg have given her samples of this that she can try first.  She has a follow-up with her orthopedist in about 2 weeks she can discuss other options since she cannot afford the injections at this time.  She is also to schedule her physical therapy.  Referral to dermatology for the multiple nevi  Fatty tumor, benign, will monitor

## 2017-12-02 NOTE — Patient Instructions (Addendum)
Increase Cymbalta to 90mg  once a day  Stop the lyrica  Linzess try the  290mg  for constipation  Referral to dermatology  F/U As previous

## 2017-12-05 ENCOUNTER — Other Ambulatory Visit: Payer: Self-pay | Admitting: Family Medicine

## 2017-12-05 NOTE — Telephone Encounter (Signed)
Ok to refill??  Last office visit 12/02/2017.  Last refill on Klonopin 10/02/2017, #1 refills.   Last refill on Zanaflex 05/21/2017, #1 refills.

## 2017-12-10 ENCOUNTER — Ambulatory Visit: Payer: BLUE CROSS/BLUE SHIELD | Admitting: Psychiatry

## 2017-12-16 ENCOUNTER — Encounter: Payer: Self-pay | Admitting: Psychiatry

## 2017-12-16 ENCOUNTER — Other Ambulatory Visit: Payer: Self-pay

## 2017-12-16 ENCOUNTER — Ambulatory Visit: Payer: BLUE CROSS/BLUE SHIELD | Admitting: Psychiatry

## 2017-12-16 ENCOUNTER — Other Ambulatory Visit: Payer: Self-pay | Admitting: Family Medicine

## 2017-12-16 VITALS — BP 133/68 | HR 67 | Temp 97.4°F | Wt 322.2 lb

## 2017-12-16 DIAGNOSIS — F411 Generalized anxiety disorder: Secondary | ICD-10-CM | POA: Diagnosis not present

## 2017-12-16 DIAGNOSIS — F331 Major depressive disorder, recurrent, moderate: Secondary | ICD-10-CM | POA: Diagnosis not present

## 2017-12-16 MED ORDER — TRAZODONE HCL 50 MG PO TABS: 50.0000 mg | ORAL_TABLET | Freq: Every evening | ORAL | 1 refills | Status: DC | PRN

## 2017-12-16 MED ORDER — BUPROPION HCL 75 MG PO TABS: 75.0000 mg | ORAL_TABLET | Freq: Every morning | ORAL | 1 refills | Status: DC

## 2017-12-16 MED ORDER — HYDROXYZINE PAMOATE 25 MG PO CAPS: 25.0000 mg | ORAL_CAPSULE | Freq: Three times a day (TID) | ORAL | 1 refills | Status: DC | PRN

## 2017-12-16 NOTE — Patient Instructions (Addendum)
Conversion Disorder Conversion disorder is also known as functional neurological symptom disorder. People with this mental disorder have symptoms that suggest a brain or nervous system condition, such as a stroke or seizure. However, no such condition can be found to explain the symptoms. For people with conversion disorder, the symptoms may result in:  Severe distress or anxiety.  Disruption of relationships or other normal life activities.  Medical evaluation. This often occurs in the primary care or emergency room setting.  Conversion disorder may begin anytime from late childhood through the young adult years. This disorder is more common in females than males. What are the causes? The exact cause of this disorder is unknown. It is often triggered by stressful life events involving personal relationships. What are the signs or symptoms? Signs and symptoms of conversion disorder may include:  Muscle weakness or paralysis. If this involves the bladder muscle, it can cause difficulty urinating.  Seizures or attacks of being unresponsive.  Abnormal movement, such as tremors, jerks, muscle spasms, loss of balance, or difficulty walking.  Difficulty swallowing or a feeling of having a lump in the throat.  Loss of speech (aphonia) or slurring of words (dysarthria).  Loss of the sensation of touch or pain.  Changes in vision, such as blindness or double vision.  Seeing things that are not there (hallucinations).  Changes in hearing, such as deafness.  Symptoms usually occur suddenly but may increase gradually over time. They usually go away completely in a short time. Seizures and tremors are more likely than other conversion symptoms to come back or continue. How is this diagnosed? Diagnosis of this disorder starts with an evaluation by your health care provider. Exams and tests will be done to rule out serious physical health problems. The evaluation will vary depending on your  specific symptoms. It may include:  A physical exam, including a neurological exam.  Lab tests on blood or urine samples.  X-rays or other imaging studies.  An electroencephalogram (EEG) to monitor brain waves for seizure activity.  Your health care provider may refer you to a mental health specialist for psychological evaluation. How is this treated? The main goal of treatment is to get the symptoms to go away as quickly as possible. Most people with this disorder respond well to relaxation techniques and reassurance from their health care provider that the symptoms are stress related. For people with symptoms that do not go away, the following treatment options are available:  Counseling or talk therapy. Talk therapy is provided by mental health specialists. It involves discussing stressful life events that may have triggered the symptoms and ways to cope with these issues.  Hypnotherapy. This is the use of hypnosis to help a person overcome conversion symptoms. It is provided by mental health specialists.  Amobarbital interview. This involves discussing stressful life events that may have triggered the symptoms. The mental health specialist asks you questions after you take a short-acting medicine (amobarbital) that produces a hypnotic state.  Medicine. Antidepressant medicine may be helpful even if a person with conversion symptoms is not depressed.  Physical therapy. Physical therapy can help maintain muscle strength in cases of long-term paralysis.  Follow these instructions at home:  Take medicines only as directed by your health care provider.  Try to reduce stress.  Keep all follow-up visits as directed by your health care provider. This is important. Contact a health care provider if:  Your symptoms do not go away or they become severe.  You develop  new symptoms. Get help right away if: You have serious thoughts about hurting yourself or someone else. This information  is not intended to replace advice given to you by your health care provider. Make sure you discuss any questions you have with your health care provider. Document Released: 12/14/2010 Document Revised: 04/18/2016 Document Reviewed: 03/24/2014 Elsevier Interactive Patient Education  2018 [employer].       Hydroxyzine capsules or tablets What is this medicine? HYDROXYZINE (hye Gibson i zeen) is an antihistamine. This medicine is used to treat allergy symptoms. It is also used to treat anxiety and tension. This medicine can be used with other medicines to induce sleep before surgery. This medicine may be used for other purposes; ask your health care provider or pharmacist if you have questions. COMMON BRAND NAME(S): ANX, Atarax, Rezine, Vistaril What should I tell my health care provider before I take this medicine? They need to know if you have any of these conditions: -any chronic illness -difficulty passing urine -glaucoma -heart disease -kidney disease -liver disease -lung disease -an unusual or allergic reaction to hydroxyzine, cetirizine, other medicines, foods, dyes, or preservatives -pregnant or trying to get pregnant -breast-feeding How should I use this medicine? Take this medicine by mouth with a full glass of water. Follow the directions on the prescription label. You may take this medicine with food or on an empty stomach. Take your medicine at regular intervals. Do not take your medicine more often than directed. Talk to your pediatrician regarding the use of this medicine in children. Special care may be needed. While this drug may be prescribed for children as young as 50 years of age for selected conditions, precautions do apply. Patients over 50 years old may have a stronger reaction and need a smaller dose. Overdosage: If you think you have taken too much of this medicine contact a poison control center or emergency room at once. NOTE: This medicine is only for you. Do  not share this medicine with others. What if I miss a dose? If you miss a dose, take it as soon as you can. If it is almost time for your next dose, take only that dose. Do not take double or extra doses. What may interact with this medicine? -alcohol -barbiturate medicines for sleep or seizures -medicines for colds, allergies -medicines for depression, anxiety, or emotional disturbances -medicines for pain -medicines for sleep -muscle relaxants This list may not describe all possible interactions. Give your health care provider a list of all the medicines, herbs, non-prescription drugs, or dietary supplements you use. Also tell them if you smoke, drink alcohol, or use illegal drugs. Some items may interact with your medicine. What should I watch for while using this medicine? Tell your doctor or health care professional if your symptoms do not improve. You may get drowsy or dizzy. Do not drive, use machinery, or do anything that needs mental alertness until you know how this medicine affects you. Do not stand or sit up quickly, especially if you are an older patient. This reduces the risk of dizzy or fainting spells. Alcohol may interfere with the effect of this medicine. Avoid alcoholic drinks. Your mouth may get dry. Chewing sugarless gum or sucking hard candy, and drinking plenty of water may help. Contact your doctor if the problem does not go away or is severe. This medicine may cause dry eyes and blurred vision. If you wear contact lenses you may feel some discomfort. Lubricating drops may help. See your eye  doctor if the problem does not go away or is severe. If you are receiving skin tests for allergies, tell your doctor you are using this medicine. What side effects may I notice from receiving this medicine? Side effects that you should report to your doctor or health care professional as soon as possible: -fast or irregular heartbeat -difficulty passing urine -seizures -slurred  speech or confusion -tremor Side effects that usually do not require medical attention (report to your doctor or health care professional if they continue or are bothersome): -constipation -drowsiness -fatigue -headache -stomach upset This list may not describe all possible side effects. Call your doctor for medical advice about side effects. You may report side effects to FDA at 1-800-FDA-1088. Where should I keep my medicine? Keep out of the reach of children. Store at room temperature between 15 and 30 degrees C (59 and 86 degrees F). Keep container tightly closed. Throw away any unused medicine after the expiration date. NOTE: This sheet is a summary. It may not cover all possible information. If you have questions about this medicine, talk to your doctor, pharmacist, or health care provider.  2018 Elsevier/Gold Standard (2008-03-25 14:50:59) Trazodone tablets What is this medicine? TRAZODONE (TRAZ oh done) is used to treat depression. This medicine may be used for other purposes; ask your health care provider or pharmacist if you have questions. COMMON BRAND NAME(S): Desyrel What should I tell my health care provider before I take this medicine? They need to know if you have any of these conditions: -attempted suicide or thinking about it -bipolar disorder -bleeding problems -glaucoma -heart disease, or previous heart attack -irregular heart beat -kidney or liver disease -low levels of sodium in the blood -an unusual or allergic reaction to trazodone, other medicines, foods, dyes or preservatives -pregnant or trying to get pregnant -breast-feeding How should I use this medicine? Take this medicine by mouth with a glass of water. Follow the directions on the prescription label. Take this medicine shortly after a meal or a light snack. Take your medicine at regular intervals. Do not take your medicine more often than directed. Do not stop taking this medicine suddenly except upon  the advice of your doctor. Stopping this medicine too quickly may cause serious side effects or your condition may worsen. A special MedGuide will be given to you by the pharmacist with each prescription and refill. Be sure to read this information carefully each time. Talk to your pediatrician regarding the use of this medicine in children. Special care may be needed. Overdosage: If you think you have taken too much of this medicine contact a poison control center or emergency room at once. NOTE: This medicine is only for you. Do not share this medicine with others. What if I miss a dose? If you miss a dose, take it as soon as you can. If it is almost time for your next dose, take only that dose. Do not take double or extra doses. What may interact with this medicine? Do not take this medicine with any of the following medications: -certain medicines for fungal infections like fluconazole, itraconazole, ketoconazole, posaconazole, voriconazole -cisapride -dofetilide -dronedarone -linezolid -MAOIs like Carbex, Eldepryl, Marplan, Nardil, and Parnate -mesoridazine -methylene blue (injected into a vein) -pimozide -saquinavir -thioridazine -ziprasidone This medicine may also interact with the following medications: -alcohol -antiviral medicines for HIV or AIDS -aspirin and aspirin-like medicines -barbiturates like phenobarbital -certain medicines for blood pressure, heart disease, irregular heart beat -certain medicines for depression, anxiety, or psychotic disturbances -  certain medicines for migraine headache like almotriptan, eletriptan, frovatriptan, naratriptan, rizatriptan, sumatriptan, zolmitriptan -certain medicines for seizures like carbamazepine and phenytoin -certain medicines for sleep -certain medicines that treat or prevent blood clots like dalteparin, enoxaparin, warfarin -digoxin -fentanyl -lithium -NSAIDS, medicines for pain and inflammation, like ibuprofen or  naproxen -other medicines that prolong the QT interval (cause an abnormal heart rhythm) -rasagiline -supplements like St. John's wort, kava kava, valerian -tramadol -tryptophan This list may not describe all possible interactions. Give your health care provider a list of all the medicines, herbs, non-prescription drugs, or dietary supplements you use. Also tell them if you smoke, drink alcohol, or use illegal drugs. Some items may interact with your medicine. What should I watch for while using this medicine? Tell your doctor if your symptoms do not get better or if they get worse. Visit your doctor or health care professional for regular checks on your progress. Because it may take several weeks to see the full effects of this medicine, it is important to continue your treatment as prescribed by your doctor. Patients and their families should watch out for new or worsening thoughts of suicide or depression. Also watch out for sudden changes in feelings such as feeling anxious, agitated, panicky, irritable, hostile, aggressive, impulsive, severely restless, overly excited and hyperactive, or not being able to sleep. If this happens, especially at the beginning of treatment or after a change in dose, call your health care professional. Dennis Bast may get drowsy or dizzy. Do not drive, use machinery, or do anything that needs mental alertness until you know how this medicine affects you. Do not stand or sit up quickly, especially if you are an older patient. This reduces the risk of dizzy or fainting spells. Alcohol may interfere with the effect of this medicine. Avoid alcoholic drinks. This medicine may cause dry eyes and blurred vision. If you wear contact lenses you may feel some discomfort. Lubricating drops may help. See your eye doctor if the problem does not go away or is severe. Your mouth may get dry. Chewing sugarless gum, sucking hard candy and drinking plenty of water may help. Contact your doctor if  the problem does not go away or is severe. What side effects may I notice from receiving this medicine? Side effects that you should report to your doctor or health care professional as soon as possible: -allergic reactions like skin rash, itching or hives, swelling of the face, lips, or tongue -elevated mood, decreased need for sleep, racing thoughts, impulsive behavior -confusion -fast, irregular heartbeat -feeling faint or lightheaded, falls -feeling agitated, angry, or irritable -loss of balance or coordination -painful or prolonged erections -restlessness, pacing, inability to keep still -suicidal thoughts or other mood changes -tremors -trouble sleeping -seizures -unusual bleeding or bruising Side effects that usually do not require medical attention (report to your doctor or health care professional if they continue or are bothersome): -change in sex drive or performance -change in appetite or weight -constipation -headache -muscle aches or pains -nausea This list may not describe all possible side effects. Call your doctor for medical advice about side effects. You may report side effects to FDA at 1-800-FDA-1088. Where should I keep my medicine? Keep out of the reach of children. Store at room temperature between 15 and 30 degrees C (59 to 86 degrees F). Protect from light. Keep container tightly closed. Throw away any unused medicine after the expiration date. NOTE: This sheet is a summary. It may not cover all possible information. If  you have questions about this medicine, talk to your doctor, pharmacist, or health care provider.  2018 Elsevier/Gold Standard (2016-04-11 16:57:05) Please start taking celexa 20 mg - one tablet for next 3 days and then start taking half of 20 mg for the next 3 days and stop taking it.

## 2017-12-16 NOTE — Progress Notes (Signed)
Psychiatric Initial Adult Assessment   Patient Identification: Erika Hanson MRN:  [id number] Date of Evaluation:  12/16/2017 Referral Source: Vic Blackbird MD Chief Complaint:  ' I am depressed."  Chief Complaint    Establish Care; Depression; Anxiety; Panic Attack; Stress; Fatigue     Visit Diagnosis: R/O Functional neurological symptom disorder   ICD-10-CM   1. GAD (generalized anxiety disorder) F41.1   2. MDD (major depressive disorder), recurrent episode, moderate (HCC) F33.1 buPROPion (WELLBUTRIN) 75 MG tablet    hydrOXYzine (VISTARIL) 25 MG capsule    traZODone (DESYREL) 50 MG tablet    History of Present Illness: Erika Hanson is a 52 year old Caucasian female, divorced, on SSD, lives in [place], has a history of depression, anxiety, somatoform disorder, functional neurological symptom disorder, morbid obesity, vitamin D deficiency, iron deficiency , folate deficiency ,presented to the clinic today to establish care.  Patient reports she was diagnosed with functional neurological disorder by her neurologist in 2016.  She reports that she struggled with some physical symptoms of balance problems, memory issues, dizziness, right-sided weakness, loss of control of her bowel and bladder and so on.  She reports her symptoms can go through flareups which can last for months. She reports that most recently she is going through a flare up it started around November 2018. Reports she went through extensive workup through her primary medical doctor as well as her neurologist.  She reports she went through CT scans and MRIs of her spine and brain .  She reports that initially her providers thought she may have multiple sclerosis.  Her primary medical doctor worked her up and she was found to have vitamin D deficiency, iron deficiency and folate deficiency.  But  they could not give a reason for the extent of her neurological symptoms.  She reports her neurologist Dr.Feraru gave her the diagnosis of  functional neurological symptom disorder.  She reports that made her so anxious and agitated because it did not make sense to her.  She hence was started on medications like Cymbalta for neurological pain as well as anxiety as well as Celexa for mood symptoms and Klonopin for her anxiety attacks.  She reports her provider recently went up on the Cymbalta to 90 mg a few weeks ago.  She has been on Celexa 20 mg since the past 2 years or so.  She has been on the Klonopin since the past few months. She reports she has an EEG pending, but she did not go through with it because she felt like she did not need it.  Reports current depressive symptoms like feeling sad, anhedonia, crying spells, being withdrawn as well as sleep issues.  She reports she has been going through depressive symptoms  since the past several years or so.  She reports her mood symptoms may have started after her divorce in 2008.  She reports her and her husband were together for 15 years or so.  He however cheated on her and left her.  She started dating and started living with her new boyfriend soon after that.  She reports that her son who was 15 years old at that time did not like her boyfriend.  She reports her mother intervened and took her son in to live with her.  She continued to communicate with her son on a regular basis.  She reports her son is now 43 years old, has anger issues as well as blames her for not being in his life.  She struggles with  this on a regular basis.  She also struggles with sleep issues.  She reports she is on Topamax which helps her to wind down.  Topamax was prescribed for neurological pain.  She also reports anxiety symptoms, racing heart rate and so on when she is overwhelmed.  She also struggles with financial stressors.  She reports that her family does not like her living with this current boyfriend and hence family property that should have come to her was not given to her.  She reports that also  bothers her.  She is morbidly obese.  However reports she lost 80 pounds in the last 1 year, intentional.  She also has degenerative disc disease which leads to muscle spasms in her back.  She walks with a slight limp on her right lower extremity.  She reports that as part of her functional neurological symptom disorder.  She also has tremors mostly on her right upper extremities.  She reports they can get worse when she is more anxious.  Denies any perceptual disturbances.  She denies any manic or hypomanic symptoms.  She denies any history of trauma.   Associated Signs/Symptoms: Depression Symptoms:  depressed mood, difficulty concentrating, anxiety, panic attacks, (Hypo) Manic Symptoms:  Denies Anxiety Symptoms:  Excessive Worry, Panic Symptoms, Psychotic Symptoms:  denies PTSD Symptoms: Negative  Past Psychiatric History: She denies any inpatient mental health admissions in the past.  She was started on Celexa 2 years ago by her primary medical doctor.  She was also started on Cymbalta for neurological pain by her neurologist.  She is currently on Cymbalta 90 mg and Celexa 20 mg twice a day. She denies any suicide attempts.  Previous Psychotropic Medications: yes - as noted above   Substance Abuse History in the last 12 months:  No.  Consequences of Substance Abuse: Negative  Past Medical History:  Past Medical History:  Diagnosis Date  . Acid reflux   . Anxiety   . Depression   . Hypertension   . Insomnia   . Morbid obesity ([place])   . Muscle spasm   . Nocturia   . Numbness in feet   . Weakness     Past Surgical History:  Procedure Laterality Date  . CESAREAN SECTION      Family Psychiatric History: Brother-drug abuse , anxiety disorder, depression. Mother - depression . Father-Alzheimer's disease  Family History:  Family History  Problem Relation Age of Onset  . Hypertension Mother   . Depression Mother   . Alzheimer's disease Father   . Diabetes Father    . Hypertension Father   . Drug abuse Brother   . Anxiety disorder Brother   . Depression Brother     Social History:   Social History   Socioeconomic History  . Marital status: Divorced    Spouse name: None  . Number of children: 1  . Years of education: HS  . Highest education level: High school graduate  Social Needs  . Financial resource strain: Not hard at all  . Food insecurity - worry: Never true  . Food insecurity - inability: Never true  . Transportation needs - medical: No  . Transportation needs - non-medical: No  Occupational History  . Occupation: Collects eggs    Comment: not employed  Tobacco Use  . Smoking status: Never Smoker  . Smokeless tobacco: Never Used  Substance and Sexual Activity  . Alcohol use: No  . Drug use: No  . Sexual activity: Yes    Birth  control/protection: None  Other Topics Concern  . None  Social History Narrative   Lives at home with her son.   Right-handed.   8 cups tea and 1 can of [facility] per day.    Additional Social History: She currently lives in [place] with her boyfriend.  She is on SSD.  She reports she was raised mostly by her mother.  Her dad was away in [place] until she was 18 years or so.  After he came back from [place] her parents got divorced .  Her father recently passed away from Alzheimer's dementia-October 2017.  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  She has a 33 year old son.  She does not have a good relationship with him.  Her son was mostly raised by her mother since she and her son's father, her ex-husband had divorced when he was 19 years or so.  Patient was dating someone whom her family did not approve and her son did not like.  Allergies:  No Known Allergies  Metabolic Disorder Labs: Lab Results  Component Value Date   HGBA1C 5.8 (H) 05/21/2017   MPG 120 05/21/2017   No results found for: PROLACTIN Lab Results  Component Value Date   CHOL 212 (H) 05/21/2017   TRIG 146  05/21/2017   HDL 48 (L) 05/21/2017   CHOLHDL 4.4 05/21/2017   VLDL 29 05/21/2017   LDLCALC 135 (H) 05/21/2017     Current Medications: Current Outpatient Medications  Medication Sig Dispense Refill  . albuterol (PROVENTIL HFA;VENTOLIN HFA) 108 (90 Base) MCG/ACT inhaler Inhale 1-2 puffs into the lungs every 4 (four) hours as needed for wheezing or shortness of breath. 1 Inhaler 1  . clonazePAM (KLONOPIN) 0.5 MG tablet TAKE 1 TABLET BY MOUTH TWICE DAILY AS NEEDED FOR ANXIETY 45 tablet 1  . DULoxetine (CYMBALTA) 30 MG capsule Take 1 capsule (30 mg total) by mouth daily. Total of  90mg  daily 30 capsule 6  . DULoxetine (CYMBALTA) 60 MG capsule Take 1 capsule (60 mg total) by mouth daily. 30 capsule 11  . ferrous sulfate 325 (65 FE) MG tablet Take 1 tablet (325 mg total) 2 (two) times daily with a meal by mouth.    . hydrochlorothiazide (HYDRODIURIL) 25 MG tablet TAKE 1 TABLET BY MOUTH ONCE A DAY 30 tablet 6  . LINZESS 145 MCG CAPS capsule TAKE 1 CAPSULE BY MOUTH ONCE DAILY BEFORE BREAKFAST 30 capsule 3  . losartan (COZAAR) 50 MG tablet Take 1 tablet (50 mg total) by mouth at bedtime. 30 tablet 6  . naproxen (NAPROSYN) 500 MG tablet Take 500 mg by mouth 2 (two) times daily as needed.    Marland Kitchen omeprazole (PRILOSEC) 20 MG capsule Take 20 mg by mouth at bedtime.    . polyethylene glycol powder (GLYCOLAX/MIRALAX) powder Take 17 g by mouth 2 (two) times daily as needed. 3350 g 2  . tiZANidine (ZANAFLEX) 4 MG tablet START WITH 1/2 TABLET BY MOUTH DAILY ANDINCREASE TO 1 TABLET 3 TIMES DAILY AS TOLERATED FOR MUSCLE SPASTICITY 90 tablet 0  . topiramate (TOPAMAX) 200 MG tablet Take 1 tablet (200 mg total) by mouth 2 (two) times daily. 180 tablet 2  . buPROPion (WELLBUTRIN) 75 MG tablet Take 1 tablet (75 mg total) by mouth every morning. 30 tablet 1  . hydrOXYzine (VISTARIL) 25 MG capsule Take 1-2 capsules (25-50 mg total) by mouth 3 (three) times daily as needed for anxiety. 120 capsule 1  . traZODone  (DESYREL) 50  MG tablet Take 1-2 tablets (50-100 mg total) by mouth at bedtime as needed for sleep. 60 tablet 1   No current facility-administered medications for this visit.     Neurologic: Headache: No Seizure: No Paresthesias:No  Musculoskeletal: Strength & Muscle Tone: within normal limits Gait & Station: walks with a slight limp  Patient leans: N/A  Psychiatric Specialty Exam: Review of Systems  Musculoskeletal: Positive for back pain and joint pain.  Psychiatric/Behavioral: Positive for depression. The patient is nervous/anxious and has insomnia.   All other systems reviewed and are negative.   Blood pressure 133/68, pulse 67, temperature (!) 97.4 F (36.3 C), temperature source Oral, weight (!) 322 lb 3.2 oz (146.1 kg).Body mass index is 55.31 kg/m.  General Appearance: Casual  Eye Contact:  Fair  Speech:  Clear and Coherent  Volume:  Normal  Mood:  Anxious, Depressed and Dysphoric  Affect:  Appropriate  Thought Process:  Goal Directed and Descriptions of Associations: Intact  Orientation:  Full (Time, Place, and Person)  Thought Content:  Logical  Suicidal Thoughts:  No  Homicidal Thoughts:  No  Memory:  Immediate;   Fair Recent;   Fair Remote;   Fair  Judgement:  Fair  Insight:  Fair  Psychomotor Activity:  Normal  Concentration:  Concentration: Fair and Attention Span: Fair  Recall:  [employer] of Knowledge:Fair  Language: Fair  Akathisia:  No  Handed:  Right  AIMS (if indicated):  NA  Assets:  Communication Skills Desire for Improvement Housing Social Support  ADL's:  Intact  Cognition: WNL  Sleep:  Poor    Treatment Plan Summary: Elan is a 52 year old Caucasian female who has a history of depression, anxiety, functional neurological symptom disorder per hx , presented to the clinic today to establish care.  Erika Hanson struggles with several psychosocial stressors, her own health issues, relational struggles with her son, financial issues, morbid  obesity and so on.  She had multiple workups done including imaging and is currently on vitamin D replacement, folate and iron replacement which was found to be deficient.  Her neurologist diagnosed her with functional neurological symptom disorder since her neurological symptoms could not be explained in her workup/imaging came back negative.  She does have good social port from her boyfriend.  She is motivated to start psychotherapy as well as medication.  She denies any suicidality.  She is a good candidate for outpatient treatment at this time. Medication management and Plan see below   Plan  For depression Continue Cymbalta 90 mg p.o. daily.  Initiated by her neurologist.  The dose was recently increased. Taper of Celexa, gave her written instruction to do so. Start Wellbutrin 75 mg p.o. Daily Start trazodone 50- 100 mg p.o. nightly as needed PHQ 9 equals 18 Refer for CBT  Anxiety symptoms Start hydroxyzine 25-50 mg p.o. 3 times daily as needed Discussed with her to taper off Klonopin.  Discussed with her the risk of being on benzodiazepine therapy for long-term.  She agrees to do so. Continue Cymbalta 90 mg p.o. daily GAD 7 equals 10 Refer for CBT  For hx of  functional neurological symptom disorder Reviewed medical records.  Her neurologist has already diagnosed her with the same per report . (could not however find the specific diagnosis documented ). Had multiple imaging done including CT and MRI scans of her brain and spinal cord.  They all came back within normal limits.  She does have pending EEG.  She reports she does not  want to do that. Provided medication education about functional neurological symptom disorder Discussed with her the importance of psychotherapy as well as coordination of care with her other providers. Continue to monitor.  She does have history of vitamin D deficiency, iron deficiency, folate deficiency which are currently being replaced by her primary care  provider.  Follow-up in 4 weeks or sooner if needed.  More than 50 % of the time was spent for psychoeducation and supportive psychotherapy and care coordination.  This note was generated in part or whole with voice recognition software. Voice recognition is usually quite accurate but there are transcription errors that can and very often do occur. I apologize for any typographical errors that were not detected and corrected.       Ursula Alert, MD 1/23/201910:56 AM

## 2017-12-17 ENCOUNTER — Encounter: Payer: Self-pay | Admitting: Psychiatry

## 2017-12-25 ENCOUNTER — Other Ambulatory Visit: Payer: Self-pay | Admitting: *Deleted

## 2017-12-25 MED ORDER — LINACLOTIDE 290 MCG PO CAPS: 290.0000 ug | ORAL_CAPSULE | Freq: Every day | ORAL | 3 refills | Status: DC

## 2017-12-25 NOTE — Telephone Encounter (Signed)
Received call from patient.   Reports that

## 2017-12-30 ENCOUNTER — Telehealth: Payer: Self-pay | Admitting: Family Medicine

## 2017-12-30 ENCOUNTER — Other Ambulatory Visit: Payer: Self-pay

## 2017-12-30 ENCOUNTER — Ambulatory Visit (INDEPENDENT_AMBULATORY_CARE_PROVIDER_SITE_OTHER): Payer: BLUE CROSS/BLUE SHIELD | Admitting: Family Medicine

## 2017-12-30 ENCOUNTER — Encounter: Payer: Self-pay | Admitting: Family Medicine

## 2017-12-30 VITALS — BP 130/62 | HR 70 | Temp 98.3°F | Resp 14 | Ht 64.0 in | Wt 326.0 lb

## 2017-12-30 DIAGNOSIS — D508 Other iron deficiency anemias: Secondary | ICD-10-CM

## 2017-12-30 DIAGNOSIS — R7303 Prediabetes: Secondary | ICD-10-CM | POA: Diagnosis not present

## 2017-12-30 DIAGNOSIS — F411 Generalized anxiety disorder: Secondary | ICD-10-CM

## 2017-12-30 DIAGNOSIS — E559 Vitamin D deficiency, unspecified: Secondary | ICD-10-CM | POA: Diagnosis not present

## 2017-12-30 DIAGNOSIS — F331 Major depressive disorder, recurrent, moderate: Secondary | ICD-10-CM

## 2017-12-30 MED ORDER — CELECOXIB 100 MG PO CAPS: 100.0000 mg | ORAL_CAPSULE | Freq: Two times a day (BID) | ORAL | 3 refills | Status: DC

## 2017-12-30 NOTE — Assessment & Plan Note (Signed)
She is taking her supplements as prescribed we will recheck her iron levels.  The coolness in her fingertips could be due to the iron deficiency along with the nail changes.  She was worried about ray nods while this is a possibility I would not treat but is not seem very significant.  She has multiple somatic complaints which we have discussed multiple times.  Try to reiterate this to her.  Her anxiety depressive symptoms which time to her somatic complaints are uncontrolled I discussed with her multiple times during the visit the indications for Cymbalta and how the psychiatrist was changing her medications and recommended that she try the change in her regimen as her symptoms were not controlled before she states that she is willing to do so.  For her chronic pain she is going to take the prednisone taper. She then asked what she could take otherwise for her arthritis she has been on Naprosyn for the past couple of years.  We will try her on Celebrex she will start with 100 mg once a day she can increase to twice a day we will see how she does.  Also reiterated the need for continued weight loss which she has been aware of.

## 2017-12-30 NOTE — Assessment & Plan Note (Signed)
Check Vitamin D level 

## 2017-12-30 NOTE — Patient Instructions (Signed)
F/U 4 months  

## 2017-12-30 NOTE — Telephone Encounter (Signed)
Patient is calling to get her her hydrocodone  Sent to [facility]

## 2017-12-30 NOTE — Telephone Encounter (Signed)
Error

## 2017-12-30 NOTE — Progress Notes (Signed)
Subjective:    Patient ID: Erika Hanson, female    DOB: 07-24-1966, 52 y.o.   MRN: [id number]  Patient presents for Follow-up   GAD- on CYmbalta  90mg , saw the psychiatrist who wants to take her off of her Celexa.  She is very upset about this states that she will have anything her anxiety though I tried to explain to her multiple times as Cymbalta also has anxiety indications.  She was also given hydroxyzine she states it makes her sleepy but she does take before bed but she has not taken the Klonopin recently.  She was told to start Wellbutrin as well which she did.  Though she is still harping on the fact that she is coming off of her Celexa and her anxiety will be controlled to her anxiety was not controlled while she was on this medication for quite some time.  She has a follow-up with a psychiatrist in a few weeks.    Insomnia- taking 1 tablet of trazodone this has helped.    Chronic back pain,spinal stenosis- can not afford the epdural injections deductbile , she was given prednisone taper last Friday from orthopedics states that she hurts all over she is worried about her balance and her memory all things that we talked about multiple times all things that she has had discussed with neurology multiple specialist.  States that the surgeon does not want to do surgery on her because of her pain issues and her overactive nervous system.       Iron def anemia-  Due for repeat levels today, on iron tablets  And folate    Gets cold fingertips her nails have ridges in them and seem brittle she thinks that this is due to exposure from previous agent orange in her father.        Borderline diabetes- last A1C  5.8%       Review Of Systems:  GEN- denies fatigue, fever, weight loss,weakness, recent illness HEENT- denies eye drainage, change in vision, nasal discharge, CVS- denies chest pain, palpitations RESP- denies SOB, cough, wheeze ABD- denies N/V, change in stools, abd pain GU-  denies dysuria, hematuria, dribbling, incontinence MSK- +joint pain,+ muscle aches, injury Neuro- denies headache, dizziness, syncope, seizure activity       Objective:    BP 130/62   Pulse 70   Temp 98.3 F (36.8 C) (Oral)   Resp 14   Ht 5\' 4"  (1.626 m)   Wt (!) 326 lb (147.9 kg)   SpO2 100%   BMI 55.96 kg/m  GEN- NAD, alert and oriented x3 HEENT- PERRL, EOMI, non injected sclera, pale  conjunctiva, MMM, oropharynx clearthyromegaly CVS- RRR, no murmur RESP-CTAB Skin- few vertical ridges in nails, no discoloration,  Psych- very anxious, tearful at times, no SI  EXT- No edema Pulses- Radial  2+        Assessment & Plan:      Problem List Items Addressed This Visit      Unprioritized   Borderline diabetes   Relevant Orders   Hemoglobin A1c   GAD (generalized anxiety disorder)   MDD (major depressive disorder) - Primary   Vitamin D deficiency    Check Vitamin D level      Relevant Orders   Vitamin D, 25-hydroxy   Anemia, iron deficiency    She is taking her supplements as prescribed we will recheck her iron levels.  The coolness in her fingertips could be due to the iron deficiency  along with the nail changes.  She was worried about ray nods while this is a possibility I would not treat but is not seem very significant.  She has multiple somatic complaints which we have discussed multiple times.  Try to reiterate this to her.  Her anxiety depressive symptoms which time to her somatic complaints are uncontrolled I discussed with her multiple times during the visit the indications for Cymbalta and how the psychiatrist was changing her medications and recommended that she try the change in her regimen as her symptoms were not controlled before she states that she is willing to do so.  For her chronic pain she is going to take the prednisone taper. She then asked what she could take otherwise for her arthritis she has been on Naprosyn for the past couple of years.  We  will try her on Celebrex she will start with 100 mg once a day she can increase to twice a day we will see how she does.  Also reiterated the need for continued weight loss which she has been aware of.      Relevant Orders   CBC with Differential/Platelet   Comprehensive metabolic panel   Iron, TIBC and Ferritin Panel      Note: This dictation was prepared with Dragon dictation along with smaller phrase technology. Any transcriptional errors that result from this process are unintentional.

## 2017-12-31 LAB — COMPREHENSIVE METABOLIC PANEL
AG RATIO: 1.5 (calc) (ref 1.0–2.5)
ALBUMIN MSPROF: 3.8 g/dL (ref 3.6–5.1)
ALT: 12 U/L (ref 6–29)
AST: 11 U/L (ref 10–35)
Alkaline phosphatase (APISO): 91 U/L (ref 33–115)
BUN / CREAT RATIO: 21 (calc) (ref 6–22)
BUN: 24 mg/dL (ref 7–25)
CHLORIDE: 107 mmol/L (ref 98–110)
CO2: 24 mmol/L (ref 20–32)
Calcium: 9.4 mg/dL (ref 8.6–10.2)
Creat: 1.17 mg/dL — ABNORMAL HIGH (ref 0.50–1.10)
GLOBULIN: 2.5 g/dL (ref 1.9–3.7)
GLUCOSE: 78 mg/dL (ref 65–99)
POTASSIUM: 4 mmol/L (ref 3.5–5.3)
SODIUM: 141 mmol/L (ref 135–146)
Total Bilirubin: 0.2 mg/dL (ref 0.2–1.2)
Total Protein: 6.3 g/dL (ref 6.1–8.1)

## 2017-12-31 LAB — CBC WITH DIFFERENTIAL/PLATELET
BASOS PCT: 0.6 %
Basophils Absolute: 40 cells/uL (ref 0–200)
EOS ABS: 221 {cells}/uL (ref 15–500)
Eosinophils Relative: 3.3 %
HCT: 39.1 % (ref 35.0–45.0)
HEMOGLOBIN: 12.9 g/dL (ref 11.7–15.5)
Lymphs Abs: 1434 cells/uL (ref 850–3900)
MCH: 26.6 pg — AB (ref 27.0–33.0)
MCHC: 33 g/dL (ref 32.0–36.0)
MCV: 80.6 fL (ref 80.0–100.0)
MPV: 9.4 fL (ref 7.5–12.5)
Monocytes Relative: 7.7 %
NEUTROS ABS: 4489 {cells}/uL (ref 1500–7800)
Neutrophils Relative %: 67 %
PLATELETS: 262 10*3/uL (ref 140–400)
RBC: 4.85 10*6/uL (ref 3.80–5.10)
RDW: 17.4 % — ABNORMAL HIGH (ref 11.0–15.0)
TOTAL LYMPHOCYTE: 21.4 %
WBC mixed population: 516 cells/uL (ref 200–950)
WBC: 6.7 10*3/uL (ref 3.8–10.8)

## 2017-12-31 LAB — IRON,TIBC AND FERRITIN PANEL
%SAT: 12 % (ref 11–50)
FERRITIN: 13 ng/mL (ref 10–232)
IRON: 41 ug/dL (ref 40–190)
TIBC: 351 mcg/dL (calc) (ref 250–450)

## 2017-12-31 LAB — VITAMIN D 25 HYDROXY (VIT D DEFICIENCY, FRACTURES): Vit D, 25-Hydroxy: 22 ng/mL — ABNORMAL LOW (ref 30–100)

## 2017-12-31 LAB — HEMOGLOBIN A1C
EAG (MMOL/L): 5.8 (calc)
Hgb A1c MFr Bld: 5.3 % of total Hgb (ref <5.7)
Mean Plasma Glucose: 105 (calc)

## 2018-01-01 ENCOUNTER — Ambulatory Visit: Payer: BLUE CROSS/BLUE SHIELD | Admitting: Licensed Clinical Social Worker

## 2018-01-13 ENCOUNTER — Encounter: Payer: Self-pay | Admitting: Psychiatry

## 2018-01-13 ENCOUNTER — Other Ambulatory Visit: Payer: Self-pay

## 2018-01-13 ENCOUNTER — Ambulatory Visit: Payer: BLUE CROSS/BLUE SHIELD | Admitting: Psychiatry

## 2018-01-13 VITALS — BP 141/81 | HR 72 | Temp 97.4°F | Wt 325.2 lb

## 2018-01-13 DIAGNOSIS — F331 Major depressive disorder, recurrent, moderate: Secondary | ICD-10-CM | POA: Diagnosis not present

## 2018-01-13 DIAGNOSIS — F411 Generalized anxiety disorder: Secondary | ICD-10-CM

## 2018-01-13 MED ORDER — BUSPIRONE HCL 10 MG PO TABS
10.0000 mg | ORAL_TABLET | Freq: Three times a day (TID) | ORAL | 1 refills | Status: DC
Start: 2018-01-13 — End: 2018-02-10

## 2018-01-13 MED ORDER — BUPROPION HCL ER (XL) 150 MG PO TB24: 150.0000 mg | ORAL_TABLET | Freq: Every day | ORAL | 1 refills | Status: DC

## 2018-01-13 NOTE — Patient Instructions (Signed)
Buspirone tablets What is this medicine? BUSPIRONE (byoo SPYE rone) is used to treat anxiety disorders. This medicine may be used for other purposes; ask your health care provider or pharmacist if you have questions. COMMON BRAND NAME(S): BuSpar What should I tell my health care provider before I take this medicine? They need to know if you have any of these conditions: -kidney or liver disease -an unusual or allergic reaction to buspirone, other medicines, foods, dyes, or preservatives -pregnant or trying to get pregnant -breast-feeding How should I use this medicine? Take this medicine by mouth with a glass of water. Follow the directions on the prescription label. You may take this medicine with or without food. To ensure that this medicine always works the same way for you, you should take it either always with or always without food. Take your doses at regular intervals. Do not take your medicine more often than directed. Do not stop taking except on the advice of your doctor or health care professional. Talk to your pediatrician regarding the use of this medicine in children. Special care may be needed. Overdosage: If you think you have taken too much of this medicine contact a poison control center or emergency room at once. NOTE: This medicine is only for you. Do not share this medicine with others. What if I miss a dose? If you miss a dose, take it as soon as you can. If it is almost time for your next dose, take only that dose. Do not take double or extra doses. What may interact with this medicine? Do not take this medicine with any of the following medications: -linezolid -MAOIs like Carbex, Eldepryl, Marplan, Nardil, and Parnate -methylene blue -procarbazine This medicine may also interact with the following medications: -diazepam -digoxin -diltiazem -erythromycin -grapefruit juice -haloperidol -medicines for mental depression or mood problems -medicines for seizures like  carbamazepine, phenobarbital and phenytoin -nefazodone -other medications for anxiety -rifampin -ritonavir -some antifungal medicines like itraconazole, ketoconazole, and voriconazole -verapamil -warfarin This list may not describe all possible interactions. Give your health care provider a list of all the medicines, herbs, non-prescription drugs, or dietary supplements you use. Also tell them if you smoke, drink alcohol, or use illegal drugs. Some items may interact with your medicine. What should I watch for while using this medicine? Visit your doctor or health care professional for regular checks on your progress. It may take 1 to 2 weeks before your anxiety gets better. You may get drowsy or dizzy. Do not drive, use machinery, or do anything that needs mental alertness until you know how this drug affects you. Do not stand or sit up quickly, especially if you are an older patient. This reduces the risk of dizzy or fainting spells. Alcohol can make you more drowsy and dizzy. Avoid alcoholic drinks. What side effects may I notice from receiving this medicine? Side effects that you should report to your doctor or health care professional as soon as possible: -blurred vision or other vision changes -chest pain -confusion -difficulty breathing -feelings of hostility or anger -muscle aches and pains -numbness or tingling in hands or feet -ringing in the ears -skin rash and itching -vomiting -weakness Side effects that usually do not require medical attention (report to your doctor or health care professional if they continue or are bothersome): -disturbed dreams, nightmares -headache -nausea -restlessness or nervousness -sore throat and nasal congestion -stomach upset This list may not describe all possible side effects. Call your doctor for medical advice about side   effects. You may report side effects to FDA at 1-800-FDA-1088. Where should I keep my medicine? Keep out of the reach  of children. Store at room temperature below 30 degrees C (86 degrees F). Protect from light. Keep container tightly closed. Throw away any unused medicine after the expiration date. NOTE: This sheet is a summary. It may not cover all possible information. If you have questions about this medicine, talk to your doctor, pharmacist, or health care provider.  2018 Elsevier/Gold Standard (2010-06-21 18:06:11)  

## 2018-01-13 NOTE — Progress Notes (Signed)
Man MD OP Progress Note  01/13/2018 12:14 PM Erika Hanson  MRN:  [id number]  Chief Complaint: ' I am anxious and depressed."  Chief Complaint    Follow-up; Medication Refill     HPI: Erika Hanson is a 52 year old Caucasian female, divorced, on SSD, lives in [place], has a history of depression, anxiety, somatoform disorder, functional neurological symptom disorder, morbid obesity, vitamin D deficiency, iron deficiency, folate deficiency, chronic pain, presented to the clinic today for a follow-up visit.  Today reports that she continues to struggle with anxiety and depression on a regular basis.  She reports she does not think the Cymbalta is actually effective with managing her anxiety or depression at this time.  She however reports she wants to continue the Cymbalta since it helps with her neurological pain.  The patient reports she started taking the Wellbutrin but has not noticed a big difference with her depressive symptoms yet.  She reports she continues to struggle with racing heart rate, nervousness as well as had one episode when she had to leave her house because she felt like someone was going to kill her in her house.  She reports she drove away from her house at midnight and stayed in her car the rest of the night because she was so anxious.  Attempted to clarify with patient whether she was paranoid however she reports that she was just worried and anxious and it happened only once.  She reports she does not feel that way anymore.  She reports she continues to struggle with social anxiety symptoms on a regular basis.  She reports she struggles going out alone.  She reports she has to be accompanied by her mother or someone else when she is out.  She reports she copes by taking a Klonopin as needed when she goes to social situations.  She reports sleep is good on the current medication.  She reports trazodone helps her with her sleep.  She continues to struggle with some neurological  symptoms.  She reports currently she is struggling with some phantom smells as well as tongue numbness and so on.  She also reports her hands also goes numb and tingles on and off especially when she uses it for coloring or doing something.  She however reports that could also be due to her neck issues, she does have bulging disc and wonders whether it could be due to the same.  Reports she had to reschedule her appointment for CBT with Ms. Peacock.  Discussed with her the importance of having psychotherapy more frequently and on an intensive basis.  She has an appointment next week to start her therapy.  She will discuss with Ms. Peacock if she can be seen frequently.   Visit Diagnosis: R/O Functional neurological symptom disorder   ICD-10-CM   1. GAD (generalized anxiety disorder) F41.1 buPROPion (WELLBUTRIN XL) 150 MG 24 hr tablet    busPIRone (BUSPAR) 10 MG tablet  2. MDD (major depressive disorder), recurrent episode, moderate (HCC) F33.1 buPROPion (WELLBUTRIN XL) 150 MG 24 hr tablet    busPIRone (BUSPAR) 10 MG tablet    Past Psychiatric History: Denies any inpatient mental health admissions in the past.  She was started on Celexa 2 years ago by her primary medical doctor.  She was also started on Cymbalta for neurological pain by her neurologist.  Denies any suicide attempts.  Reports previous trials of Paxil, Celexa, Cymbalta.  Past Medical History:  Past Medical History:  Diagnosis Date  . Acid  reflux   . Anxiety   . Depression   . Hypertension   . Insomnia   . Morbid obesity ([place])   . Muscle spasm   . Nocturia   . Numbness in feet   . Weakness     Past Surgical History:  Procedure Laterality Date  . CESAREAN SECTION      Family Psychiatric History: Brother-drug abuse, anxiety disorder, depression.  Mother-depression.  Father-Alzheimer's disease  Family History:  Family History  Problem Relation Age of Onset  . Hypertension Mother   . Depression Mother   . Alzheimer's  disease Father   . Diabetes Father   . Hypertension Father   . Drug abuse Brother   . Anxiety disorder Brother   . Depression Brother    Substance abuse history: Denies  Social History: She lives in [place] with her boyfriend.  She is on SSD.  She reports she was raised mostly by her mother.  Her dad was away in [place] until she was 18 years or so.  After he came back from [place] her parents got divorced.  Her father recently passed away from Alzheimer's dementia-October 2017.  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  She has a 19 year old son.  She does not have a good relationship with him.  Her son was mostly raised by her mother since she and her son's father, her ex-husband had divorced when he was 28 years or so.  Patient was dating someone whom her family did not approve and her son did not like at that time Social History   Socioeconomic History  . Marital status: Divorced    Spouse name: None  . Number of children: 1  . Years of education: HS  . Highest education level: High school graduate  Social Needs  . Financial resource strain: Not hard at all  . Food insecurity - worry: Never true  . Food insecurity - inability: Never true  . Transportation needs - medical: No  . Transportation needs - non-medical: No  Occupational History  . Occupation: Collects eggs    Comment: not employed  Tobacco Use  . Smoking status: Never Smoker  . Smokeless tobacco: Never Used  Substance and Sexual Activity  . Alcohol use: No  . Drug use: No  . Sexual activity: Yes    Birth control/protection: None  Other Topics Concern  . None  Social History Narrative   Lives at home with her son.   Right-handed.   8 cups tea and 1 can of [facility] per day.    Allergies: No Known Allergies  Metabolic Disorder Labs: Lab Results  Component Value Date   HGBA1C 5.3 12/30/2017   MPG 105 12/30/2017   MPG 120 05/21/2017   No results found for: PROLACTIN Lab Results  Component  Value Date   CHOL 212 (H) 05/21/2017   TRIG 146 05/21/2017   HDL 48 (L) 05/21/2017   CHOLHDL 4.4 05/21/2017   VLDL 29 05/21/2017   LDLCALC 135 (H) 05/21/2017   Lab Results  Component Value Date   TSH 4.060 02/17/2017    Therapeutic Level Labs: No results found for: LITHIUM No results found for: VALPROATE No components found for:  CBMZ  Current Medications: Current Outpatient Medications  Medication Sig Dispense Refill  . albuterol (PROVENTIL HFA;VENTOLIN HFA) 108 (90 Base) MCG/ACT inhaler Inhale 1-2 puffs into the lungs every 4 (four) hours as needed for wheezing or shortness of breath. 1 Inhaler 1  . buPROPion (  WELLBUTRIN XL) 150 MG 24 hr tablet Take 1 tablet (150 mg total) by mouth daily. 30 tablet 1  . busPIRone (BUSPAR) 10 MG tablet Take 1 tablet (10 mg total) by mouth 3 (three) times daily. 90 tablet 1  . celecoxib (CELEBREX) 100 MG capsule Take 1 capsule (100 mg total) by mouth 2 (two) times daily. 60 capsule 3  . clonazePAM (KLONOPIN) 0.5 MG tablet TAKE 1 TABLET BY MOUTH TWICE DAILY AS NEEDED FOR ANXIETY 45 tablet 1  . DULoxetine (CYMBALTA) 30 MG capsule Take 1 capsule (30 mg total) by mouth daily. Total of  90mg  daily 30 capsule 6  . DULoxetine (CYMBALTA) 60 MG capsule Take 1 capsule (60 mg total) by mouth daily. 30 capsule 11  . ferrous sulfate 325 (65 FE) MG tablet Take 1 tablet (325 mg total) 2 (two) times daily with a meal by mouth.    . hydrochlorothiazide (HYDRODIURIL) 25 MG tablet TAKE 1 TABLET BY MOUTH ONCE A DAY 30 tablet 6  . linaclotide (LINZESS) 290 MCG CAPS capsule Take 1 capsule (290 mcg total) by mouth daily before breakfast. 30 capsule 3  . losartan (COZAAR) 50 MG tablet Take 1 tablet (50 mg total) by mouth at bedtime. 30 tablet 6  . omeprazole (PRILOSEC) 20 MG capsule Take 20 mg by mouth at bedtime.    . polyethylene glycol powder (GLYCOLAX/MIRALAX) powder Take 17 g by mouth 2 (two) times daily as needed. 3350 g 2  . tiZANidine (ZANAFLEX) 4 MG tablet START  WITH 1/2 TABLET BY MOUTH DAILY ANDINCREASE TO 1 TABLET 3 TIMES DAILY AS TOLERATED FOR MUSCLE SPASTICITY 90 tablet 0  . topiramate (TOPAMAX) 200 MG tablet Take 1 tablet (200 mg total) by mouth 2 (two) times daily. 180 tablet 2  . traZODone (DESYREL) 50 MG tablet Take 1-2 tablets (50-100 mg total) by mouth at bedtime as needed for sleep. 60 tablet 1   No current facility-administered medications for this visit.      Musculoskeletal: Strength & Muscle Tone: within normal limits Gait & Station: normal Patient leans: N/A  Psychiatric Specialty Exam: Review of Systems  Psychiatric/Behavioral: Positive for depression. The patient is nervous/anxious.   All other systems reviewed and are negative.   Blood pressure (!) 141/81, pulse 72, temperature (!) 97.4 F (36.3 C), temperature source Oral, weight (!) 325 lb 3.2 oz (147.5 kg).Body mass index is 55.82 kg/m.  General Appearance: Casual  Eye Contact:  Fair  Speech:  Normal Rate  Volume:  Normal  Mood:  Anxious and Depressed  Affect:  Congruent  Thought Process:  Goal Directed and Descriptions of Associations: Intact  Orientation:  Full (Time, Place, and Person)  Thought Content: Logical   Suicidal Thoughts:  No  Homicidal Thoughts:  No  Memory:  Immediate;   Fair Recent;   Fair Remote;   Fair  Judgement:  Fair  Insight:  Fair  Psychomotor Activity:  Normal  Concentration:  Concentration: Fair and Attention Span: Fair  Recall:  [employer] of Knowledge: Fair  Language: Fair  Akathisia:  No  Handed:  Right  AIMS (if indicated): NA  Assets:  Communication Skills Desire for Improvement Housing Intimacy Social Support  ADL's:  Intact  Cognition: WNL  Sleep:  Fair   Screenings: PHQ2-9     Office Visit from 12/02/2017 in [facility] Office Visit from 09/22/2017 in [facility] Office Visit from 08/27/2017 in [facility] Visit from 05/21/2017 in [facility]  PHQ-2 Total Score  0  0  0  0  0  PHQ-9 Total Score  No data  No data  No data  No data  2       Assessment and Plan: Ileana is a 52 year old Caucasian female who has a history of depression, anxiety, functional neurological symptom disorder per history, presented to the clinic today for a follow-up visit.  Irem continues to struggle with several psychosocial stressors, her own health issues, relational struggles with her son, financial issues, morbid obesity and so on.  She had multiple workups done including imaging and is currently on vitamin D replacement, folate and iron replacement which were found to be deficient.  Her neurologist diagnosed her with functional neurological symptom disorder since her neurological symptoms could not be explained by her workup or imaging . She continues  to have social support from her family, boyfriend.  She continues to struggle with depression and anxiety and hence medication readjustments were discussed with patient.  She currently denies any suicidality.  Patient has been referred for CBT, she will see Ms. Peacock next week.  Continue plan as noted below  Plan  Depression Continue Cymbalta 90 mg p.o. daily.  Initiated by her neurologist.  She reports it helps with her neurological pain also. Celexa has been tapered down and discontinued. Increase Wellbutrin XL to 150 mg p.o. daily Continue trazodone 50 -100 mg p.o. nightly as needed for sleep.  For anxiety symptoms Discontinue hydroxyzine for side effects. BuSpar 10 mg 3 times daily. Discussed with her to taper herself off of the Klonopin gradually.  She reports she uses it when she goes out in social situations. Discussed risk of being on benzodiazepine therapy for long-term. She will pursue psychotherapy with Ms. Peacock, appointment is next week.   History of functional neurological symptom disorder Reviewed medical  records.  Her neurologist already diagnosed her with the same per report.  Could not however find the specific diagnosis documented in her EHR.  Had multiple imaging done including CT and MRI scans of her brain and spinal cord.  They all came back within normal limits.  She does have pending EEG.  She reports she does not want to do that. Provided medication education about functional neurological symptom disorder.  Discussed with her about the significance of pursuing psychotherapy to address her symptoms.  We will continue to monitor closely.  She continues to follow-up with her primary medical doctor for history of vitamin D deficiency, iron deficiency, folate deficiency, morbid obesity, chronic pain as well as neurological symptoms due to bulging disc.  Follow-up in clinic in 1 month or sooner if needed.  More than 50 % of the time was spent for psychoeducation and supportive psychotherapy and care coordination.   Ursula Alert, MD 01/13/2018, 12:14 PM

## 2018-01-14 ENCOUNTER — Encounter: Payer: Self-pay | Admitting: Psychiatry

## 2018-01-15 ENCOUNTER — Other Ambulatory Visit: Payer: Self-pay | Admitting: Family Medicine

## 2018-01-15 NOTE — Telephone Encounter (Signed)
Ok to refill 

## 2018-01-16 ENCOUNTER — Telehealth: Payer: Self-pay | Admitting: *Deleted

## 2018-01-16 NOTE — Telephone Encounter (Signed)
Noted  

## 2018-01-16 NOTE — Telephone Encounter (Signed)
Received fax requesting refill on Naproxen.   Medication is no longer on list.   Ok to fill Naproxen 500mg  (1) tab PO BID PRN #[id number]?

## 2018-01-16 NOTE — Telephone Encounter (Signed)
No naproxen she is on Celebrex

## 2018-01-20 ENCOUNTER — Ambulatory Visit: Payer: BLUE CROSS/BLUE SHIELD | Admitting: Licensed Clinical Social Worker

## 2018-01-20 DIAGNOSIS — F411 Generalized anxiety disorder: Secondary | ICD-10-CM

## 2018-01-20 DIAGNOSIS — F331 Major depressive disorder, recurrent, moderate: Secondary | ICD-10-CM
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
## 2018-01-21 ENCOUNTER — Ambulatory Visit: Payer: BLUE CROSS/BLUE SHIELD | Admitting: Family Medicine

## 2018-02-06 ENCOUNTER — Encounter: Payer: Self-pay | Admitting: Family Medicine

## 2018-02-06 ENCOUNTER — Ambulatory Visit: Payer: BLUE CROSS/BLUE SHIELD | Admitting: Family Medicine

## 2018-02-06 ENCOUNTER — Other Ambulatory Visit: Payer: Self-pay | Admitting: Psychiatry

## 2018-02-06 ENCOUNTER — Ambulatory Visit (INDEPENDENT_AMBULATORY_CARE_PROVIDER_SITE_OTHER): Payer: BLUE CROSS/BLUE SHIELD | Admitting: Family Medicine

## 2018-02-06 VITALS — BP 156/100 | HR 66 | Temp 97.5°F | Resp 18 | Ht 64.0 in | Wt 325.0 lb

## 2018-02-06 DIAGNOSIS — J019 Acute sinusitis, unspecified: Secondary | ICD-10-CM | POA: Diagnosis not present

## 2018-02-06 DIAGNOSIS — F331 Major depressive disorder, recurrent, moderate: Secondary | ICD-10-CM

## 2018-02-06 DIAGNOSIS — F411 Generalized anxiety disorder: Secondary | ICD-10-CM

## 2018-02-06 DIAGNOSIS — B9689 Other specified bacterial agents as the cause of diseases classified elsewhere: Secondary | ICD-10-CM

## 2018-02-06 MED ORDER — FLUCONAZOLE 150 MG PO TABS: 150.0000 mg | ORAL_TABLET | Freq: Once | ORAL | 0 refills | Status: AC

## 2018-02-06 MED ORDER — AMOXICILLIN 875 MG PO TABS
875.0000 mg | ORAL_TABLET | Freq: Two times a day (BID) | ORAL | 0 refills | Status: DC
Start: 2018-02-06 — End: 2018-03-19

## 2018-02-06 NOTE — Progress Notes (Signed)
Subjective:    Patient ID: Erika Hanson, female    DOB: June 27, 1966, 52 y.o.   MRN: [id number]  HPI Patient presents with 1 week of head congestion, she reports pain and pressure in her left frontal and left maxillary sinus.  She reports subjective fevers, rhinorrhea, postnasal drip, and cough.  She reports some stiffness and pain in the left side of her trapezius.  She denies any chest pain shortness of breath or dyspnea on exertion Past Medical History:  Diagnosis Date  . Acid reflux   . Anxiety   . Depression   . Hypertension   . Insomnia   . Morbid obesity ([place])   . Muscle spasm   . Nocturia   . Numbness in feet   . Weakness    Past Surgical History:  Procedure Laterality Date  . CESAREAN SECTION     Current Outpatient Medications on File Prior to Visit  Medication Sig Dispense Refill  . albuterol (PROVENTIL HFA;VENTOLIN HFA) 108 (90 Base) MCG/ACT inhaler Inhale 1-2 puffs into the lungs every 4 (four) hours as needed for wheezing or shortness of breath. 1 Inhaler 1  . buPROPion (WELLBUTRIN XL) 150 MG 24 hr tablet Take 1 tablet (150 mg total) by mouth daily. 30 tablet 1  . busPIRone (BUSPAR) 10 MG tablet Take 1 tablet (10 mg total) by mouth 3 (three) times daily. 90 tablet 1  . celecoxib (CELEBREX) 100 MG capsule Take 1 capsule (100 mg total) by mouth 2 (two) times daily. 60 capsule 3  . clonazePAM (KLONOPIN) 0.5 MG tablet TAKE 1 TABLET BY MOUTH TWICE DAILY AS NEEDED FOR ANXIETY 45 tablet 1  . DULoxetine (CYMBALTA) 30 MG capsule Take 1 capsule (30 mg total) by mouth daily. Total of  90mg  daily 30 capsule 6  . DULoxetine (CYMBALTA) 60 MG capsule Take 1 capsule (60 mg total) by mouth daily. 30 capsule 11  . ferrous sulfate 325 (65 FE) MG tablet Take 1 tablet (325 mg total) 2 (two) times daily with a meal by mouth.    . hydrochlorothiazide (HYDRODIURIL) 25 MG tablet TAKE 1 TABLET BY MOUTH ONCE A DAY 30 tablet 6  . linaclotide (LINZESS) 290 MCG CAPS capsule Take 1 capsule (290  mcg total) by mouth daily before breakfast. 30 capsule 3  . losartan (COZAAR) 50 MG tablet Take 1 tablet (50 mg total) by mouth at bedtime. 30 tablet 6  . omeprazole (PRILOSEC) 20 MG capsule Take 20 mg by mouth at bedtime.    . polyethylene glycol powder (GLYCOLAX/MIRALAX) powder Take 17 g by mouth 2 (two) times daily as needed. 3350 g 2  . tiZANidine (ZANAFLEX) 4 MG tablet START WITH 1/2 TABLET BY MOUTH DAILY ANDINCREASE TO 1 TABLET 3 TIMES DAILY AS TOLERATED FOR MUSCLE SPASTICITY 90 tablet 0  . topiramate (TOPAMAX) 200 MG tablet Take 1 tablet (200 mg total) by mouth 2 (two) times daily. 180 tablet 2  . traZODone (DESYREL) 50 MG tablet Take 1-2 tablets (50-100 mg total) by mouth at bedtime as needed for sleep. 60 tablet 1   No current facility-administered medications on file prior to visit.    No Known Allergies Social History   Socioeconomic History  . Marital status: Divorced    Spouse name: Not on file  . Number of children: 1  . Years of education: HS  . Highest education level: High school graduate  Social Needs  . Financial resource strain: Not hard at all  . Food insecurity - worry: Never  true  . Food insecurity - inability: Never true  . Transportation needs - medical: No  . Transportation needs - non-medical: No  Occupational History  . Occupation: Collects eggs    Comment: not employed  Tobacco Use  . Smoking status: Never Smoker  . Smokeless tobacco: Never Used  Substance and Sexual Activity  . Alcohol use: No  . Drug use: No  . Sexual activity: Yes    Birth control/protection: None  Other Topics Concern  . Not on file  Social History Narrative   Lives at home with her son.   Right-handed.   8 cups tea and 1 can of [facility] per day.      Review of Systems  All other systems reviewed and are negative.      Objective:   Physical Exam  Constitutional: She appears well-developed and well-nourished. No distress.  HENT:  Right Ear: Tympanic membrane  and ear canal normal.  Left Ear: Tympanic membrane and ear canal normal.  Nose: Mucosal edema and rhinorrhea present. Left sinus exhibits maxillary sinus tenderness and frontal sinus tenderness.  Neck: Neck supple.  Cardiovascular: Normal rate, regular rhythm and normal heart sounds.  No murmur heard. Pulmonary/Chest: Effort normal and breath sounds normal. No respiratory distress. She has no wheezes. She has no rales.  Skin: She is not diaphoretic.  Vitals reviewed.         Assessment & Plan:  Acute bacterial rhinosinusitis  I believe the patient has a sinus infection.  Begin amoxicillin 875 mg p.o. twice daily for 10 days.  Use Diflucan if she develops a yeast infection.  Recommended she check her blood pressure frequently at home.  If persistently elevated greater than 140/90, increase losartan to 100 mg a day and follow-up with her PCP

## 2018-02-10 ENCOUNTER — Other Ambulatory Visit: Payer: Self-pay

## 2018-02-10 ENCOUNTER — Ambulatory Visit: Payer: BLUE CROSS/BLUE SHIELD | Admitting: Psychiatry

## 2018-02-10 ENCOUNTER — Encounter: Payer: Self-pay | Admitting: Psychiatry

## 2018-02-10 VITALS — BP 146/93 | HR 82 | Temp 97.4°F | Wt 325.4 lb

## 2018-02-10 DIAGNOSIS — F411 Generalized anxiety disorder: Secondary | ICD-10-CM | POA: Diagnosis not present

## 2018-02-10 DIAGNOSIS — F410 Panic disorder [episodic paroxysmal anxiety] without agoraphobia: Secondary | ICD-10-CM

## 2018-02-10 DIAGNOSIS — F331 Major depressive disorder, recurrent, moderate: Secondary | ICD-10-CM

## 2018-02-10 MED ORDER — TRAZODONE HCL 50 MG PO TABS: 50.0000 mg | ORAL_TABLET | Freq: Every evening | ORAL | 1 refills | Status: DC | PRN

## 2018-02-10 MED ORDER — BUSPIRONE HCL 15 MG PO TABS: 15.0000 mg | ORAL_TABLET | Freq: Three times a day (TID) | ORAL | 1 refills | Status: DC

## 2018-02-10 NOTE — Progress Notes (Signed)
[facility] MD OP Progress Note  02/10/2018 12:35 PM Erika Hanson  MRN:  [id number]  Chief Complaint: ' I am ok." Chief Complaint    Follow-up; Medication Refill     HPI: Erika Hanson is a 52 y old CF, who is divorced, on SSD, lives in [place], has a history of depression, anxiety disorder, somatoform disorder, functional neurological symptom disorder, morbid obesity, vitamin D deficiency, iron deficiency, folate deficiency, chronic pain, presented to the clinic today for a follow-up visit  Patient reports she is feeling a little better with regards to her anxiety and depression.  She reports she continues to be compliant on her Cymbalta as well as the Wellbutrin.  She reports the Wellbutrin is actually helping her .  She continues to struggle with panic symptoms.  She reports that there are  times she has tingling and numbness all over her hands and lower extremities.  She reports she had a spell recently at [facility].  She reports she had to walk up to her car and sit down in her car for a few minutes for it to pass.  She reports she continues to take Klonopin as needed for her social anxiety as well as panic symptoms.  She reports she currently takes it 2 or 3 times per week.  She is trying to limit use.  She reports sleep is good.  She reports she has some anger and irritability issues.  She reports she can go from 0-60 in few seconds.  She wonders whether it is due to her anxiety and depression.  Discussed with her that her irritability may be due to anxiety and depression or could be a side effect of medications like Wellbutrin.  Discussed with her that since she is scheduled to be in intensive outpatient program soon, will not add a mood stabilizer yet.  Discussed with her that her BuSpar can be increased today and then she can monitor her sx as well as see if the IOP helps to stabilize her mood.  Patient continues to have neurological symptoms like having phantom smells, Burnt sensation on her tongue,  loss of bowel and bladder function, zoning out, feeling confused, tingling sensation on her hands as well as her lower extremities and so on.  She reports she would like to follow-up with neurologist who would be an expert in functional neurological symptom disorder.  Discussed with her that FND is psychiatric diagnosis and that she needs a multidisciplinary treatment approach.  Discussed with her to continue to follow-up with her PMD, neurologist.  She will start IOP soon and she is already in therapy with Ms. Peacock for individual sessions.     Visit Diagnosis: R/O Functional neurological symptom disorder   ICD-10-CM   1. GAD (generalized anxiety disorder) F41.1 busPIRone (BUSPAR) 15 MG tablet  2. MDD (major depressive disorder), recurrent episode, moderate (HCC) F33.1 busPIRone (BUSPAR) 15 MG tablet    traZODone (DESYREL) 50 MG tablet  3. Panic attacks F41.0 busPIRone (BUSPAR) 15 MG tablet    Past Psychiatric History: Denies any inpatient mental health admissions in the past.  She was started on Celexa 2 years ago by her PMD.  She was also started on Cymbalta for neurological pain by her neurologist.  Denies any suicide attempts.  Reports previous trials of Paxil, Celexa, Cymbalta.  Past Medical History:  Past Medical History:  Diagnosis Date  . Acid reflux   . Anxiety   . Depression   . Hypertension   . Insomnia   .  Morbid obesity ([place])   . Muscle spasm   . Nocturia   . Numbness in feet   . Weakness     Past Surgical History:  Procedure Laterality Date  . CESAREAN SECTION      Family Psychiatric History:Brother-Drug abuse, anxiety disorder, depression.  Mother-depression.  Father-Alzheimer's disease.  Family History:  Family History  Problem Relation Age of Onset  . Hypertension Mother   . Depression Mother   . Alzheimer's disease Father   . Diabetes Father   . Hypertension Father   . Drug abuse Brother   . Anxiety disorder Brother   . Depression Brother   Substance  abuse history: Denies   Social History: She lives in [place] with her boyfriend.  She is on SSD.  She reports she was raised mostly by her mother.  Her dad was away in [place] until she was 18 years or so.  After he came back from [place] her parents got divorced.  Her father recently passed away from Alzheimer's dementia-October 2017.  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  She has a 56 year old son.  She does not have a good relationship with him.  Her son was mostly raised by her mother since she and her son's father, her ex-husband had divorced when he was 42 years or so.  Patient was dating someone whom her family did not approve and her son did not like at that time. Social History   Socioeconomic History  . Marital status: Divorced    Spouse name: None  . Number of children: 1  . Years of education: HS  . Highest education level: High school graduate  Social Needs  . Financial resource strain: Not hard at all  . Food insecurity - worry: Never true  . Food insecurity - inability: Never true  . Transportation needs - medical: No  . Transportation needs - non-medical: No  Occupational History  . Occupation: Collects eggs    Comment: not employed  Tobacco Use  . Smoking status: Never Smoker  . Smokeless tobacco: Never Used  Substance and Sexual Activity  . Alcohol use: No  . Drug use: No  . Sexual activity: Yes    Birth control/protection: None  Other Topics Concern  . None  Social History Narrative   Lives at home with her son.   Right-handed.   8 cups tea and 1 can of [facility] per day.    Allergies: No Known Allergies  Metabolic Disorder Labs: Lab Results  Component Value Date   HGBA1C 5.3 12/30/2017   MPG 105 12/30/2017   MPG 120 05/21/2017   No results found for: PROLACTIN Lab Results  Component Value Date   CHOL 212 (H) 05/21/2017   TRIG 146 05/21/2017   HDL 48 (L) 05/21/2017   CHOLHDL 4.4 05/21/2017   VLDL 29 05/21/2017   LDLCALC 135  (H) 05/21/2017   Lab Results  Component Value Date   TSH 4.060 02/17/2017    Therapeutic Level Labs: No results found for: LITHIUM No results found for: VALPROATE No components found for:  CBMZ  Current Medications: Current Outpatient Medications  Medication Sig Dispense Refill  . amoxicillin (AMOXIL) 875 MG tablet Take 1 tablet (875 mg total) by mouth 2 (two) times daily. 20 tablet 0  . buPROPion (WELLBUTRIN XL) 150 MG 24 hr tablet TAKE 1 TABLET BY MOUTH ONCE A DAY 30 tablet 1  . celecoxib (CELEBREX) 100 MG capsule Take 1 capsule (100 mg total)  by mouth 2 (two) times daily. 60 capsule 3  . clonazePAM (KLONOPIN) 0.5 MG tablet TAKE 1 TABLET BY MOUTH TWICE DAILY AS NEEDED FOR ANXIETY 45 tablet 1  . DULoxetine (CYMBALTA) 30 MG capsule Take 1 capsule (30 mg total) by mouth daily. Total of  90mg  daily 30 capsule 6  . DULoxetine (CYMBALTA) 60 MG capsule Take 1 capsule (60 mg total) by mouth daily. 30 capsule 11  . ferrous sulfate 325 (65 FE) MG tablet Take 1 tablet (325 mg total) 2 (two) times daily with a meal by mouth.    . hydrochlorothiazide (HYDRODIURIL) 25 MG tablet TAKE 1 TABLET BY MOUTH ONCE A DAY 30 tablet 6  . linaclotide (LINZESS) 290 MCG CAPS capsule Take 1 capsule (290 mcg total) by mouth daily before breakfast. 30 capsule 3  . losartan (COZAAR) 50 MG tablet Take 1 tablet (50 mg total) by mouth at bedtime. 30 tablet 6  . omeprazole (PRILOSEC) 20 MG capsule Take 20 mg by mouth at bedtime.    . polyethylene glycol powder (GLYCOLAX/MIRALAX) powder Take 17 g by mouth 2 (two) times daily as needed. 3350 g 2  . tiZANidine (ZANAFLEX) 4 MG tablet START WITH 1/2 TABLET BY MOUTH DAILY ANDINCREASE TO 1 TABLET 3 TIMES DAILY AS TOLERATED FOR MUSCLE SPASTICITY 90 tablet 0  . topiramate (TOPAMAX) 200 MG tablet Take 1 tablet (200 mg total) by mouth 2 (two) times daily. 180 tablet 2  . traZODone (DESYREL) 50 MG tablet Take 1-2 tablets (50-100 mg total) by mouth at bedtime as needed for sleep. 60  tablet 1  . busPIRone (BUSPAR) 15 MG tablet Take 1 tablet (15 mg total) by mouth 3 (three) times daily. 90 tablet 1   No current facility-administered medications for this visit.      Musculoskeletal: Strength & Muscle Tone: within normal limits Gait & Station: normal Patient leans: N/A  Psychiatric Specialty Exam: Review of Systems  Psychiatric/Behavioral: Positive for depression. The patient is nervous/anxious.   All other systems reviewed and are negative.   Blood pressure (!) 146/93, pulse 82, temperature (!) 97.4 F (36.3 C), temperature source Oral, weight (!) 325 lb 6.4 oz (147.6 kg).Body mass index is 55.85 kg/m.  General Appearance: Casual  Eye Contact:  Fair  Speech:  Clear and Coherent  Volume:  Normal  Mood:  Anxious  Affect:  Congruent  Thought Process:  Goal Directed and Descriptions of Associations: Intact  Orientation:  Full (Time, Place, and Person)  Thought Content: Logical   Suicidal Thoughts:  No  Homicidal Thoughts:  No  Memory:  Immediate;   Fair Recent;   Fair Remote;   Fair  Judgement:  Fair  Insight:  Fair  Psychomotor Activity:  Normal  Concentration:  Concentration: Fair and Attention Span: Fair  Recall:  [employer] of Knowledge: Fair  Language: Fair  Akathisia:  No  Handed:  Right  AIMS (if indicated): na  Assets:  Communication Skills Desire for Improvement Housing Social Support  ADL's:  Intact  Cognition: WNL  Sleep:  Fair   Screenings: PHQ2-9     Office Visit from 12/02/2017 in [facility] Office Visit from 09/22/2017 in [facility] Office Visit from 08/27/2017 in [facility] from [facility]2017 [facility] Visit from 05/21/2017 in [facility]  PHQ-2 Total Score  0  0  0  0  0  PHQ-9 Total Score  No data  No data  No data  No data  2       Assessment and Plan: Windell Hummingbird is a 52 year old Caucasian female who has a  history of depression, anxiety, functional neurological symptom disorder per history, presented to the clinic today for a follow-up visit.  Patient continues to struggle with several psychosocial stressors, her own health issues, relational struggles with her son, financial issues, morbid obesity and so on.  She had multiple workups done including imaging and is currently on vitamin D replacement, folate, iron replacement which were found to be deficient.  Her neurologist also diagnosed her with functional neurological symptom disorder since her neurological symptoms could not be explained by workup or imaging.  She continues to struggle with neurological symptoms and currently also complains of bowel and bladder incontinence.  Discussed with her to follow-up with PMD for further management of the same.  She will start IOP program at [place] [place] [facility] next week.  She will continue CBT with Ms. Peacock.  Continue plan as noted below.  Plan Depression Continue Cymbalta 90 mg p.o. daily.  Initiated by her neurologist.  She reports it helps with her neurological pain. Continue Wellbutrin XL 150 mg p.o. daily Continue trazodone 50-100 mg p.o. nightly as needed for sleep.  For anxiety symptoms Increase BuSpar to 15 mg p.o. 3 times daily. Discussed with her to taper herself off of the Klonopin gradually.  She has been limiting the use.  She uses it only for extreme social anxiety symptoms. Discussed risk of being on benzodiazepine therapy for long-term.  She will continue to pursue psychotherapy with Ms. Peacock.  History of functional neurological symptom disorder Reviewed medical records.  Her neurologist already diagnosed her with the same per report however could not find the specific diagnosis documented in her EHR.  Patient had multiple imaging done including CT and MRI scans of her brain and spinal cord which all came back within normal limits.  She does have pending EEG. Medication education as  well as discussed functional neurological symptom disorder. Patient will start intensive outpatient program next week.  Discussed with her the importance of multidisciplinary approach for her diagnosis.  She will continue to follow-up with PMD for history of vitamin D deficiency, iron deficiency, folate deficiency, morbid obesity, chronic pain as well as neurological symptoms due to bulging disc.  Follow-up in clinic in 1 month or sooner if needed.  More than 50 % of the time was spent for psychoeducation and supportive psychotherapy and care coordination.  This note was generated in part or whole with voice recognition software. Voice recognition is usually quite accurate but there are transcription errors that can and very often do occur. I apologize for any typographical errors that were not detected and corrected.      Ursula Alert, MD 02/11/2018, 11:15 AM

## 2018-02-10 NOTE — Patient Instructions (Signed)
Conversion Disorder Conversion disorder is also known as functional neurological symptom disorder. People with this mental disorder have symptoms that suggest a brain or nervous system condition, such as a stroke or seizure. However, no such condition can be found to explain the symptoms. For people with conversion disorder, the symptoms may result in:  Severe distress or anxiety.  Disruption of relationships or other normal life activities.  Medical evaluation. This often occurs in the primary care or emergency room setting.  Conversion disorder may begin anytime from late childhood through the young adult years. This disorder is more common in females than males. What are the causes? The exact cause of this disorder is unknown. It is often triggered by stressful life events involving personal relationships. What are the signs or symptoms? Signs and symptoms of conversion disorder may include:  Muscle weakness or paralysis. If this involves the bladder muscle, it can cause difficulty urinating.  Seizures or attacks of being unresponsive.  Abnormal movement, such as tremors, jerks, muscle spasms, loss of balance, or difficulty walking.  Difficulty swallowing or a feeling of having a lump in the throat.  Loss of speech (aphonia) or slurring of words (dysarthria).  Loss of the sensation of touch or pain.  Changes in vision, such as blindness or double vision.  Seeing things that are not there (hallucinations).  Changes in hearing, such as deafness.  Symptoms usually occur suddenly but may increase gradually over time. They usually go away completely in a short time. Seizures and tremors are more likely than other conversion symptoms to come back or continue. How is this diagnosed? Diagnosis of this disorder starts with an evaluation by your health care provider. Exams and tests will be done to rule out serious physical health problems. The evaluation will vary depending on your  specific symptoms. It may include:  A physical exam, including a neurological exam.  Lab tests on blood or urine samples.  X-rays or other imaging studies.  An electroencephalogram (EEG) to monitor brain waves for seizure activity.  Your health care provider may refer you to a mental health specialist for psychological evaluation. How is this treated? The main goal of treatment is to get the symptoms to go away as quickly as possible. Most people with this disorder respond well to relaxation techniques and reassurance from their health care provider that the symptoms are stress related. For people with symptoms that do not go away, the following treatment options are available:  Counseling or talk therapy. Talk therapy is provided by mental health specialists. It involves discussing stressful life events that may have triggered the symptoms and ways to cope with these issues.  Hypnotherapy. This is the use of hypnosis to help a person overcome conversion symptoms. It is provided by mental health specialists.  Amobarbital interview. This involves discussing stressful life events that may have triggered the symptoms. The mental health specialist asks you questions after you take a short-acting medicine (amobarbital) that produces a hypnotic state.  Medicine. Antidepressant medicine may be helpful even if a person with conversion symptoms is not depressed.  Physical therapy. Physical therapy can help maintain muscle strength in cases of long-term paralysis.  Follow these instructions at home:  Take medicines only as directed by your health care provider.  Try to reduce stress.  Keep all follow-up visits as directed by your health care provider. This is important. Contact a health care provider if:  Your symptoms do not go away or they become severe.  You develop  new symptoms. Get help right away if: You have serious thoughts about hurting yourself or someone else. This information  is not intended to replace advice given to you by your health care provider. Make sure you discuss any questions you have with your health care provider. Document Released: 12/14/2010 Document Revised: 04/18/2016 Document Reviewed: 03/24/2014 Elsevier Interactive Patient Education  2018 [employer].

## 2018-02-11 ENCOUNTER — Other Ambulatory Visit: Payer: Self-pay | Admitting: Neurology

## 2018-02-11 ENCOUNTER — Encounter: Payer: Self-pay | Admitting: Psychiatry

## 2018-02-11 ENCOUNTER — Other Ambulatory Visit: Payer: Self-pay | Admitting: Family Medicine

## 2018-02-11 NOTE — Telephone Encounter (Signed)
Ok to refill 

## 2018-02-17 ENCOUNTER — Ambulatory Visit: Payer: BLUE CROSS/BLUE SHIELD | Admitting: Licensed Clinical Social Worker

## 2018-03-09 ENCOUNTER — Other Ambulatory Visit: Payer: Self-pay | Admitting: Family Medicine

## 2018-03-09 NOTE — Telephone Encounter (Signed)
Ok to refill??  Last office visit 12/30/2017.  Last refill 12/05/2017.

## 2018-03-10 ENCOUNTER — Ambulatory Visit: Payer: BLUE CROSS/BLUE SHIELD | Admitting: Psychiatry

## 2018-03-19 ENCOUNTER — Ambulatory Visit: Payer: BLUE CROSS/BLUE SHIELD | Admitting: Internal Medicine

## 2018-03-19 ENCOUNTER — Encounter: Payer: Self-pay | Admitting: Psychiatry

## 2018-03-19 ENCOUNTER — Other Ambulatory Visit: Payer: Self-pay | Admitting: Psychiatry

## 2018-03-19 ENCOUNTER — Encounter: Payer: Self-pay | Admitting: Internal Medicine

## 2018-03-19 ENCOUNTER — Other Ambulatory Visit: Payer: Self-pay | Admitting: Family Medicine

## 2018-03-19 ENCOUNTER — Ambulatory Visit: Payer: BLUE CROSS/BLUE SHIELD | Admitting: Psychiatry

## 2018-03-19 ENCOUNTER — Other Ambulatory Visit: Payer: Self-pay | Admitting: Neurology

## 2018-03-19 ENCOUNTER — Other Ambulatory Visit: Payer: Self-pay

## 2018-03-19 VITALS — BP 134/76 | HR 78 | Temp 97.7°F | Wt 333.6 lb

## 2018-03-19 VITALS — BP 128/78 | HR 77 | Temp 98.5°F | Ht 64.0 in | Wt 334.8 lb

## 2018-03-19 DIAGNOSIS — N83202 Unspecified ovarian cyst, left side: Secondary | ICD-10-CM

## 2018-03-19 DIAGNOSIS — D509 Iron deficiency anemia, unspecified: Secondary | ICD-10-CM

## 2018-03-19 DIAGNOSIS — D219 Benign neoplasm of connective and other soft tissue, unspecified: Secondary | ICD-10-CM

## 2018-03-19 DIAGNOSIS — Q613 Polycystic kidney, unspecified: Secondary | ICD-10-CM | POA: Insufficient documentation

## 2018-03-19 DIAGNOSIS — G8929 Other chronic pain: Secondary | ICD-10-CM

## 2018-03-19 DIAGNOSIS — I10 Essential (primary) hypertension: Secondary | ICD-10-CM

## 2018-03-19 DIAGNOSIS — E559 Vitamin D deficiency, unspecified: Secondary | ICD-10-CM | POA: Diagnosis not present

## 2018-03-19 DIAGNOSIS — G629 Polyneuropathy, unspecified: Secondary | ICD-10-CM | POA: Diagnosis not present

## 2018-03-19 DIAGNOSIS — N3281 Overactive bladder: Secondary | ICD-10-CM

## 2018-03-19 DIAGNOSIS — K5909 Other constipation: Secondary | ICD-10-CM

## 2018-03-19 DIAGNOSIS — Z8739 Personal history of other diseases of the musculoskeletal system and connective tissue: Secondary | ICD-10-CM

## 2018-03-19 DIAGNOSIS — N83201 Unspecified ovarian cyst, right side: Secondary | ICD-10-CM

## 2018-03-19 DIAGNOSIS — F331 Major depressive disorder, recurrent, moderate: Secondary | ICD-10-CM

## 2018-03-19 DIAGNOSIS — M5416 Radiculopathy, lumbar region: Secondary | ICD-10-CM

## 2018-03-19 DIAGNOSIS — R438 Other disturbances of smell and taste: Secondary | ICD-10-CM

## 2018-03-19 DIAGNOSIS — K219 Gastro-esophageal reflux disease without esophagitis: Secondary | ICD-10-CM | POA: Insufficient documentation

## 2018-03-19 DIAGNOSIS — Z1329 Encounter for screening for other suspected endocrine disorder: Secondary | ICD-10-CM

## 2018-03-19 DIAGNOSIS — Z1231 Encounter for screening mammogram for malignant neoplasm of breast: Secondary | ICD-10-CM

## 2018-03-19 DIAGNOSIS — M4804 Spinal stenosis, thoracic region: Secondary | ICD-10-CM | POA: Diagnosis not present

## 2018-03-19 DIAGNOSIS — M5412 Radiculopathy, cervical region: Secondary | ICD-10-CM | POA: Insufficient documentation

## 2018-03-19 DIAGNOSIS — D649 Anemia, unspecified: Secondary | ICD-10-CM

## 2018-03-19 DIAGNOSIS — R2232 Localized swelling, mass and lump, left upper limb: Secondary | ICD-10-CM

## 2018-03-19 DIAGNOSIS — Z13220 Encounter for screening for lipoid disorders: Secondary | ICD-10-CM

## 2018-03-19 DIAGNOSIS — Z113 Encounter for screening for infections with a predominantly sexual mode of transmission: Secondary | ICD-10-CM

## 2018-03-19 DIAGNOSIS — R269 Unspecified abnormalities of gait and mobility: Secondary | ICD-10-CM

## 2018-03-19 DIAGNOSIS — F411 Generalized anxiety disorder: Secondary | ICD-10-CM | POA: Diagnosis not present

## 2018-03-19 DIAGNOSIS — N281 Cyst of kidney, acquired: Secondary | ICD-10-CM

## 2018-03-19 DIAGNOSIS — Z1159 Encounter for screening for other viral diseases: Secondary | ICD-10-CM

## 2018-03-19 DIAGNOSIS — K760 Fatty (change of) liver, not elsewhere classified: Secondary | ICD-10-CM | POA: Diagnosis not present

## 2018-03-19 DIAGNOSIS — L609 Nail disorder, unspecified: Secondary | ICD-10-CM | POA: Insufficient documentation

## 2018-03-19 DIAGNOSIS — R159 Full incontinence of feces: Secondary | ICD-10-CM | POA: Diagnosis not present

## 2018-03-19 DIAGNOSIS — E538 Deficiency of other specified B group vitamins: Secondary | ICD-10-CM

## 2018-03-19 DIAGNOSIS — G894 Chronic pain syndrome: Secondary | ICD-10-CM

## 2018-03-19 DIAGNOSIS — R7989 Other specified abnormal findings of blood chemistry: Secondary | ICD-10-CM

## 2018-03-19 DIAGNOSIS — F410 Panic disorder [episodic paroxysmal anxiety] without agoraphobia: Secondary | ICD-10-CM

## 2018-03-19 HISTORY — DX: Nail disorder, unspecified: L60.9

## 2018-03-19 HISTORY — DX: Personal history of other diseases of the musculoskeletal system and connective tissue: Z87.39

## 2018-03-19 HISTORY — DX: Other chronic pain: G89.29

## 2018-03-19 HISTORY — DX: Unspecified ovarian cyst, left side: N83.201

## 2018-03-19 HISTORY — DX: Benign neoplasm of connective and other soft tissue, unspecified: D21.9

## 2018-03-19 MED ORDER — CHOLECALCIFEROL 1.25 MG (50000 UT) PO CAPS: 50000.0000 [IU] | ORAL_CAPSULE | ORAL | 1 refills | Status: DC

## 2018-03-19 MED ORDER — BUSPIRONE HCL 15 MG PO TABS: 15.0000 mg | ORAL_TABLET | Freq: Three times a day (TID) | ORAL | 1 refills | Status: DC

## 2018-03-19 MED ORDER — LAMOTRIGINE 25 MG PO TABS: 25.0000 mg | ORAL_TABLET | Freq: Every day | ORAL | 1 refills | Status: DC

## 2018-03-19 MED ORDER — BUPROPION HCL ER (XL) 150 MG PO TB24: 150.0000 mg | ORAL_TABLET | Freq: Every day | ORAL | 1 refills | Status: DC

## 2018-03-19 MED ORDER — TRAZODONE HCL 50 MG PO TABS: 50.0000 mg | ORAL_TABLET | Freq: Every evening | ORAL | 1 refills | Status: DC | PRN

## 2018-03-19 NOTE — Patient Instructions (Addendum)
Please take weekly 50K IU vitamin D3  Schedule fasting labs  Schedule mammogram  We referred to GI in [facility]  Call and schedule dermatology appt disc nails with them as well   Sciatica Sciatica is pain, numbness, weakness, or tingling along the path of the sciatic nerve. The sciatic nerve starts in the lower back and runs down the back of each leg. The nerve controls the muscles in the lower leg and in the back of the knee. It also provides feeling (sensation) to the back of the thigh, the lower leg, and the sole of the foot. Sciatica is a symptom of another medical condition that pinches or puts pressure on the sciatic nerve. Generally, sciatica only affects one side of the body. Sciatica usually goes away on its own or with treatment. In some cases, sciatica may keep coming back (recur). What are the causes? This condition is caused by pressure on the sciatic nerve, or pinching of the sciatic nerve. This may be the result of:  A disk in between the bones of the spine (vertebrae) bulging out too far (herniated disk).  Age-related changes in the spinal disks (degenerative disk disease).  A pain disorder that affects a muscle in the buttock (piriformis syndrome).  Extra bone growth (bone spur) near the sciatic nerve.  An injury or break (fracture) of the pelvis.  Pregnancy.  Tumor (rare).  What increases the risk? The following factors may make you more likely to develop this condition:  Playing sports that place pressure or stress on the spine, such as football or weight lifting.  Having poor strength and flexibility.  A history of back injury.  A history of back surgery.  Sitting for long periods of time.  Doing activities that involve repetitive bending or lifting.  Obesity.  What are the signs or symptoms? Symptoms can vary from mild to very severe, and they may include:  Any of these problems in the lower back, leg, hip, or buttock: ? Mild tingling or dull  aches. ? Burning sensations. ? Sharp pains.  Numbness in the back of the calf or the sole of the foot.  Leg weakness.  Severe back pain that makes movement difficult.  These symptoms may get worse when you cough, sneeze, or laugh, or when you sit or stand for long periods of time. Being overweight may also make symptoms worse. In some cases, symptoms may recur over time. How is this diagnosed? This condition may be diagnosed based on:  Your symptoms.  A physical exam. Your health care provider may ask you to do certain movements to check whether those movements trigger your symptoms.  You may have tests, including: ? Blood tests. ? X-rays. ? MRI. ? CT scan.  How is this treated? In many cases, this condition improves on its own, without any treatment. However, treatment may include:  Reducing or modifying physical activity during periods of pain.  Exercising and stretching to strengthen your abdomen and improve the flexibility of your spine.  Icing and applying heat to the affected area.  Medicines that help: ? To relieve pain and swelling. ? To relax your muscles.  Injections of medicines that help to relieve pain, irritation, and inflammation around the sciatic nerve (steroids).  Surgery.  Follow these instructions at home: Medicines  Take over-the-counter and prescription medicines only as told by your health care provider.  Do not drive or operate heavy machinery while taking prescription pain medicine. Managing pain  If directed, apply ice to  the affected area. ? Put ice in a plastic bag. ? Place a towel between your skin and the bag. ? Leave the ice on for 20 minutes, 2-3 times a day.  After icing, apply heat to the affected area before you exercise or as often as told by your health care provider. Use the heat source that your health care provider recommends, such as a moist heat pack or a heating pad. ? Place a towel between your skin and the heat  source. ? Leave the heat on for 20-30 minutes. ? Remove the heat if your skin turns bright red. This is especially important if you are unable to feel pain, heat, or cold. You may have a greater risk of getting burned. Activity  Return to your normal activities as told by your health care provider. Ask your health care provider what activities are safe for you. ? Avoid activities that make your symptoms worse.  Take brief periods of rest throughout the day. Resting in a lying or standing position is usually better than sitting to rest. ? When you rest for longer periods, mix in some mild activity or stretching between periods of rest. This will help to prevent stiffness and pain. ? Avoid sitting for long periods of time without moving. Get up and move around at least one time each hour.  Exercise and stretch regularly, as told by your health care provider.  Do not lift anything that is heavier than 10 lb (4.5 kg) while you have symptoms of sciatica. When you do not have symptoms, you should still avoid heavy lifting, especially repetitive heavy lifting.  When you lift objects, always use proper lifting technique, which includes: ? Bending your knees. ? Keeping the load close to your body. ? Avoiding twisting. General instructions  Use good posture. ? Avoid leaning forward while sitting. ? Avoid hunching over while standing.  Maintain a healthy weight. Excess weight puts extra stress on your back and makes it difficult to maintain good posture.  Wear supportive, comfortable shoes. Avoid wearing high heels.  Avoid sleeping on a mattress that is too soft or too hard. A mattress that is firm enough to support your back when you sleep may help to reduce your pain.  Keep all follow-up visits as told by your health care provider. This is important. Contact a health care provider if:  You have pain that wakes you up when you are sleeping.  You have pain that gets worse when you lie  down.  Your pain is worse than you have experienced in the past.  Your pain lasts longer than 4 weeks.  You experience unexplained weight loss. Get help right away if:  You lose control of your bowel or bladder (incontinence).  You have: ? Weakness in your lower back, pelvis, buttocks, or legs that gets worse. ? Redness or swelling of your back. ? A burning sensation when you urinate. This information is not intended to replace advice given to you by your health care provider. Make sure you discuss any questions you have with your health care provider. Document Released: 11/05/2001 Document Revised: 04/16/2016 Document Reviewed: 07/21/2015 Elsevier Interactive Patient Education  [employer].

## 2018-03-19 NOTE — Progress Notes (Signed)
[name] Progress Note  03/19/2018 12:11 PM Erika Hanson  MRN:  [id number]  Chief Complaint: ' I am here for follow up." Chief Complaint    Follow-up; Medication Refill     HPI: Erika Hanson is a 52 year old Caucasian female, divorced, on SSD, lives in [place], has a history of depression, anxiety disorder, somatoform disorder, functional neurological symptom disorder, morbid obesity, vitamin D deficiency, iron deficiency, folate deficiency, chronic pain, presented to the clinic today for a follow-up visit.  Patient today reports that her anxiety symptoms are better.  She has not noticed significant panic symptoms.  She reports the current medications is helping.  She reports she has not been using Klonopin very frequently.  She needs it a few times a month.    She reports she however struggles with some mood swings.  She reports there are times when she is angry and other times when she cries easily.  She reports she is very defensive  about her brother who used to be a drug addict.  She reports she gets angry if anyone talks about him.   She reports she could not go for the IOP due to financial and transportation problems.  She reports she would like to go once she can figure out a way to get there and also to pay for the program.  Until then she would like to continue psychotherapy with Ms. Peacock here in clinic.  Pt continues to have neurological symptoms like phantom smells, loss of bowel and bladder function, tingling sensation on her hands and lower extremities and so on on and off.    She reports she continues to follow-up with neurology as well as his going to see a new primary medical doctor this afternoon.  Visit Diagnosis: R/O Functional neurological symptom disorder   ICD-10-CM   1. GAD (generalized anxiety disorder) F41.1 buPROPion (WELLBUTRIN XL) 150 MG 24 hr tablet    busPIRone (BUSPAR) 15 MG tablet  2. MDD (major depressive disorder), recurrent episode, moderate (HCC) F33.1  buPROPion (WELLBUTRIN XL) 150 MG 24 hr tablet    busPIRone (BUSPAR) 15 MG tablet    traZODone (DESYREL) 50 MG tablet  3. Panic attacks F41.0 busPIRone (BUSPAR) 15 MG tablet    Past Psychiatric History: Have reviewed past psychiatric history from my progress note on 02/10/2018.  Past Medical History:  Past Medical History:  Diagnosis Date  . Acid reflux   . Anxiety   . Depression   . Fatty liver   . H/O Bell's palsy    right in 2003   . Hypertension   . Insomnia   . Morbid obesity ([place])   . Muscle spasm   . Nocturia   . Numbness in feet   . Weakness     Past Surgical History:  Procedure Laterality Date  . CESAREAN SECTION      Family Psychiatric History: I have reviewed family psychiatric history from my progress note on 02/10/2018.  Family History:  Family History  Problem Relation Age of Onset  . Hypertension Mother   . Depression Mother   . Alzheimer's disease Father   . Diabetes Father   . Hypertension Father   . Drug abuse Brother   . Anxiety disorder Brother   . Depression Brother   Substance abuse history: Denies   Social History: She lives in [place] with her boyfriend.  She is on SSD.  Her father recently passed away from Alzheimer's dementia in October 2017.  She has a 41 year old son.  She does not have a good relationship with him.  Reviewed social history from my progress note on 02/10/2018. Social History   Socioeconomic History  . Marital status: Divorced    Spouse name: Not on file  . Number of children: 1  . Years of education: HS  . Highest education level: High school graduate  Occupational History  . 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 Financial resource strain: Not hard at all  . Food insecurity:    Worry: Never true    Inability: Never true  . Transportation needs:    Medical: No    Non-medical: No  Tobacco Use  . Smoking status: Never Smoker  . Smokeless tobacco: Never Used  Substance and Sexual  Activity  . Alcohol use: No  . Drug use: No  . Sexual activity: Yes    Birth control/protection: None  Lifestyle  . Physical activity:    Days per week: 0 days    Minutes per session: 0 min  . Stress: Very much  Relationships  . Social connections:    Talks on phone: Three times a week    Gets together: Three times a week    Attends religious service: Never    Active member of club or organization: No    Attends meetings of clubs or organizations: Never    Relationship status: Divorced  Other Topics Concern  . Not on file  Social History Narrative   Lives at home with her son.   Right-handed.   8 cups tea and 1 can of [facility] per day.   As of 03/19/18 pt has disability pending and not working     Allergies: No Known Allergies  Metabolic Disorder Labs: Lab Results  Component Value Date   HGBA1C 5.3 12/30/2017   MPG 105 12/30/2017   MPG 120 05/21/2017   No results found for: PROLACTIN Lab Results  Component Value Date   CHOL 212 (H) 05/21/2017   TRIG 146 05/21/2017   HDL 48 (L) 05/21/2017   CHOLHDL 4.4 05/21/2017   VLDL 29 05/21/2017   LDLCALC 135 (H) 05/21/2017   Lab Results  Component Value Date   TSH 4.060 02/17/2017    Therapeutic Level Labs: No results found for: LITHIUM No results found for: VALPROATE No components found for:  CBMZ  Current Medications: Current Outpatient Medications  Medication Sig Dispense Refill  . buPROPion (WELLBUTRIN XL) 150 MG 24 hr tablet Take 1 tablet (150 mg total) by mouth daily. 90 tablet 1  . busPIRone (BUSPAR) 15 MG tablet Take 1 tablet (15 mg total) by mouth 3 (three) times daily. 270 tablet 1  . celecoxib (CELEBREX) 100 MG capsule Take 1 capsule (100 mg total) by mouth 2 (two) times daily. 60 capsule 3  . clonazePAM (KLONOPIN) 0.5 MG tablet TAKE 1 TABLET BY MOUTH TWICE DAILY AS NEEDED FOR ANXIETY 45 tablet 1  . DULoxetine (CYMBALTA) 30 MG capsule Take 1 capsule (30 mg total) by mouth daily. Total of  90mg  daily 30  capsule 6  . DULoxetine (CYMBALTA) 60 MG capsule Take 1 capsule (60 mg total) by mouth daily. 30 capsule 11  . ferrous sulfate 325 (65 FE) MG tablet Take 1 tablet (325 mg total) 2 (two) times daily with a meal by mouth.    . hydrochlorothiazide (HYDRODIURIL) 25 MG tablet TAKE 1 TABLET BY MOUTH ONCE A DAY 30 tablet 6  . linaclotide (LINZESS) 290 MCG CAPS capsule Take 1  capsule (290 mcg total) by mouth daily before breakfast. 30 capsule 3  . losartan (COZAAR) 50 MG tablet TAKE 1 TABLET BY MOUTH ONCE A DAY FOR HIGH BLOOD PRESSURE. 60 tablet 6  . omeprazole (PRILOSEC) 20 MG capsule Take 20 mg by mouth at bedtime.    . topiramate (TOPAMAX) 200 MG tablet Take 1 tablet (200 mg total) by mouth 2 (two) times daily. 180 tablet 2  . traZODone (DESYREL) 50 MG tablet Take 1-2 tablets (50-100 mg total) by mouth at bedtime as needed. for sleep 180 tablet 1  . Cholecalciferol 50000 units capsule Take 1 capsule (50,000 Units total) by mouth once a week. 13 capsule 1  . lamoTRIgine (LAMICTAL) 25 MG tablet Take 1-2 tablets (25-50 mg total) by mouth daily. Take 25 mg for 14 days and then start taking 2 tablets ( 50 mg) 60 tablet 1  . tiZANidine (ZANAFLEX) 4 MG tablet START WITH 1/2 TABLET BY MOUTH (2 MG) DAILY AND INCREASE TO 1 TABLET (4 MG) THREE TIMES DAILY AS TOLERATED FOR MUSCLE SPASTICITY. 90 tablet 0   No current facility-administered medications for this visit.      Musculoskeletal: Strength & Muscle Tone: within normal limits Gait & Station: normal Patient leans: N/A  Psychiatric Specialty Exam: Review of Systems  Psychiatric/Behavioral: The patient is nervous/anxious.   All other systems reviewed and are negative.   Blood pressure 134/76, pulse 78, temperature 97.7 F (36.5 C), temperature source Oral, weight (!) 333 lb 9.6 oz (151.3 kg).Body mass index is 57.26 kg/m.  General Appearance: Casual  Eye Contact:  Fair  Speech:  Clear and Coherent  Volume:  Normal  Mood:  Anxious  Affect:   Congruent  Thought Process:  Goal Directed and Descriptions of Associations: Intact  Orientation:  Full (Time, Place, and Person)  Thought Content: Logical   Suicidal Thoughts:  No  Homicidal Thoughts:  No  Memory:  Immediate;   Fair Recent;   Fair Remote;   Fair  Judgement:  Fair  Insight:  Fair  Psychomotor Activity:  Normal  Concentration:  Concentration: Fair and Attention Span: Fair  Recall:  [employer] of Knowledge: Fair  Language: Fair  Akathisia:  No  Handed:  Right  AIMS (if indicated): denies  Assets:  Communication Skills Desire for Improvement Housing Social Support  ADL's:  Intact  Cognition: WNL  Sleep:  Fair   Screenings: PHQ2-9     Office Visit from 03/19/2018 in [facility] Office Visit from 12/02/2017 in [facility] Office Visit from 09/22/2017 in [facility] Visit from 08/27/2017 in [facility]  PHQ-2 Total Score  0  0  0  0  0       Assessment and Plan: Windell Hummingbird is a 52 year old Caucasian female who has a history of depression, anxiety, functional neurological symptom disorder per history, presented to the clinic today for a follow-up visit.  Patient continues to struggle with neurological problems.  She has a past history of being diagnosed per neurology with functional neurological symptom disorder although this could not be found in her records.  She is currently on vitamin D replacement, folate, iron replacement which were all found to be deficient.  She is also morbidly obese and is limited physically.  Patient will continue medications as discussed below.  She reports some improvement with her symptoms of mood.  She could not go for IOP as  referred due to financial issues.  She will continue psychotherapy with Ms. Peacock in the meantime.  Plan Depression Continue Cymbalta 90 mg p.o. daily.  Initiated by her  neurologist.  She reports it helps with neurological pain as well. Continue Wellbutrin XL 150 mg p.o. Daily Start Lamictal 25-50 mg p.o. daily.  She will start 25 mg for 2 weeks and increase to 50 mg after 2 weeks. Continue trazodone 100 mg p.o. nightly as needed for sleep  For anxiety symptoms BuSpar 15 mg p.o. 3 times daily She has been tapering herself off of the Klonopin and uses it very rarely. She will continue psychotherapy with Ms. Peacock for social anxiety, panic attacks.  Hx of functional neurological symptom disorder. Reviewed medical records.  Her neurologist already diagnosed her with the same per report however could not find a specific diagnosis documented in her [facility].  Patient had multiple imaging done including CT, MRI scans of her brain, spinal cord which all came back within normal limits.  She does have pending EEG.  Medication education as well as discuss functional neurological symptom disorder with patient.  Discussed with her about multidisciplinary approach as well as the need for continued psychotherapy.  She was hence referred for IOP.  However she could not make it due to financial problems.  She will continue therapy with Ms. Peacock.  She is going to establish care with a new primary medical doctor today.  She has a history of vitamin D deficiency, iron deficiency, folate deficiency, morbid obesity, chronic pain as well as neurological symptoms due to bulging disc.  Follow-up in clinic in 1 month or sooner if needed.  More than 50 % of the time was spent for psychoeducation and supportive psychotherapy and care coordination.  This note was generated in part or whole with voice recognition software. Voice recognition is usually quite accurate but there are transcription errors that can and very often do occur. I apologize for any typographical errors that were not detected and corrected.        Ursula Alert, MD 03/20/2018, 9:34 AM

## 2018-03-19 NOTE — Progress Notes (Addendum)
Chief Complaint  Patient presents with  . New Patient (Initial Visit)   New patient with multiple complaints she has seen outside providers former PCP in [facility] also seen [facility] neuro, [facility] neurology since 2017/2018  1. Nails c/o ridges in nails to the point where nails are painful and also nails are curved she is concerned b/c her died died of complications of agent orange and his names resembled this  -she had dermatology appt with [place] [facility] referral still active but she did not go and resch appt  -she is on iron pills 325 bid, and started taking biotin   2. She denies h/o syncope but reports when she gets hot she tastes a metallic taste in her mouth   3. She c/o abnormal gait and being off balance with leg weakness at times she has had MRIs in the past last 07/14/17 w/and w/o contrast at [facility] and no etiology for sx's. They disc with pt could do EEG but declined b/c pt was told it may or may not be helpful by neurology. She c/o numbness in hands and lower legs/toes. Neurology note previously though she had R CTS.  Neurology note also states pt has multiple neuro complaints but no explanation for sxs. She has tried Gabenpentin in the past but this was stopped due to wt gain   4. Morbid Obesity she reports she is attempting to lose wt and has lost 30 lbs   5. H/o chronic constipation on LInzess 290 and has to take Miralax prn. She also c/o fecal incontinence and at times she does not realize she has to stool   6. Chronic pain neck and mid and low back, knees, hips -->see A/P for MRI results she had in 2018 she has had back injections which has not helped and f/u spine and scoliosis specialist in [place] off [address].  She does not want to do PT due to cost current. She reports she is not working and currently applying for disability. She does have bowel/bladder incontinence and leg weakness. She has had C spine Xray 05/28/16 +OA, 09/10/15 Xray left knee +DJD, and abnormal MRI C, T, L  spine 4 and 03/2017   7. H/o vit D def   8. She c/o left forearm area with swelling   9. She c/o overactive bladder which has been noted previously per chart review  10. HTN controlled on losartan 50, hctz 25 mg qd   Review of Systems  Constitutional: Positive for weight loss.  HENT: Negative for hearing loss.        +metal taste in mouth   Eyes: Negative for blurred vision.  Respiratory: Negative for shortness of breath.   Cardiovascular: Negative for chest pain.  Gastrointestinal: Positive for constipation.       +fecal incontinence   Genitourinary:       +overactive bladder   Musculoskeletal: Positive for back pain, joint pain and neck pain.       +abnormal gait/off balance   Skin:       +nail problem  Neurological: Positive for weakness. Negative for loss of consciousness.  Psychiatric/Behavioral: The patient is nervous/anxious.    Past Medical History:  Diagnosis Date  . Acid reflux   . Anxiety   . Depression   . Fatty liver   . H/O Bell's palsy    right in 2003   . Hypertension   . Insomnia   . Morbid obesity ([place])   . Muscle spasm   . Nocturia   .  Numbness in feet   . Weakness    Past Surgical History:  Procedure Laterality Date  . CESAREAN SECTION     Family History  Problem Relation Age of Onset  . Hypertension Mother   . Depression Mother   . Alzheimer's disease Father   . Diabetes Father   . Hypertension Father   . Drug abuse Brother   . Anxiety disorder Brother   . Depression Brother    Social History   Socioeconomic History  . Marital status: Divorced    Spouse name: Not on file  . Number of children: 1  . Years of education: HS  . Highest education level: High school graduate  Occupational History  . 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 Financial resource strain: Not hard at all  . Food insecurity:    Worry: Never true    Inability: Never true  . Transportation needs:    Medical: No     Non-medical: No  Tobacco Use  . Smoking status: Never Smoker  . Smokeless tobacco: Never Used  Substance and Sexual Activity  . Alcohol use: No  . Drug use: No  . Sexual activity: Yes    Birth control/protection: None  Lifestyle  . Physical activity:    Days per week: 0 days    Minutes per session: 0 min  . Stress: Very much  Relationships  . Social connections:    Talks on phone: Three times a week    Gets together: Three times a week    Attends religious service: Never    Active member of club or organization: No    Attends meetings of clubs or organizations: Never    Relationship status: Divorced  . Intimate partner violence:    Fear of current or ex partner: No    Emotionally abused: No    Physically abused: No    Forced sexual activity: No  Other Topics Concern  . Not on file  Social History Narrative   Lives at home with her son.   Right-handed.   8 cups tea and 1 can of [facility] per day.   As of 03/19/18 pt has disability pending and not working    Current Meds  Medication Sig  . buPROPion (WELLBUTRIN XL) 150 MG 24 hr tablet Take 1 tablet (150 mg total) by mouth daily.  . busPIRone (BUSPAR) 15 MG tablet Take 1 tablet (15 mg total) by mouth 3 (three) times daily.  . celecoxib (CELEBREX) 100 MG capsule Take 1 capsule (100 mg total) by mouth 2 (two) times daily.  . clonazePAM (KLONOPIN) 0.5 MG tablet TAKE 1 TABLET BY MOUTH TWICE DAILY AS NEEDED FOR ANXIETY  . DULoxetine (CYMBALTA) 30 MG capsule Take 1 capsule (30 mg total) by mouth daily. Total of  90mg  daily  . DULoxetine (CYMBALTA) 60 MG capsule Take 1 capsule (60 mg total) by mouth daily.  . ferrous sulfate 325 (65 FE) MG tablet Take 1 tablet (325 mg total) 2 (two) times daily with a meal by mouth.  . hydrochlorothiazide (HYDRODIURIL) 25 MG tablet TAKE 1 TABLET BY MOUTH ONCE A DAY  . lamoTRIgine (LAMICTAL) 25 MG tablet Take 1-2 tablets (25-50 mg total) by mouth daily. Take 25 mg for 14 days and then start taking  2 tablets ( 50 mg)  . linaclotide (LINZESS) 290 MCG CAPS capsule Take 1 capsule (290 mcg total) by mouth daily before breakfast.  . losartan (COZAAR) 50 MG tablet  TAKE 1 TABLET BY MOUTH ONCE A DAY FOR HIGH BLOOD PRESSURE.  Marland Kitchen omeprazole (PRILOSEC) 20 MG capsule Take 20 mg by mouth at bedtime.  Marland Kitchen tiZANidine (ZANAFLEX) 4 MG tablet START WITH 1/2 TABLET BY MOUTH (2 MG) DAILY AND INCREASE TO 1 TABLET (4 MG) THREE TIMES DAILY AS TOLERATED FOR MUSCLE SPASTICITY.  Marland Kitchen topiramate (TOPAMAX) 200 MG tablet Take 1 tablet (200 mg total) by mouth 2 (two) times daily.  . traZODone (DESYREL) 50 MG tablet Take 1-2 tablets (50-100 mg total) by mouth at bedtime as needed. for sleep   No Known Allergies Recent Results (from the past 2160 hour(s))  CBC with Differential/Platelet     Status: Abnormal   Collection Time: 12/30/17  4:33 PM  Result Value Ref Range   WBC 6.7 3.8 - 10.8 Thousand/uL   RBC 4.85 3.80 - 5.10 Million/uL   Hemoglobin 12.9 11.7 - 15.5 g/dL   HCT 39.1 35.0 - 45.0 %   MCV 80.6 80.0 - 100.0 fL   MCH 26.6 (L) 27.0 - 33.0 pg   MCHC 33.0 32.0 - 36.0 g/dL   RDW 17.4 (H) 11.0 - 15.0 %   Platelets 262 140 - 400 Thousand/uL   MPV 9.4 7.5 - 12.5 fL   Neutro Abs 4,489 1,500 - 7,800 cells/uL   Lymphs Abs 1,434 850 - 3,900 cells/uL   WBC mixed population 516 200 - 950 cells/uL   Eosinophils Absolute 221 15 - 500 cells/uL   Basophils Absolute 40 0 - 200 cells/uL   Neutrophils Relative % 67 %   Total Lymphocyte 21.4 %   Monocytes Relative 7.7 %   Eosinophils Relative 3.3 %   Basophils Relative 0.6 %  Comprehensive metabolic panel     Status: Abnormal   Collection Time: 12/30/17  4:33 PM  Result Value Ref Range   Glucose, Bld 78 65 - 99 mg/dL    Comment: .            Fasting reference interval .    BUN 24 7 - 25 mg/dL   Creat 1.17 (H) 0.50 - 1.10 mg/dL   BUN/Creatinine Ratio 21 6 - 22 (calc)   Sodium 141 135 - 146 mmol/L   Potassium 4.0 3.5 - 5.3 mmol/L   Chloride 107 98 - 110 mmol/L   CO2  24 20 - 32 mmol/L   Calcium 9.4 8.6 - 10.2 mg/dL   Total Protein 6.3 6.1 - 8.1 g/dL   Albumin 3.8 3.6 - 5.1 g/dL   Globulin 2.5 1.9 - 3.7 g/dL (calc)   AG Ratio 1.5 1.0 - 2.5 (calc)   Total Bilirubin 0.2 0.2 - 1.2 mg/dL   Alkaline phosphatase (APISO) 91 33 - 115 U/L   AST 11 10 - 35 U/L   ALT 12 6 - 29 U/L  Hemoglobin A1c     Status: None   Collection Time: 12/30/17  4:33 PM  Result Value Ref Range   Hgb A1c MFr Bld 5.3 <5.7 % of total Hgb    Comment: For the purpose of screening for the presence of diabetes: . <5.7%       Consistent with the absence of diabetes 5.7-6.4%    Consistent with increased risk for diabetes             (prediabetes) > or =6.5%  Consistent with diabetes . This assay result is consistent with a decreased risk of diabetes. . Currently, no consensus exists regarding use of hemoglobin A1c for diagnosis of diabetes in  children. . According to American Diabetes Association (ADA) guidelines, hemoglobin A1c <7.0% represents optimal control in non-pregnant diabetic patients. Different metrics may apply to specific patient populations.  Standards of Medical Care in Diabetes(ADA). .    Mean Plasma Glucose 105 (calc)   eAG (mmol/L) 5.8 (calc)  Iron, TIBC and Ferritin Panel     Status: None   Collection Time: 12/30/17  4:33 PM  Result Value Ref Range   Iron 41 40 - 190 mcg/dL   TIBC 351 250 - 450 mcg/dL (calc)   %SAT 12 11 - 50 % (calc)   Ferritin 13 10 - 232 ng/mL  Vitamin D, 25-hydroxy     Status: Abnormal   Collection Time: 12/30/17  4:33 PM  Result Value Ref Range   Vit D, 25-Hydroxy 22 (L) 30 - 100 ng/mL    Comment: Vitamin D Status         25-OH Vitamin D: . Deficiency:                    <20 ng/mL Insufficiency:             20 - 29 ng/mL Optimal:                 > or = 30 ng/mL . For 25-OH Vitamin D testing on patients on  D2-supplementation and patients for whom quantitation  of D2 and D3 fractions is required, the QuestAssureD(TM) 25-OH  VIT D, (D2,D3), LC/MS/MS is recommended: order  code [id number] (patients >31yrs). . For more information on this test, go to: http://education.questdiagnostics.com/faq/FAQ163 (This link is being provided for  informational/educational purposes only.)    Objective  Body mass index is 57.47 kg/m. Wt Readings from Last 3 Encounters:  03/19/18 (!) 334 lb 12.8 oz (151.9 kg)  02/06/18 (!) 325 lb (147.4 kg)  12/30/17 (!) 326 lb (147.9 kg)   Temp Readings from Last 3 Encounters:  03/19/18 98.5 F (36.9 C) (Oral)  02/06/18 (!) 97.5 F (36.4 C) (Oral)  12/30/17 98.3 F (36.8 C) (Oral)   BP Readings from Last 3 Encounters:  03/19/18 128/78  02/06/18 (!) 156/100  12/30/17 130/62   Pulse Readings from Last 3 Encounters:  03/19/18 77  02/06/18 66  12/30/17 70    Physical Exam  Constitutional: She is oriented to person, place, and time. Vital signs are normal. She appears well-developed and well-nourished. She is cooperative.  HENT:  Head: Normocephalic. Head is with abrasion.  Mouth/Throat: Oropharynx is clear and moist and mucous membranes are normal.  Abrasions all over face from pt picking at her skin   Eyes: Pupils are equal, round, and reactive to light. Conjunctivae are normal.  Cardiovascular: Normal rate, regular rhythm and normal heart sounds.  Pulmonary/Chest: Effort normal and breath sounds normal.  Neurological: She is alert and oriented to person, place, and time.  CN 2-12 grossly intact moving all 4 ext normal motor strength no walking device   Skin: Skin is warm, dry and intact.  Psychiatric: She has a normal mood and affect. Her speech is normal and behavior is normal. Judgment and thought content normal. Cognition and memory are normal.  Talkative   Nursing note and vitals reviewed.   Assessment   1. Nail d/o could be 2/2 iron def anemia vs other vitamin def will w/u   2. Metallic taste in mouth ? Etiology related to psych condition vs other I.e  meds/supplements  3. Abnormal gait/off balance MRI 07/14/17 w/w/o brain no etiology Neuropathy  sx's A1C indicates no DM 12/2017, check other labs as ordered. ANA neg 02/17/17 4. Morbid obesity BMI >57  5. Chronic constipation, fecal incontinence (? If related to chronic constipation vs #6 alarm sx's of back issues), and iron def anemia  6. Chronic pain (entire spine, knees, hips) with alarm sx's leg weakness, fecal and bowel incontinence   MRI C 03/10/17 mild spinal stenosis with disc protrusions flattening of ventral cord w/o myelomalacia  MRI T 03/10/17 disc protrusions no compression  MRI L 03/10/17 mild spinal stenosis moderate facet hypertrophy disc bulging   Steroid shots not helped this pt in the past  Also having radicular sx's arms and legs with alarms sx's bowel and bladder incontinence and subjective leg weakness and gait instability   OA noted C spine Xray 05/28/16   DJD 09/10/15 left knee   7. Vit D def  8. Left forearm swelling/mass ? Etiology  9. Overactive bladder ? If related to #6, she is not diabetic  10. HTN  11. HM  Plan   1.  Check extensive labs today as ordered  F/u dermatology pt to sch appt  2. Screen heavy metals  Cont f/u psych and therapy  3. Pt declines PT at this time  MRI brain neg 06/2017  Pending labs to further w/u  Seen [facility] and [facility] neurology consider 3rd opinion another neurologist in future I.e [facility] in [place] consider  4.  rec continued exercise to lose wt  Healthy diet choices   5. linzess 290, miralax prn  Refer to [facility]  6. Repeat MRI C, T, L spine to look for changes  Uncertain who spine doctor is in [place] need to get records and possibly 2nd opinion  7.  50K IU weekly  8.  [place] left forearm to w/u mass  9.  She was prev. On Ditropan XL 5 mg qd not sure why she is no longer   10.  Cont hctz 25 mg qd, losartan 50 mg qd  11.  Assess vaccines at f/u   Referred for mammogram pt to call and sch  Need to refer to OB/GYN  for pap in future  Referred for colonoscopy and possibly EGD with h/o iron def anemia  DEXA-no current indication    "I spent 45 minutes face-to face with patient with greater than 50% of time spent counseling and/or in coordination of care reviewing plan of care, chart review of notes and imaging, and ordering tests   Provider: Dr. Olivia Mackie McLean-Scocuzza-Internal Medicine  Pre visit review using our clinic review tool, if applicable. No additional management support is needed unless otherwise documented below in the visit note.

## 2018-03-19 NOTE — Telephone Encounter (Signed)
Ok to refill 

## 2018-03-20 ENCOUNTER — Other Ambulatory Visit (INDEPENDENT_AMBULATORY_CARE_PROVIDER_SITE_OTHER): Payer: BLUE CROSS/BLUE SHIELD

## 2018-03-20 DIAGNOSIS — R438 Other disturbances of smell and taste: Secondary | ICD-10-CM

## 2018-03-20 DIAGNOSIS — Z113 Encounter for screening for infections with a predominantly sexual mode of transmission: Secondary | ICD-10-CM

## 2018-03-20 DIAGNOSIS — Z13220 Encounter for screening for lipoid disorders: Secondary | ICD-10-CM

## 2018-03-20 DIAGNOSIS — D649 Anemia, unspecified: Secondary | ICD-10-CM

## 2018-03-20 DIAGNOSIS — G629 Polyneuropathy, unspecified: Secondary | ICD-10-CM | POA: Diagnosis not present

## 2018-03-20 DIAGNOSIS — K760 Fatty (change of) liver, not elsewhere classified: Secondary | ICD-10-CM

## 2018-03-20 DIAGNOSIS — R7989 Other specified abnormal findings of blood chemistry: Secondary | ICD-10-CM

## 2018-03-20 DIAGNOSIS — E538 Deficiency of other specified B group vitamins: Secondary | ICD-10-CM

## 2018-03-20 DIAGNOSIS — Z1159 Encounter for screening for other viral diseases: Secondary | ICD-10-CM

## 2018-03-20 DIAGNOSIS — L609 Nail disorder, unspecified: Secondary | ICD-10-CM

## 2018-03-20 DIAGNOSIS — D509 Iron deficiency anemia, unspecified: Secondary | ICD-10-CM

## 2018-03-20 DIAGNOSIS — Z1329 Encounter for screening for other suspected endocrine disorder: Secondary | ICD-10-CM

## 2018-03-20 NOTE — Addendum Note (Signed)
Addended by: Leeanne Rio on: 03/20/2018 02:35 PM   Modules accepted: Orders

## 2018-03-21 LAB — HIV ANTIBODY (ROUTINE TESTING W REFLEX): HIV 1&2 Ab, 4th Generation: NONREACTIVE

## 2018-03-22 LAB — HEPATITIS A ANTIBODY, IGM: HEP A IGM: NONREACTIVE

## 2018-03-22 LAB — HEPATITIS B SURFACE ANTIBODY, QUANTITATIVE: Hepatitis B-Post: <5 m[IU]/mL — ABNORMAL LOW (ref >=10)

## 2018-03-22 LAB — HEPATITIS C ANTIBODY
Hepatitis C Ab: NONREACTIVE
SIGNAL TO CUT-OFF: 0.01 (ref <1.00)

## 2018-03-22 LAB — HEPATITIS B SURFACE ANTIGEN: Hepatitis B Surface Ag: NONREACTIVE

## 2018-03-23 LAB — T4, FREE: FREE T4: 1.3 ng/dL (ref 0.8–1.8)

## 2018-03-23 LAB — TSH: TSH: 2.74 mIU/L

## 2018-03-23 LAB — LIPID PANEL
Cholesterol: 219 mg/dL — ABNORMAL HIGH (ref <200)
HDL: 69 mg/dL (ref >50)
LDL CHOLESTEROL (CALC): 128 mg/dL — AB
Non-HDL Cholesterol (Calc): 150 mg/dL (calc) — ABNORMAL HIGH (ref <130)
TRIGLYCERIDES: 108 mg/dL (ref <150)
Total CHOL/HDL Ratio: 3.2 (calc) (ref <5.0)

## 2018-03-23 LAB — HEPATITIS A ANTIBODY, TOTAL: Hepatitis A AB,Total: NONREACTIVE

## 2018-03-23 LAB — B12 AND FOLATE PANEL: VITAMIN B 12: 490 pg/mL (ref 200–1100)

## 2018-03-23 LAB — PROTEIN ELECTROPHORESIS, SERUM
ALBUMIN ELP: 3.7 g/dL — AB (ref 3.8–4.8)
Alpha 1: 0.3 g/dL (ref 0.2–0.3)
Alpha 2: 0.9 g/dL (ref 0.5–0.9)
BETA 2: 0.4 g/dL (ref 0.2–0.5)
Beta Globulin: 0.5 g/dL (ref 0.4–0.6)
Gamma Globulin: 0.8 g/dL (ref 0.8–1.7)
Total Protein: 6.6 g/dL (ref 6.1–8.1)

## 2018-03-23 LAB — TEST AUTHORIZATION

## 2018-03-23 LAB — ZINC: Zinc: 64 ug/dL (ref 60–130)

## 2018-03-23 LAB — RPR: RPR Ser Ql: NONREACTIVE

## 2018-03-23 NOTE — Addendum Note (Signed)
Addended by: Leeanne Rio on: 03/23/2018 12:34 PM   Modules accepted: Orders

## 2018-03-24 LAB — VITAMIN A: Vitamin A (Retinoic Acid): 65 ug/dL (ref 38–98)

## 2018-03-26 LAB — URINALYSIS, ROUTINE W REFLEX MICROSCOPIC
Bilirubin, UA: NEGATIVE
Glucose, UA: NEGATIVE
Ketones, UA: NEGATIVE
Nitrite, UA: NEGATIVE
Protein, UA: NEGATIVE
RBC, UA: NEGATIVE
Specific Gravity, UA: 1.011 (ref 1.005–1.030)
Urobilinogen, Ur: 0.2 mg/dL (ref 0.2–1.0)
pH, UA: 8 — ABNORMAL HIGH (ref 5.0–7.5)

## 2018-03-26 LAB — PROTEIN ELECTROPHORESIS, URINE REFLEX
Albumin ELP, Urine: 61.2 %
Alpha-1-Globulin, U: 3.1 %
Alpha-2-Globulin, U: 7.8 %
Beta Globulin, U: 13.7 %
GAMMA GLOBULIN, U: 14.2 %
Protein, Ur: 8.3 mg/dL

## 2018-03-26 LAB — MICROSCOPIC EXAMINATION: Casts: NONE SEEN /lpf

## 2018-03-27 ENCOUNTER — Telehealth: Payer: Self-pay | Admitting: Internal Medicine

## 2018-03-27 ENCOUNTER — Other Ambulatory Visit: Payer: Self-pay | Admitting: Internal Medicine

## 2018-03-27 ENCOUNTER — Encounter: Payer: Self-pay | Admitting: Internal Medicine

## 2018-03-27 ENCOUNTER — Ambulatory Visit
Admission: RE | Admit: 2018-03-27 | Discharge: 2018-03-27 | Disposition: A | Discharge status: Home / Self Care | Payer: BLUE CROSS/BLUE SHIELD | Source: Ambulatory Visit | Attending: Internal Medicine | Admitting: Internal Medicine

## 2018-03-27 DIAGNOSIS — M5412 Radiculopathy, cervical region: Secondary | ICD-10-CM | POA: Diagnosis not present

## 2018-03-27 DIAGNOSIS — M4802 Spinal stenosis, cervical region: Secondary | ICD-10-CM | POA: Diagnosis not present

## 2018-03-27 DIAGNOSIS — M8938 Hypertrophy of bone, other site: Secondary | ICD-10-CM | POA: Diagnosis not present

## 2018-03-27 DIAGNOSIS — M5124 Other intervertebral disc displacement, thoracic region: Secondary | ICD-10-CM | POA: Diagnosis not present

## 2018-03-27 DIAGNOSIS — R2232 Localized swelling, mass and lump, left upper limb: Secondary | ICD-10-CM

## 2018-03-27 DIAGNOSIS — M2578 Osteophyte, vertebrae: Secondary | ICD-10-CM | POA: Insufficient documentation

## 2018-03-27 DIAGNOSIS — M5416 Radiculopathy, lumbar region: Secondary | ICD-10-CM

## 2018-03-27 DIAGNOSIS — G8929 Other chronic pain: Secondary | ICD-10-CM

## 2018-03-27 DIAGNOSIS — M4807 Spinal stenosis, lumbosacral region: Secondary | ICD-10-CM | POA: Diagnosis not present

## 2018-03-27 DIAGNOSIS — M542 Cervicalgia: Secondary | ICD-10-CM

## 2018-03-27 DIAGNOSIS — M47896 Other spondylosis, lumbar region: Secondary | ICD-10-CM | POA: Diagnosis not present

## 2018-03-27 DIAGNOSIS — M50222 Other cervical disc displacement at C5-C6 level: Secondary | ICD-10-CM | POA: Insufficient documentation

## 2018-03-27 DIAGNOSIS — Z8739 Personal history of other diseases of the musculoskeletal system and connective tissue: Secondary | ICD-10-CM

## 2018-03-27 DIAGNOSIS — M4804 Spinal stenosis, thoracic region: Secondary | ICD-10-CM

## 2018-03-27 DIAGNOSIS — M549 Dorsalgia, unspecified: Secondary | ICD-10-CM

## 2018-03-27 LAB — HEAVY METALS PROFILE, URINE
Arsenic, 24H Ur: <10 mcg/L (ref <=80)
Lead, 24 hr urine: <10 mcg/L (ref <80)

## 2018-03-27 NOTE — Telephone Encounter (Signed)
Copied from [place] [id number]. Topic: Quick Communication - Lab Results >> Mar 27, 2018  3:39 PM Babs Bertin, CMA wrote: Called patient to inform them of [id number] lab results. When patient returns call, triage nurse may disclose all four results. >> Mar 27, 2018  3:45 PM Marin Olp L wrote: No [facility] RN free for results.

## 2018-03-30 NOTE — Telephone Encounter (Signed)
Pt given results from 4/26 per notes of Dr. Terese Door on 5/3/19Unable to document in result note due to result note not being routed to [facility].

## 2018-04-01 ENCOUNTER — Other Ambulatory Visit: Payer: Self-pay | Admitting: Internal Medicine

## 2018-04-01 DIAGNOSIS — R2232 Localized swelling, mass and lump, left upper limb: Secondary | ICD-10-CM

## 2018-04-03 ENCOUNTER — Encounter (INDEPENDENT_AMBULATORY_CARE_PROVIDER_SITE_OTHER): Payer: Self-pay

## 2018-04-06 ENCOUNTER — Other Ambulatory Visit: Payer: Self-pay | Admitting: Internal Medicine

## 2018-04-06 DIAGNOSIS — F329 Major depressive disorder, single episode, unspecified: Secondary | ICD-10-CM

## 2018-04-06 DIAGNOSIS — F32A Depression, unspecified: Secondary | ICD-10-CM

## 2018-04-06 DIAGNOSIS — F419 Anxiety disorder, unspecified: Principal | ICD-10-CM

## 2018-04-06 MED ORDER — DULOXETINE HCL 60 MG PO CPEP: ORAL_CAPSULE | ORAL | 11 refills | Status: DC

## 2018-04-06 MED ORDER — DULOXETINE HCL 30 MG PO CPEP: ORAL_CAPSULE | ORAL | 11 refills | Status: DC

## 2018-04-09 ENCOUNTER — Ambulatory Visit
Admission: RE | Admit: 2018-04-09 | Discharge: 2018-04-09 | Disposition: A | Discharge status: Home / Self Care | Payer: BLUE CROSS/BLUE SHIELD | Source: Ambulatory Visit | Attending: Internal Medicine | Admitting: Internal Medicine

## 2018-04-09 ENCOUNTER — Other Ambulatory Visit: Payer: Self-pay

## 2018-04-09 ENCOUNTER — Encounter: Payer: Self-pay | Admitting: Gastroenterology

## 2018-04-09 ENCOUNTER — Ambulatory Visit (INDEPENDENT_AMBULATORY_CARE_PROVIDER_SITE_OTHER): Payer: BLUE CROSS/BLUE SHIELD | Admitting: Gastroenterology

## 2018-04-09 VITALS — BP 115/69 | HR 66 | Resp 16 | Ht 64.0 in | Wt 334.4 lb

## 2018-04-09 DIAGNOSIS — R152 Fecal urgency: Secondary | ICD-10-CM

## 2018-04-09 DIAGNOSIS — Z1211 Encounter for screening for malignant neoplasm of colon: Secondary | ICD-10-CM

## 2018-04-09 DIAGNOSIS — R2232 Localized swelling, mass and lump, left upper limb: Secondary | ICD-10-CM | POA: Diagnosis present

## 2018-04-09 DIAGNOSIS — R159 Full incontinence of feces: Secondary | ICD-10-CM | POA: Diagnosis not present

## 2018-04-09 DIAGNOSIS — L988 Other specified disorders of the skin and subcutaneous tissue: Secondary | ICD-10-CM | POA: Insufficient documentation

## 2018-04-09 DIAGNOSIS — Z1231 Encounter for screening mammogram for malignant neoplasm of breast: Secondary | ICD-10-CM | POA: Insufficient documentation

## 2018-04-09 DIAGNOSIS — R234 Changes in skin texture: Secondary | ICD-10-CM | POA: Diagnosis not present

## 2018-04-09 DIAGNOSIS — K5904 Chronic idiopathic constipation: Secondary | ICD-10-CM

## 2018-04-09 NOTE — Patient Instructions (Signed)
High-Fiber Diet  Fiber, also called dietary fiber, is a type of carbohydrate found in fruits, vegetables, whole grains, and beans. A high-fiber diet can have many health benefits. Your health care provider may recommend a high-fiber diet to help:  · Prevent constipation. Fiber can make your bowel movements more regular.  · Lower your cholesterol.  · Relieve hemorrhoids, uncomplicated diverticulosis, or irritable bowel syndrome.  · Prevent overeating as part of a weight-loss plan.  · Prevent heart disease, type 2 diabetes, and certain cancers.    What is my plan?  The recommended daily intake of fiber includes:  · 38 grams for men under age 50.  · 30 grams for men over age 50.  · 25 grams for women under age 50.  · 21 grams for women over age 50.    You can get the recommended daily intake of dietary fiber by eating a variety of fruits, vegetables, grains, and beans. Your health care provider may also recommend a fiber supplement if it is not possible to get enough fiber through your diet.  What do I need to know about a high-fiber diet?  · Fiber supplements have not been widely studied for their effectiveness, so it is better to get fiber through food sources.  · Always check the fiber content on the nutrition facts label of any prepackaged food. Look for foods that contain at least 5 grams of fiber per serving.  · Ask your dietitian if you have questions about specific foods that are related to your condition, especially if those foods are not listed in the following section.  · Increase your daily fiber consumption gradually. Increasing your intake of dietary fiber too quickly may cause bloating, cramping, or gas.  · Drink plenty of water. Water helps you to digest fiber.  What foods can I eat?  Grains  Whole-grain breads. Multigrain cereal. Oats and oatmeal. Brown rice. Barley. Bulgur wheat. Millet. Bran muffins. Popcorn. Rye wafer crackers.  Vegetables   Sweet potatoes. Spinach. Kale. Artichokes. Cabbage. Broccoli. Green peas. Carrots. Squash.  Fruits  Berries. Pears. Apples. Oranges. Avocados. Prunes and raisins. Dried figs.  Meats and Other Protein Sources  [place]y, kidney, pinto, and soy beans. Split peas. Lentils. Nuts and seeds.  Dairy  Fiber-fortified yogurt.  Beverages  Fiber-fortified soy milk. Fiber-fortified orange juice.  Other  Fiber bars.  The items listed above may not be a complete list of recommended foods or beverages. Contact your dietitian for more options.  What foods are not recommended?  Grains  White bread. Pasta made with refined flour. White rice.  Vegetables  Fried potatoes. Canned vegetables. Well-cooked vegetables.  Fruits  Fruit juice. Cooked, strained fruit.  Meats and Other Protein Sources  Fatty cuts of meat. Fried poultry or fried fish.  Dairy  Milk. Yogurt. Cream cheese. Sour cream.  Beverages  Soft drinks.  Other  Cakes and pastries. Butter and oils.  The items listed above may not be a complete list of foods and beverages to avoid. Contact your dietitian for more information.  What are some tips for including high-fiber foods in my diet?  · Eat a wide variety of high-fiber foods.  · Make sure that half of all grains consumed each day are whole grains.  · Replace breads and cereals made from refined flour or white flour with whole-grain breads and cereals.  · Replace white rice with brown rice, bulgur wheat, or millet.  · Start the day with a breakfast that is high in fiber,   such as a cereal that contains at least 5 grams of fiber per serving.  · Use beans in place of meat in soups, salads, or pasta.  · Eat high-fiber snacks, such as berries, raw vegetables, nuts, or popcorn.  This information is not intended to replace advice given to you by your health care provider. Make sure you discuss any questions you have with your health care provider.  Document Released: 11/11/2005 Document Revised: 04/18/2016 Document Reviewed: 04/26/2014   Elsevier Interactive Patient Education © 2018 [employer]

## 2018-04-09 NOTE — Progress Notes (Signed)
Cephas Darby, MD [address]  [place]  [place], [place] [address]  Main: [phone]  Fax: [phone]    Gastroenterology Consultation  Referring Provider:     McLean-Scocuzza, Olivia Mackie * Primary Care Physician:  McLean-Scocuzza, [name], MD Primary Gastroenterologist:  Dr. Cephas Darby Reason for Consultation:     Chronic constipation        HPI:   Erika Hanson is a 52 y.o. female referred by Dr. Terese Door, [name], MD  for consultation & management of chronic constipation. She started Linzess approximately 1and1/2 yrs ago which results in one to 2 formed bowel movements daily. She denies straining. She denies rectal bleeding or blood on wiping. She is concerned about rectal urgency especially when she is outside, which results in accidents been unable to find a bathroom. She reports that she has a lot of social anxiety, other psychiatric conditions and on several medications.  She has lost about 80 pounds within last 1-2 years, following low-carb diet and she is motivated to continue to lose weight. She feels good overall with weight loss. She does have chronic back pain issues  She has history of iron deficiency anemia secondary to menorrhagia which has currently resolved. She is on oral iron replacement. Her hemoglobin is currently normal. TSH is normal. She has normal LFTs and no imaging to confirm fatty liver disease although this is mentioned in her problem list  She is also here to discuss about screening colonoscopy  NSAIDs:  None  Antiplts/Anticoagulants/Anti thrombotics: None  GI Procedures: None She denies family history of GI malignancy  Past Medical History:  Diagnosis Date  . Acid reflux   . Anxiety   . Depression   . Fatty liver   . H/O Bell's palsy    right in 2003   . Hypertension   . Insomnia   . Morbid obesity ([place])   . Muscle spasm   . Nocturia   . Numbness in feet   . Weakness     Past Surgical History:  Procedure  Laterality Date  . CESAREAN SECTION      Current Outpatient Medications:  .  buPROPion (WELLBUTRIN XL) 150 MG 24 hr tablet, Take 1 tablet (150 mg total) by mouth daily., Disp: 90 tablet, Rfl: 1 .  busPIRone (BUSPAR) 15 MG tablet, Take 1 tablet (15 mg total) by mouth 3 (three) times daily., Disp: 270 tablet, Rfl: 1 .  celecoxib (CELEBREX) 100 MG capsule, Take 1 capsule (100 mg total) by mouth 2 (two) times daily., Disp: 60 capsule, Rfl: 3 .  Cholecalciferol 50000 units capsule, Take 1 capsule (50,000 Units total) by mouth once a week., Disp: 13 capsule, Rfl: 1 .  clonazePAM (KLONOPIN) 0.5 MG tablet, TAKE 1 TABLET BY MOUTH TWICE DAILY AS NEEDED FOR ANXIETY, Disp: 45 tablet, Rfl: 1 .  DULoxetine (CYMBALTA) 30 MG capsule, Total of  90mg  daily, Disp: 30 capsule, Rfl: 11 .  DULoxetine (CYMBALTA) 60 MG capsule, Take 90 mg daily (60 mg + 30 mg), Disp: 30 capsule, Rfl: 11 .  ferrous sulfate 325 (65 FE) MG tablet, Take 1 tablet (325 mg total) 2 (two) times daily with a meal by mouth., Disp: , Rfl:  .  hydrochlorothiazide (HYDRODIURIL) 25 MG tablet, TAKE 1 TABLET BY MOUTH ONCE A DAY, Disp: 30 tablet, Rfl: 6 .  lamoTRIgine (LAMICTAL) 25 MG tablet, Take 1-2 tablets (25-50 mg total) by mouth daily. Take 25 mg for 14 days and then start taking 2 tablets (  50 mg), Disp: 60 tablet, Rfl: 1 .  linaclotide (LINZESS) 290 MCG CAPS capsule, Take 1 capsule (290 mcg total) by mouth daily before breakfast., Disp: 30 capsule, Rfl: 3 .  losartan (COZAAR) 50 MG tablet, TAKE 1 TABLET BY MOUTH ONCE A DAY FOR HIGH BLOOD PRESSURE., Disp: 60 tablet, Rfl: 6 .  omeprazole (PRILOSEC) 20 MG capsule, Take 20 mg by mouth at bedtime., Disp: , Rfl:  .  tiZANidine (ZANAFLEX) 4 MG tablet, START WITH 1/2 TABLET BY MOUTH (2 MG) DAILY AND INCREASE TO 1 TABLET (4 MG) THREE TIMES DAILY AS TOLERATED FOR MUSCLE SPASTICITY., Disp: 90 tablet, Rfl: 0 .  topiramate (TOPAMAX) 200 MG tablet, Take 1 tablet (200 mg total) by mouth 2 (two) times daily.,  Disp: 180 tablet, Rfl: 2 .  traZODone (DESYREL) 50 MG tablet, Take 1-2 tablets (50-100 mg total) by mouth at bedtime as needed. for sleep, Disp: 180 tablet, Rfl: 1   Family History  Problem Relation Age of Onset  . Hypertension Mother   . Depression Mother   . Alzheimer's disease Father   . Diabetes Father   . Hypertension Father   . Drug abuse Brother   . Anxiety disorder Brother   . Depression Brother   . Breast cancer Neg Hx      Social History   Tobacco Use  . Smoking status: Never Smoker  . Smokeless tobacco: Never Used  Substance Use Topics  . Alcohol use: No  . Drug use: No    Allergies as of 04/09/2018  . (No Known Allergies)    Review of Systems:    All systems reviewed and negative except where noted in HPI.   Physical Exam:  BP 115/69 (BP Location: Left Arm, Patient Position: Sitting, Cuff Size: Large)   Pulse 66   Resp 16   Ht 5\' 4"  (1.626 m)   Wt (!) 334 lb 6.4 oz (151.7 kg)   LMP 05/21/2016   BMI 57.40 kg/m  Patient's last menstrual period was 05/21/2016.  General:   Alert,  Well-developed, well-nourished, pleasant and cooperative in NAD Head:  Normocephalic and atraumatic. Eyes:  Sclera clear, no icterus.   Conjunctiva pink. Ears:  Normal auditory acuity. Nose:  No deformity, discharge, or lesions. Mouth:  No deformity or lesions,oropharynx pink & moist. Neck:  Supple; no masses or thyromegaly. Lungs:  Respirations even and unlabored.  Clear throughout to auscultation.   No wheezes, crackles, or rhonchi. No acute distress. Heart:  Regular rate and rhythm; no murmurs, clicks, rubs, or gallops. Abdomen:  Normal bowel sounds. Soft, severe abdominal obesity, non-tender and non-distended, limited exam  No guarding or rebound tenderness.   Rectal: Not performed Msk:  Symmetrical without gross deformities. Good, equal movement & strength bilaterally. Pulses:  Normal pulses noted. Extremities:  No clubbing or edema.  No cyanosis. Neurologic:  Alert  and oriented x3;  grossly normal neurologically. Skin:  Intact without significant lesions or rashes. No jaundice. Psych:  Alert and cooperative. Normal mood and affect.  Imaging Studies: No abdominal imaging  Assessment and Plan:   Erika Hanson is a 52 y.o. Caucasian female with morbid obesity, anxiety, depression, chronic constipation which is currently under control on linaclotide to 90 MCG daily. She has intermittent episodes of fecal urgency especially when she is outside in the setting of severe social anxiety. Recommended her to try Imodium before going out.  Discussed with her about screening colonoscopy and she is willing to undergo when she turns 52 next month  Given her information about bariatric wellness Center and services offered by [facility] Encouraged her to continue to lose weight, avoid sweet tea and red meat   Follow up in 3 months   Cephas Darby, MD

## 2018-04-13 ENCOUNTER — Telehealth: Payer: Self-pay | Admitting: Internal Medicine

## 2018-04-13 NOTE — Telephone Encounter (Signed)
Copied from [place] [id number]. Topic: General - Other >> Apr 13, 2018  1:30 PM Yvette Rack wrote: Reason for CRM: pt calling about lab results

## 2018-04-14 NOTE — Telephone Encounter (Signed)
Patient was calling about her MRI and she has since discussed that result.

## 2018-04-16 ENCOUNTER — Encounter: Payer: Self-pay | Admitting: Psychiatry

## 2018-04-16 ENCOUNTER — Other Ambulatory Visit: Payer: Self-pay

## 2018-04-16 ENCOUNTER — Ambulatory Visit (INDEPENDENT_AMBULATORY_CARE_PROVIDER_SITE_OTHER): Payer: BLUE CROSS/BLUE SHIELD | Admitting: Psychiatry

## 2018-04-16 VITALS — BP 136/79 | HR 77 | Temp 97.4°F | Wt 336.8 lb

## 2018-04-16 DIAGNOSIS — F410 Panic disorder [episodic paroxysmal anxiety] without agoraphobia: Secondary | ICD-10-CM

## 2018-04-16 DIAGNOSIS — F411 Generalized anxiety disorder: Secondary | ICD-10-CM | POA: Diagnosis not present

## 2018-04-16 DIAGNOSIS — F331 Major depressive disorder, recurrent, moderate: Secondary | ICD-10-CM | POA: Diagnosis not present

## 2018-04-16 MED ORDER — LAMOTRIGINE 25 MG PO TABS: 50.0000 mg | ORAL_TABLET | Freq: Every day | ORAL | 0 refills | Status: DC

## 2018-04-16 NOTE — Progress Notes (Signed)
[facility] MD OP Progress Note  04/16/2018 11:57 AM Erika Hanson  MRN:  [id number]  Chief Complaint: ' I am here for follow up.' Chief Complaint    Follow-up; Medication Refill     SNK:NLZJQBHAL is a 52 year old Caucasian female, divorced, on SSD, lives in [place], has a history of depression, anxiety disorder, somatoform disorder, functional neurological symptom disorder, morbid obesity, vitamin D deficiency, iron deficiency, folate deficiency, chronic pain, presented to the clinic today for a follow-up visit.  Patient today reports she is currently doing well on the current medication regimen.  She reports her anxiety and depressive symptoms are more under control.  She denies any significant side effects from her medications.  She reports the Lamictal 50 mg is helping her with her tearfulness and crying spells.  Patient denies any significant panic symptoms.  Patient reports she was able to get her evaluation done by another doctor who referred her to an orthopedic provider.  Patient reports she had several MRI scans and they were able to find out there is a problem with her neck which needs immediate surgery.  She reports she was told that the neck problems caused her tingling and numbness.  She reports she was happy they were able to get to the bottom of it.  She reports she is happy that it is not all in her head and she got some reassurance that she is actually suffering from pain and  that is actually contributing to her numbness and tingling.  Patient reports she looks forward to her surgery which is going to be next Wednesday.  Patient reports sleep is good.  Patient denies any suicidality.  Patient denies any perceptual disturbances.  Patient also reports she is ready for her IOP since she will be able to financially afford it now.   Visit Diagnosis:  R/O Functional neurological symptom do   ICD-10-CM   1. GAD (generalized anxiety disorder) F41.1   2. MDD (major depressive disorder),  recurrent episode, moderate (HCC) F33.1   3. Panic attacks F41.0     Past Psychiatric History: I have reviewed past psychiatric history from my progress note on 02/10/2018.  Past trials of Paxil, Celexa, Cymbalta.  Past Medical History:  Past Medical History:  Diagnosis Date  . Acid reflux   . Anemia, iron deficiency 09/22/2017  . Anxiety   . Depression   . Fatty liver   . Fibroid 03/19/2018  . H/O Bell's palsy    right in 2003   . Hypertension   . Insomnia   . Morbid obesity ([place])   . Muscle spasm   . Nocturia   . Numbness in feet   . Weakness     Past Surgical History:  Procedure Laterality Date  . CESAREAN SECTION      Family Psychiatric History: Reviewed family psychiatric history from my progress note on 02/10/2018.  Family History:  Family History  Problem Relation Age of Onset  . Hypertension Mother   . Depression Mother   . Alzheimer's disease Father   . Diabetes Father   . Hypertension Father   . Drug abuse Brother   . Anxiety disorder Brother   . Depression Brother   . Breast cancer Neg Hx     Social History: Reviewed social history from my progress note on 02/10/2018. Social History   Socioeconomic History  . Marital status: Divorced    Spouse name: Not on file  . Number of children: 1  . Years of education: HS  .  Highest education level: High school graduate  Occupational History  . 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 Financial resource strain: Not hard at all  . Food insecurity:    Worry: Never true    Inability: Never true  . Transportation needs:    Medical: No    Non-medical: No  Tobacco Use  . Smoking status: Never Smoker  . Smokeless tobacco: Never Used  Substance and Sexual Activity  . Alcohol use: No  . Drug use: No  . Sexual activity: Yes    Birth control/protection: None  Lifestyle  . Physical activity:    Days per week: 0 days    Minutes per session: 0 min  . Stress: Very much   Relationships  . Social connections:    Talks on phone: Three times a week    Gets together: Three times a week    Attends religious service: Never    Active member of club or organization: No    Attends meetings of clubs or organizations: Never    Relationship status: Divorced  Other Topics Concern  . Not on file  Social History Narrative   Lives at home with her son.   Right-handed.   8 cups tea and 1 can of [facility] per day.   As of 03/19/18 pt has disability pending and not working     Allergies: No Known Allergies  Metabolic Disorder Labs: Lab Results  Component Value Date   HGBA1C 5.3 12/30/2017   MPG 105 12/30/2017   MPG 120 05/21/2017   No results found for: PROLACTIN Lab Results  Component Value Date   CHOL 219 (H) 03/20/2018   TRIG 108 03/20/2018   HDL 69 03/20/2018   CHOLHDL 3.2 03/20/2018   VLDL 29 05/21/2017   LDLCALC 128 (H) 03/20/2018   LDLCALC 135 (H) 05/21/2017   Lab Results  Component Value Date   TSH 2.74 03/20/2018   TSH 4.060 02/17/2017    Therapeutic Level Labs: No results found for: LITHIUM No results found for: VALPROATE No components found for:  CBMZ  Current Medications: Current Outpatient Medications  Medication Sig Dispense Refill  . buPROPion (WELLBUTRIN XL) 150 MG 24 hr tablet Take 1 tablet (150 mg total) by mouth daily. 90 tablet 1  . busPIRone (BUSPAR) 15 MG tablet Take 1 tablet (15 mg total) by mouth 3 (three) times daily. 270 tablet 1  . celecoxib (CELEBREX) 100 MG capsule Take 1 capsule (100 mg total) by mouth 2 (two) times daily. 60 capsule 3  . Cholecalciferol 50000 units capsule Take 1 capsule (50,000 Units total) by mouth once a week. 13 capsule 1  . clonazePAM (KLONOPIN) 0.5 MG tablet TAKE 1 TABLET BY MOUTH TWICE DAILY AS NEEDED FOR ANXIETY 45 tablet 1  . DULoxetine (CYMBALTA) 30 MG capsule Total of  90mg  daily 30 capsule 11  . DULoxetine (CYMBALTA) 60 MG capsule Take 90 mg daily (60 mg + 30 mg) 30 capsule 11  .  ferrous sulfate 325 (65 FE) MG tablet Take 1 tablet (325 mg total) 2 (two) times daily with a meal by mouth.    . hydrochlorothiazide (HYDRODIURIL) 25 MG tablet TAKE 1 TABLET BY MOUTH ONCE A DAY 30 tablet 6  . lamoTRIgine (LAMICTAL) 25 MG tablet Take 2 tablets (50 mg total) by mouth daily. 180 tablet 0  . linaclotide (LINZESS) 290 MCG CAPS capsule Take 1 capsule (290 mcg total) by mouth daily before breakfast. 30 capsule 3  .  losartan (COZAAR) 50 MG tablet TAKE 1 TABLET BY MOUTH ONCE A DAY FOR HIGH BLOOD PRESSURE. 60 tablet 6  . omeprazole (PRILOSEC) 20 MG capsule Take 20 mg by mouth at bedtime.    Marland Kitchen tiZANidine (ZANAFLEX) 4 MG tablet START WITH 1/2 TABLET BY MOUTH (2 MG) DAILY AND INCREASE TO 1 TABLET (4 MG) THREE TIMES DAILY AS TOLERATED FOR MUSCLE SPASTICITY. 90 tablet 0  . topiramate (TOPAMAX) 200 MG tablet Take 1 tablet (200 mg total) by mouth 2 (two) times daily. 180 tablet 2  . traZODone (DESYREL) 50 MG tablet Take 1-2 tablets (50-100 mg total) by mouth at bedtime as needed. for sleep 180 tablet 1   No current facility-administered medications for this visit.      Musculoskeletal: Strength & Muscle Tone: within normal limits Gait & Station: normal Patient leans: N/A  Psychiatric Specialty Exam: Review of Systems  Musculoskeletal: Positive for back pain and neck pain.  Psychiatric/Behavioral: The patient is nervous/anxious.   All other systems reviewed and are negative.   Blood pressure 136/79, pulse 77, temperature (!) 97.4 F (36.3 C), temperature source Oral, weight (!) 336 lb 12.8 oz (152.8 kg), last menstrual period 05/21/2016.Body mass index is 57.81 kg/m.  General Appearance: Casual  Eye Contact:  Fair  Speech:  Clear and Coherent  Volume:  Normal  Mood:  Anxious  Affect:  Appropriate  Thought Process:  Goal Directed and Descriptions of Associations: Intact  Orientation:  Full (Time, Place, and Person)  Thought Content: Logical   Suicidal Thoughts:  No  Homicidal  Thoughts:  No  Memory:  Immediate;   Fair Recent;   Fair Remote;   Fair  Judgement:  Fair  Insight:  Fair  Psychomotor Activity:  Normal  Concentration:  Concentration: Fair and Attention Span: Fair  Recall:  [employer] of Knowledge: Fair  Language: Fair  Akathisia:  No  Handed:  Right  AIMS (if indicated): denies any tremors, rigidity, stiffness  Assets:  Communication Skills Desire for Improvement Social Support  ADL's:  Intact  Cognition: WNL  Sleep:  Fair   Screenings: PHQ2-9     Office Visit from 03/19/2018 in [facility] Office Visit from 12/02/2017 in [place] Visit from 09/22/2017 in [facility] Visit from 08/27/2017 in [place] from [facility]  PHQ-2 Total Score  0  0  0  0  0       Assessment and Plan: Erika Hanson is a 52 year old Caucasian female who has a history of depression, anxiety, functional neurological symptom disorder per history, presented to the clinic today for a follow-up visit.  Patient continues to struggle with psychosocial stressors, her own health issues, relationship struggles with her son, morbid obesity and so on.  She reports she recently had workup done for her back and the doctors were able to find out problems with her back and she is currently scheduled for surgery next week.  She reports she is happy that they were able to find out the cause for her numbness and tingling and she is currently reassured that it is not all in her mind.  Her neurologist diagnosed her with functional neurological symptom disorder in the past.  Patient however has a history of morbid obesity, vitamin D deficiency, on vitamin D replacement, folate and iron replacement since they are also deficient.  Patient reports she also has significant lower back issues .  She  reports she is willing to start IOP program at [facility] soon after her  surgery.  We will send a referral to Ms. Velva Harman.  Plan as noted below.  Plan Depression Continue Cymbalta 90 mg p.o. daily.  Initiated by her neurologist.  It also helps with the neurology call pain. Continue Wellbutrin XL 150 mg p.o. daily Continue trazodone 50-100 mg p.o. nightly as needed for sleep Continue Lamictal 50 mg p.o. daily.  For anxiety symptoms  BuSpar  15 mg p.o. 3 times daily .  We will continue same dosage today. She has been limiting her Klonopin use.  Discussed with her to gradually wean off. She is aware about the risk of being on benzodiazepine therapy for long-term.   Hx of Functional neurological symptom disorder Reviewed medical records.  Her neurologist had diagnosed her with the same per report however could not find a specific diagnosis documented in her [place] [facility].  Patient had multiple imaging done including CT, MRI scans of brain, spinal cord in the past.  Patient however recently had MRI scans/CT scans of her back which came back as abnormal.  Patient hence is scheduled for back surgery next week.  Patient also has EEG pending.  Patient has been educated about functional neurological symptom disorder.  Discussed with her about multidisciplinary approach.  Also discussed IOP referral.  At this time patient needs to be monitored closely since some of her neurological problems are due to her back issues.  However she also has a history of bowel and bladder incontinence-unexplained. Will refer patient for IOP. Patient can also continue therapy with Ms. Peacock.  Patient also has a history of vitamin D deficiency, iron deficiency, folate deficiency, morbid obesity-currently being managed by her provider.  Follow-up in clinic in 4 weeks or sooner if needed.  More than 50 % of the time was spent for psychoeducation and supportive psychotherapy and care coordination.  This note was generated in part or whole with voice recognition software. Voice recognition is usually quite  accurate but there are transcription errors that can and very often do occur. I apologize for any typographical errors that were not detected and corrected.        Ursula Alert, MD 04/17/2018, 11:56 AM

## 2018-04-17 ENCOUNTER — Ambulatory Visit (INDEPENDENT_AMBULATORY_CARE_PROVIDER_SITE_OTHER): Payer: BLUE CROSS/BLUE SHIELD

## 2018-04-17 ENCOUNTER — Ambulatory Visit (INDEPENDENT_AMBULATORY_CARE_PROVIDER_SITE_OTHER): Payer: BLUE CROSS/BLUE SHIELD | Admitting: Internal Medicine

## 2018-04-17 ENCOUNTER — Encounter: Payer: Self-pay | Admitting: Psychiatry

## 2018-04-17 ENCOUNTER — Other Ambulatory Visit: Payer: Self-pay | Admitting: Orthopedic Surgery

## 2018-04-17 ENCOUNTER — Other Ambulatory Visit: Payer: Self-pay | Admitting: Internal Medicine

## 2018-04-17 ENCOUNTER — Encounter: Payer: Self-pay | Admitting: Internal Medicine

## 2018-04-17 VITALS — BP 136/80 | HR 68 | Temp 97.6°F | Ht 64.0 in | Wt 334.0 lb

## 2018-04-17 DIAGNOSIS — M79672 Pain in left foot: Secondary | ICD-10-CM

## 2018-04-17 DIAGNOSIS — M5412 Radiculopathy, cervical region: Secondary | ICD-10-CM | POA: Diagnosis not present

## 2018-04-17 DIAGNOSIS — M7989 Other specified soft tissue disorders: Secondary | ICD-10-CM | POA: Insufficient documentation

## 2018-04-17 DIAGNOSIS — I10 Essential (primary) hypertension: Secondary | ICD-10-CM

## 2018-04-17 DIAGNOSIS — M79671 Pain in right foot: Secondary | ICD-10-CM

## 2018-04-17 DIAGNOSIS — G8929 Other chronic pain: Secondary | ICD-10-CM | POA: Insufficient documentation

## 2018-04-17 HISTORY — DX: Pain in right foot: M79.671

## 2018-04-17 HISTORY — DX: Pain in right foot: M79.672

## 2018-04-17 HISTORY — DX: Other specified soft tissue disorders: M79.89

## 2018-04-17 NOTE — Patient Instructions (Signed)
We will refer you to Dr. Bary Castilla for your arm in [place]   Achilles Tendinitis Achilles tendinitis is inflammation of the tough, cord-like band that attaches the lower leg muscles to the heel bone (Achilles tendon). This is usually caused by overusing the tendon and the ankle joint. Achilles tendinitis usually gets better over time with treatment and caring for yourself at home. It can take weeks or months to heal completely. What are the causes? This condition may be caused by:  A sudden increase in exercise or activity, such as running.  Doing the same exercises or activities (such as jumping) over and over.  Not warming up calf muscles before exercising.  Exercising in shoes that are worn out or not made for exercise.  Having arthritis or a bone growth (spur) on the back of the heel bone. This can rub against the tendon and hurt it.  Age-related wear and tear. Tendons become less flexible with age and more likely to be injured.  What are the signs or symptoms? Common symptoms of this condition include:  Pain in the Achilles tendon or in the back of the leg, just above the heel. The pain usually gets worse with exercise.  Stiffness or soreness in the back of the leg, especially in the morning.  Swelling of the skin over the Achilles tendon.  Thickening of the tendon.  Bone spurs at the bottom of the Achilles tendon, near the heel.  Trouble standing on tiptoe.  How is this diagnosed? This condition is diagnosed based on your symptoms and a physical exam. You may have tests, including:  X-rays.  MRI.  How is this treated? The goal of treatment is to relieve symptoms and help your injury heal. Treatment may include:  Decreasing or stopping activities that caused the tendinitis. This may mean switching to low-impact exercises like biking or swimming.  Icing the injured area.  Doing physical therapy, including strengthening and stretching exercises.  NSAIDs to  help relieve pain and swelling.  Using supportive shoes, wraps, heel lifts, or a walking boot (air cast).  Surgery. This may be done if your symptoms do not improve after 6 months.  Using high-energy shock wave impulses to stimulate the healing process (extracorporeal shock wave therapy). This is rare.  Injection of medicines to help relieve inflammation (corticosteroids). This is rare.  Follow these instructions at home: If you have an air cast:  Wear the cast as told by your health care provider. Remove it only as told by your health care provider.  Loosen the cast if your toes tingle, become numb, or turn cold and blue. Activity  Gradually return to your normal activities once your health care provider approves. Do not do activities that cause pain. ? Consider doing low-impact exercises, like cycling or swimming.  If you have an air cast, ask your health care provider when it is safe for you to drive.  If physical therapy was prescribed, do exercises as told by your health care provider or physical therapist. Managing pain, stiffness, and swelling  Raise (elevate) your foot above the level of your heart while you are sitting or lying down.  Move your toes often to avoid stiffness and to lessen swelling.  If directed, put ice on the injured area: ? Put ice in a plastic bag. ? Place a towel between your skin and the bag. ? Leave the ice on for 20 minutes, 2-3 times a day General instructions  If directed, wrap your foot with an  elastic bandage or other wrap. This can help keep your tendon from moving too much while it heals. Your health care provider will show you how to wrap your foot correctly.  Wear supportive shoes or heel lifts only as told by your health care provider.  Take over-the-counter and prescription medicines only as told by your health care provider.  Keep all follow-up visits as told by your health care provider. This is important. Contact a health care  provider if:  You have symptoms that gets worse.  You have pain that does not get better with medicine.  You develop new, unexplained symptoms.  You develop warmth and swelling in your foot.  You have a fever. Get help right away if:  You have a sudden popping sound or sensation in your Achilles tendon followed by severe pain.  You cannot move your toes or foot.  You cannot put any weight on your foot. Summary  Achilles tendinitis is inflammation of the tough, cord-like band that attaches the lower leg muscles to the heel bone (Achilles tendon).  This condition is usually caused by overusing the tendon and the ankle joint. It can also be caused by arthritis or normal aging.  The most common symptoms of this condition include pain, swelling, or stiffness in the Achilles tendon or in the back of the leg.  This condition is usually treated with rest, NSAIDs, and physical therapy. This information is not intended to replace advice given to you by your health care provider. Make sure you discuss any questions you have with your health care provider. Document Released: 08/21/2005 Document Revised: 09/30/2016 Document Reviewed: 09/30/2016 Elsevier Interactive Patient Education  2017 [employer].

## 2018-04-17 NOTE — Progress Notes (Signed)
Pre visit review using our clinic review tool, if applicable. No additional management support is needed unless otherwise documented below in the visit note. 

## 2018-04-17 NOTE — Progress Notes (Addendum)
Chief Complaint  Patient presents with  . Follow-up   F/u  1.cervical radiculopathy having cervical neck surgery 04/21/18 with Dr. Lynann Bologna  2. C/o left forearm swelling which is getting more hard and MRI with contusion vs fat necrosis. The area on left forearm is extending per pt and larger area and harder than before. No h/o trauma  3. C/o b/l heel pain and swelling at achilles and left lower leg swelling x 1 year since had TENS unit placed at high frequency to left lower leg and noticed left lower leg swelling since then 4. HTN controlled on hctz 25 mg qd losartan 50 mg qd    Review of Systems  Constitutional: Negative for weight loss.  HENT: Negative for hearing loss.   Eyes: Negative for blurred vision.  Respiratory: Negative for shortness of breath.   Cardiovascular: Positive for leg swelling.       Upper ext swelling b/l   Musculoskeletal: Positive for back pain and neck pain.  Skin: Negative for rash.  Neurological: Positive for tingling and sensory change.   Past Medical History:  Diagnosis Date  . Acid reflux   . Anemia, iron deficiency 09/22/2017  . Anxiety   . Depression   . Fatty liver   . Fibroid 03/19/2018  . H/O Bell's palsy    right in 2003   . Hypertension   . Insomnia   . Morbid obesity ([place])   . Muscle spasm   . Nocturia   . Numbness in feet   . Weakness    Past Surgical History:  Procedure Laterality Date  . CESAREAN SECTION     Family History  Problem Relation Age of Onset  . Hypertension Mother   . Depression Mother   . Alzheimer's disease Father   . Diabetes Father   . Hypertension Father   . Drug abuse Brother   . Anxiety disorder Brother   . Depression Brother   . Breast cancer Neg Hx    Social History   Socioeconomic History  . Marital status: Divorced    Spouse name: Not on file  . Number of children: 1  . Years of education: HS  . Highest education level: High school graduate  Occupational History  . Occupation: Collects eggs     Comment: not employed  Social Needs  . Financial resource strain: Not hard at all  . Food insecurity:    Worry: Never true    Inability: Never true  . Transportation needs:    Medical: No    Non-medical: No  Tobacco Use  . Smoking status: Never Smoker  . Smokeless tobacco: Never Used  Substance and Sexual Activity  . Alcohol use: No  . Drug use: No  . Sexual activity: Yes    Birth control/protection: None  Lifestyle  . Physical activity:    Days per week: 0 days    Minutes per session: 0 min  . Stress: Very much  Relationships  . Social connections:    Talks on phone: Three times a week    Gets together: Three times a week    Attends religious service: Never    Active member of club or organization: No    Attends meetings of clubs or organizations: Never    Relationship status: Divorced  . Intimate partner violence:    Fear of current or ex partner: No    Emotionally abused: No    Physically abused: No    Forced sexual activity: No  Other Topics Concern  .  Not on file  Social History Narrative   Lives at home with her son.   Right-handed.   8 cups tea and 1 can of [facility] per day.   As of 03/19/18 pt has disability pending and not working    Current Meds  Medication Sig  . buPROPion (WELLBUTRIN XL) 150 MG 24 hr tablet Take 1 tablet (150 mg total) by mouth daily.  . busPIRone (BUSPAR) 15 MG tablet Take 1 tablet (15 mg total) by mouth 3 (three) times daily.  . celecoxib (CELEBREX) 100 MG capsule Take 1 capsule (100 mg total) by mouth 2 (two) times daily.  . Cholecalciferol 50000 units capsule Take 1 capsule (50,000 Units total) by mouth once a week.  . clonazePAM (KLONOPIN) 0.5 MG tablet TAKE 1 TABLET BY MOUTH TWICE DAILY AS NEEDED FOR ANXIETY  . DULoxetine (CYMBALTA) 30 MG capsule Total of  90mg  daily  . DULoxetine (CYMBALTA) 60 MG capsule Take 90 mg daily (60 mg + 30 mg)  . ferrous sulfate 325 (65 FE) MG tablet Take 1 tablet (325 mg total) 2 (two) times  daily with a meal by mouth.  . hydrochlorothiazide (HYDRODIURIL) 25 MG tablet TAKE 1 TABLET BY MOUTH ONCE A DAY  . lamoTRIgine (LAMICTAL) 25 MG tablet Take 2 tablets (50 mg total) by mouth daily.  Marland Kitchen linaclotide (LINZESS) 290 MCG CAPS capsule Take 1 capsule (290 mcg total) by mouth daily before breakfast.  . losartan (COZAAR) 50 MG tablet TAKE 1 TABLET BY MOUTH ONCE A DAY FOR HIGH BLOOD PRESSURE.  Marland Kitchen omeprazole (PRILOSEC) 20 MG capsule Take 20 mg by mouth at bedtime.  Marland Kitchen tiZANidine (ZANAFLEX) 4 MG tablet START WITH 1/2 TABLET BY MOUTH (2 MG) DAILY AND INCREASE TO 1 TABLET (4 MG) THREE TIMES DAILY AS TOLERATED FOR MUSCLE SPASTICITY.  Marland Kitchen topiramate (TOPAMAX) 200 MG tablet Take 1 tablet (200 mg total) by mouth 2 (two) times daily.  . traZODone (DESYREL) 50 MG tablet Take 1-2 tablets (50-100 mg total) by mouth at bedtime as needed. for sleep   No Known Allergies Recent Results (from the past 2160 hour(s))  Urinalysis, Routine w reflex microscopic     Status: Abnormal   Collection Time: 03/20/18 10:09 AM  Result Value Ref Range   Specific Gravity, UA 1.011 1.005 - 1.030   pH, UA 8.0 (H) 5.0 - 7.5   Color, UA Yellow Yellow   Appearance Ur Clear Clear   Leukocytes, UA Trace (A) Negative   Protein, UA Negative Negative/Trace   Glucose, UA Negative Negative   Ketones, UA Negative Negative   RBC, UA Negative Negative   Bilirubin, UA Negative Negative   Urobilinogen, Ur 0.2 0.2 - 1.0 mg/dL   Nitrite, UA Negative Negative   Microscopic Examination See below:     Comment: Microscopic was indicated and was performed.  Vitamin A     Status: None   Collection Time: 03/20/18 10:09 AM  Result Value Ref Range   Vitamin A (Retinoic Acid) 65 38 - 98 mcg/dL    Comment: Marland Kitchen Vitamin supplementation within 24 hours prior to blood draw may affect the accuracy of the results. . This test was developed and its analytical performance characteristics have been determined by [place], [place]. It has not been cleared or approved by the U.S. Food and Drug Administration. This assay has been validated pursuant to the CLIA regulations and is used for clinical purposes. .   Zinc     Status: None  Collection Time: 03/20/18 10:09 AM  Result Value Ref Range   Zinc 64 60 - 130 mcg/dL    Comment: . This test was developed and its analytical performance characteristics have been determined by [place], [place]. It has not been cleared or approved by the U.S. Food and Drug Administration. This assay has been validated pursuant to the CLIA regulations and is used for clinical purposes. Marland Kitchen   HIV antibody (with reflex)     Status: None   Collection Time: 03/20/18 10:09 AM  Result Value Ref Range   HIV 1&2 Ab, 4th Generation NON-REACTIVE NON-REACTI    Comment: HIV-1 antigen and HIV-1/HIV-2 antibodies were not detected. There is no laboratory evidence of HIV infection. Marland Kitchen PLEASE NOTE: This information has been disclosed to you from records whose confidentiality may be protected by state law.  If your state requires such protection, then the state law prohibits you from making any further disclosure of the information without the specific written consent of the person to whom it pertains, or as otherwise permitted by law. A general authorization for the release of medical or other information is NOT sufficient for this purpose. . For additional information please refer to http://education.quest[facility].com/faq/FAQ106 (This link is being provided for informational/ educational purposes only.) . Marland Kitchen The performance of this assay has not been clinically validated in patients less than 40 years old. .   Hepatitis A antibody, IgM     Status: None   Collection Time: 03/20/18 10:09 AM  Result Value Ref Range   Hep A IgM NON-REACTIVE NON-REACTI  Hepatitis A Ab, Total     Status: None   Collection Time: 03/20/18 10:09 AM  Result  Value Ref Range   Hepatitis A AB,Total NON-REACTIVE NON-REACTI  Hepatitis C antibody     Status: None   Collection Time: 03/20/18 10:09 AM  Result Value Ref Range   Hepatitis C Ab NON-REACTIVE NON-REACTI   SIGNAL TO CUT-OFF 0.01 <1.00    Comment: . HCV antibody was non-reactive. There is no laboratory  evidence of HCV infection. . In most cases, no further action is required. However, if recent HCV exposure is suspected, a test for HCV RNA (test code [id number]) is suggested. . For additional information please refer to http://education.[facility].com/faq/FAQ22v1 (This link is being provided for informational/ educational purposes only.) .   Hepatitis B surface antigen     Status: None   Collection Time: 03/20/18 10:09 AM  Result Value Ref Range   Hepatitis B Surface Ag NON-REACTIVE NON-REACTI  Hepatitis B surface antibody     Status: Abnormal   Collection Time: 03/20/18 10:09 AM  Result Value Ref Range   Hepatitis B-Post <5 (L) > OR = 10 mIU/mL    Comment: . Patient does not have immunity to hepatitis B virus. . For additional information, please refer to http://education.[facility].com/faq/FAQ105 (This link is being provided for informational/ educational purposes only).   RPR     Status: None   Collection Time: 03/20/18 10:09 AM  Result Value Ref Range   RPR Ser Ql NON-REACTIVE NON-REACTI  Protein Electrophoresis, (serum)     Status: Abnormal   Collection Time: 03/20/18 10:09 AM  Result Value Ref Range   Total Protein 6.6 6.1 - 8.1 g/dL   Albumin ELP 3.7 (L) 3.8 - 4.8 g/dL   Alpha 1 0.3 0.2 - 0.3 g/dL   Alpha 2 0.9 0.5 - 0.9 g/dL   Beta Globulin 0.5 0.4 - 0.6 g/dL   Beta 2 0.4  0.2 - 0.5 g/dL   Gamma Globulin 0.8 0.8 - 1.7 g/dL   SPE Interp.      Comment: . Evaluation reveals an isolated decrease in albumin. This pattern is suggestive of decreased protein  synthesis or protein loss. Consider ordering pre- albumin quantitation. .   Protein  Electrophoresis, Urine Rflx.     Status: None   Collection Time: 03/20/18 10:09 AM  Result Value Ref Range   Protein, Ur 8.3 Not Estab. mg/dL   Albumin ELP, Urine 61.2 %   Alpha-1-Globulin, U 3.1 %   Alpha-2-Globulin, U 7.8 %   Beta Globulin, U 13.7 %   Gamma Globulin, U 14.2 %   M Component, Ur Not Observed Not Observed %   Please Note: Comment     Comment: Protein electrophoresis scan will follow via computer, mail, or courier delivery.   Microscopic Examination     Status: None   Collection Time: 03/20/18 10:09 AM  Result Value Ref Range   WBC, UA 0-5 0 - 5 /hpf   RBC, UA 0-2 0 - 2 /hpf   Epithelial Cells (non renal) 0-10 0 - 10 /hpf   Casts None seen None seen /lpf   Mucus, UA Present Not Estab.   Bacteria, UA Few None seen/Few  TEST AUTHORIZATION     Status: None   Collection Time: 03/20/18 10:09 AM  Result Value Ref Range   TEST NAME: VITAMIN B12/FOLATE, LIPID PAN    TEST CODE: [id number]    CLIENT CONTACT: LATOYA WRIGHT    REPORT ALWAYS MESSAGE SIGNATURE      Comment: . The laboratory testing on this patient was verbally requested or confirmed by the ordering physician or his or her authorized representative after contact with an employee of [employer]. Federal regulations require that we maintain on file written authorization for all laboratory testing.  Accordingly we are asking that the ordering physician or his or her authorized representative sign a copy of this report and promptly return it to the client service representative. . . Signature:____________________________________________________ . Please fax this signed page to [phone] or return it via your [employer] courier.   Lipid panel     Status: Abnormal   Collection Time: 03/20/18 10:09 AM  Result Value Ref Range   Cholesterol 219 (H) <200 mg/dL   HDL 69 >50 mg/dL   Triglycerides 108 <150 mg/dL   LDL Cholesterol (Calc) 128 (H) mg/dL (calc)    Comment:  Reference range: <100 . Desirable range <100 mg/dL for primary prevention;   <70 mg/dL for patients with CHD or diabetic patients  with > or = 2 CHD risk factors. Marland Kitchen LDL-C is now calculated using the Martin-Hopkins  calculation, which is a validated novel method providing  better accuracy than the Friedewald equation in the  estimation of LDL-C.  Cresenciano Genre et al. Annamaria Helling. 8841;660(63): 2061-2068  (http://education.[facility].com/faq/FAQ164)    Total CHOL/HDL Ratio 3.2 <5.0 (calc)   Non-HDL Cholesterol (Calc) 150 (H) <130 mg/dL (calc)    Comment: For patients with diabetes plus 1 major ASCVD risk  factor, treating to a non-HDL-C goal of <100 mg/dL  (LDL-C of <70 mg/dL) is considered a therapeutic  option.   TSH     Status: None   Collection Time: 03/20/18 10:09 AM  Result Value Ref Range   TSH 2.74 mIU/L    Comment:           Reference Range .           >  or = 20 Years  0.40-4.50 .                Pregnancy Ranges           First trimester    0.26-2.66           Second trimester   0.55-2.73           Third trimester    0.43-2.91   T4, free     Status: None   Collection Time: 03/20/18 10:09 AM  Result Value Ref Range   Free T4 1.3 0.8 - 1.8 ng/dL  B12 and Folate Panel     Status: None   Collection Time: 03/20/18 10:09 AM  Result Value Ref Range   Vitamin B-12 490 200 - 1,100 pg/mL   Folate >24.0 ng/mL    Comment:                            Reference Range                            Low:           <3.4                            Borderline:    3.4-5.4                            Normal:        >5.4 .   Heavy Metals Profile, Urine     Status: None   Collection Time: 03/23/18 12:34 PM  Result Value Ref Range   Arsenic, 24H Ur <10 <=80 mcg/L    Comment: . This test was developed and its analytical performance characteristics have been determined by [place], [place]. It has not been cleared or approved by the U.S. Food and  Drug Administration. This assay has been validated pursuant to the CLIA regulations and is used for clinical purposes. .    Lead, 24 hr urine <10 <80 mcg/L    Comment: . This test was developed and its analytical performance characteristics have been determined by [place], [place]. It has not been cleared or approved by the U.S. Food and Drug Administration. This assay has been validated pursuant to the CLIA regulations and is used for clinical purposes. .    Mercury, 24H Ur <4 <=20 mcg/L    Comment: .                       TOXIC: > OR = 150 mcg/L . Marland Kitchen This test was developed and its analytical performance characteristics have been determined by [place], [place]. It has not been cleared or approved by the U.S. Food and Drug Administration. This assay has been validated pursuant to the CLIA regulations and is used for clinical purposes. .    Objective  Body mass index is 57.33 kg/m. Wt Readings from Last 3 Encounters:  04/17/18 (!) 334 lb (151.5 kg)  04/09/18 (!) 334 lb 6.4 oz (151.7 kg)  03/19/18 (!) 334 lb 12.8 oz (151.9 kg)   Temp Readings from Last 3 Encounters:  04/17/18 97.6 F (36.4 C) (Oral)  03/19/18 98.5 F (36.9 C) (Oral)  02/06/18 (!) 97.5 F (36.4 C) (Oral)  BP Readings from Last 3 Encounters:  04/17/18 136/80  04/09/18 115/69  03/19/18 128/78   Pulse Readings from Last 3 Encounters:  04/17/18 68  04/09/18 66  03/19/18 77    Physical Exam  Constitutional: She is oriented to person, place, and time. Vital signs are normal. She appears well-developed and well-nourished. She is cooperative.  HENT:  Head: Normocephalic and atraumatic.  Mouth/Throat: Oropharynx is clear and moist and mucous membranes are normal.  Eyes: Pupils are equal, round, and reactive to light. Conjunctivae are normal.  Cardiovascular: Normal rate, regular rhythm and normal heart sounds.  Pulmonary/Chest:  Effort normal and breath sounds normal.  Neurological: She is alert and oriented to person, place, and time. Gait normal.  Skin: Skin is warm, dry and intact.  Psychiatric: She has a normal mood and affect. Her speech is normal and behavior is normal. Judgment and thought content normal. Cognition and memory are normal.  Nursing note and vitals reviewed.   Assessment   1. Cervical radiculopathy  2. Left forearm swelling with MRI +contusion vs fat necrosis  3. B/l heel pain  4. HM 5. HTN  Plan   1.  Surgery 04/21/18  2. Refer to Dr. Bary Castilla to see if needs surgical excision pt feels like getting larger  3. Xray today left heel consider MRI achilles in future and podiatry referral  4.  Assess vaccines at f/u  -No immunizations found in [facility]   Referred for mammogram pt had 04/09/18 no result yet check on this  Need to refer to OB/GYN for pap in future  Referred for colonoscopy and possibly EGD with h/o iron def anemia  -pt sch 05/18/18 but will need to resch 2/2 #1  DEXA-no current indication  5. Cont meds     Provider: Dr. Olivia Mackie McLean-Scocuzza-Internal Medicine

## 2018-04-18 ENCOUNTER — Other Ambulatory Visit: Payer: Self-pay | Admitting: Family Medicine

## 2018-04-18 ENCOUNTER — Other Ambulatory Visit: Payer: Self-pay | Admitting: Internal Medicine

## 2018-04-18 DIAGNOSIS — M7730 Calcaneal spur, unspecified foot: Secondary | ICD-10-CM

## 2018-04-18 DIAGNOSIS — M7660 Achilles tendinitis, unspecified leg: Secondary | ICD-10-CM

## 2018-04-18 DIAGNOSIS — S5012XD Contusion of left forearm, subsequent encounter: Secondary | ICD-10-CM

## 2018-04-18 DIAGNOSIS — M7989 Other specified soft tissue disorders: Secondary | ICD-10-CM

## 2018-04-21 ENCOUNTER — Encounter (HOSPITAL_COMMUNITY): Payer: Self-pay | Admitting: *Deleted

## 2018-04-21 ENCOUNTER — Other Ambulatory Visit: Payer: Self-pay

## 2018-04-21 ENCOUNTER — Encounter: Payer: Self-pay | Admitting: General Surgery

## 2018-04-21 NOTE — Progress Notes (Signed)
Ms Erika Hanson denies chest pain or shortness of breath at rest.  I instructed patient to not take any more Celebrex or Advil or Aleve.

## 2018-04-21 NOTE — Telephone Encounter (Signed)
Refill denied. No longer [facility] patient.

## 2018-04-22 ENCOUNTER — Ambulatory Visit (HOSPITAL_COMMUNITY): Payer: BLUE CROSS/BLUE SHIELD | Admitting: Anesthesiology

## 2018-04-22 ENCOUNTER — Ambulatory Visit (HOSPITAL_COMMUNITY): Payer: BLUE CROSS/BLUE SHIELD

## 2018-04-22 ENCOUNTER — Observation Stay (HOSPITAL_COMMUNITY)
Admission: RE | Admit: 2018-04-22 | Discharge: 2018-04-23 | Disposition: A | Discharge status: Home / Self Care | Payer: BLUE CROSS/BLUE SHIELD | Source: Ambulatory Visit | Attending: Orthopedic Surgery | Admitting: Orthopedic Surgery

## 2018-04-22 ENCOUNTER — Encounter (INDEPENDENT_AMBULATORY_CARE_PROVIDER_SITE_OTHER): Payer: Self-pay

## 2018-04-22 ENCOUNTER — Telehealth (HOSPITAL_COMMUNITY): Payer: Self-pay | Admitting: Psychiatry

## 2018-04-22 ENCOUNTER — Ambulatory Visit (HOSPITAL_COMMUNITY)
Admission: RE | Disposition: A | Discharge status: Home / Self Care | Payer: Self-pay | Source: Ambulatory Visit | Attending: Orthopedic Surgery

## 2018-04-22 ENCOUNTER — Encounter (HOSPITAL_COMMUNITY): Payer: Self-pay

## 2018-04-22 DIAGNOSIS — Z79899 Other long term (current) drug therapy: Secondary | ICD-10-CM | POA: Insufficient documentation

## 2018-04-22 DIAGNOSIS — M1990 Unspecified osteoarthritis, unspecified site: Secondary | ICD-10-CM | POA: Insufficient documentation

## 2018-04-22 DIAGNOSIS — Z1211 Encounter for screening for malignant neoplasm of colon: Secondary | ICD-10-CM

## 2018-04-22 DIAGNOSIS — Z6841 Body Mass Index (BMI) 40.0 and over, adult: Secondary | ICD-10-CM | POA: Insufficient documentation

## 2018-04-22 DIAGNOSIS — F329 Major depressive disorder, single episode, unspecified: Secondary | ICD-10-CM | POA: Insufficient documentation

## 2018-04-22 DIAGNOSIS — K219 Gastro-esophageal reflux disease without esophagitis: Secondary | ICD-10-CM | POA: Diagnosis not present

## 2018-04-22 DIAGNOSIS — G4700 Insomnia, unspecified: Secondary | ICD-10-CM | POA: Diagnosis not present

## 2018-04-22 DIAGNOSIS — F419 Anxiety disorder, unspecified: Secondary | ICD-10-CM | POA: Insufficient documentation

## 2018-04-22 DIAGNOSIS — I10 Essential (primary) hypertension: Secondary | ICD-10-CM | POA: Diagnosis not present

## 2018-04-22 DIAGNOSIS — Z419 Encounter for procedure for purposes other than remedying health state, unspecified: Secondary | ICD-10-CM

## 2018-04-22 DIAGNOSIS — M5410 Radiculopathy, site unspecified: Secondary | ICD-10-CM | POA: Diagnosis present

## 2018-04-22 DIAGNOSIS — G9529 Other cord compression: Secondary | ICD-10-CM | POA: Diagnosis not present

## 2018-04-22 DIAGNOSIS — G9589 Other specified diseases of spinal cord: Secondary | ICD-10-CM | POA: Diagnosis not present

## 2018-04-22 DIAGNOSIS — M5412 Radiculopathy, cervical region: Secondary | ICD-10-CM | POA: Insufficient documentation

## 2018-04-22 DIAGNOSIS — Z01818 Encounter for other preprocedural examination: Secondary | ICD-10-CM

## 2018-04-22 HISTORY — DX: Radiculopathy, site unspecified: M54.10

## 2018-04-22 HISTORY — DX: Unspecified osteoarthritis, unspecified site: M19.90

## 2018-04-22 HISTORY — DX: Constipation, unspecified: K59.00

## 2018-04-22 HISTORY — PX: ANTERIOR CERVICAL DECOMP/DISCECTOMY FUSION: SHX1161

## 2018-04-22 LAB — COMPREHENSIVE METABOLIC PANEL
ALT: 17 U/L (ref 14–54)
AST: 13 U/L — AB (ref 15–41)
Albumin: 3.4 g/dL — ABNORMAL LOW (ref 3.5–5.0)
Alkaline Phosphatase: 83 U/L (ref 38–126)
Anion gap: 9 (ref 5–15)
BILIRUBIN TOTAL: 0.6 mg/dL (ref 0.3–1.2)
BUN: 26 mg/dL — AB (ref 6–20)
CHLORIDE: 105 mmol/L (ref 101–111)
CO2: 26 mmol/L (ref 22–32)
CREATININE: 1.23 mg/dL — AB (ref 0.44–1.00)
Calcium: 9.2 mg/dL (ref 8.9–10.3)
GFR, EST AFRICAN AMERICAN: 59 mL/min — AB (ref >60)
GFR, EST NON AFRICAN AMERICAN: 51 mL/min — AB (ref >60)
Glucose, Bld: 109 mg/dL — ABNORMAL HIGH (ref 65–99)
POTASSIUM: 3.5 mmol/L (ref 3.5–5.1)
Sodium: 140 mmol/L (ref 135–145)
TOTAL PROTEIN: 6.5 g/dL (ref 6.5–8.1)

## 2018-04-22 LAB — CBC WITH DIFFERENTIAL/PLATELET
ABS IMMATURE GRANULOCYTES: 0 10*3/uL (ref 0.0–0.1)
BASOS ABS: 0 10*3/uL (ref 0.0–0.1)
Basophils Relative: 1 %
EOS PCT: 3 %
Eosinophils Absolute: 0.2 10*3/uL (ref 0.0–0.7)
HCT: 38.5 % (ref 36.0–46.0)
HEMOGLOBIN: 12.2 g/dL (ref 12.0–15.0)
Immature Granulocytes: 1 %
LYMPHS PCT: 24 %
Lymphs Abs: 1.4 10*3/uL (ref 0.7–4.0)
MCH: 28.6 pg (ref 26.0–34.0)
MCHC: 31.7 g/dL (ref 30.0–36.0)
MCV: 90.2 fL (ref 78.0–100.0)
Monocytes Absolute: 0.5 10*3/uL (ref 0.1–1.0)
Monocytes Relative: 8 %
NEUTROS ABS: 3.7 10*3/uL (ref 1.7–7.7)
Neutrophils Relative %: 63 %
PLATELETS: 222 10*3/uL (ref 150–400)
RBC: 4.27 MIL/uL (ref 3.87–5.11)
RDW: 14.2 % (ref 11.5–15.5)
WBC: 5.7 10*3/uL (ref 4.0–10.5)

## 2018-04-22 LAB — URINALYSIS, ROUTINE W REFLEX MICROSCOPIC
BILIRUBIN URINE: NEGATIVE
GLUCOSE, UA: NEGATIVE mg/dL
Hgb urine dipstick: NEGATIVE
Ketones, ur: NEGATIVE mg/dL
Leukocytes, UA: NEGATIVE
Nitrite: NEGATIVE
PH: 6 (ref 5.0–8.0)
PROTEIN: NEGATIVE mg/dL
Specific Gravity, Urine: 1.018 (ref 1.005–1.030)

## 2018-04-22 LAB — PROTIME-INR
INR: 1.06
Prothrombin Time: 13.7 seconds (ref 11.4–15.2)

## 2018-04-22 LAB — APTT: APTT: 29 s (ref 24–36)

## 2018-04-22 SURGERY — ANTERIOR CERVICAL DECOMPRESSION/DISCECTOMY FUSION 2 LEVELS
Anesthesia: General | Site: Spine Cervical

## 2018-04-22 MED ORDER — CEFAZOLIN SODIUM-DEXTROSE 2-4 GM/100ML-% IV SOLN
2.0000 g | Freq: Three times a day (TID) | INTRAVENOUS | Status: AC
  Administered 2018-04-22 – 2018-04-23 (×2): 2 g via INTRAVENOUS
  Filled 2018-04-22 (×2): qty 100

## 2018-04-22 MED ORDER — FENTANYL CITRATE (PF) 100 MCG/2ML IJ SOLN
INTRAMUSCULAR | Status: AC
  Filled 2018-04-22: qty 2

## 2018-04-22 MED ORDER — FENTANYL CITRATE (PF) 250 MCG/5ML IJ SOLN
INTRAMUSCULAR | Status: AC
Start: 2018-04-22 — End: ?
  Filled 2018-04-22: qty 5

## 2018-04-22 MED ORDER — BUPIVACAINE-EPINEPHRINE (PF) 0.25% -1:200000 IJ SOLN
INTRAMUSCULAR | Status: AC
  Filled 2018-04-22: qty 30

## 2018-04-22 MED ORDER — DIAZEPAM 5 MG PO TABS
5.0000 mg | ORAL_TABLET | Freq: Four times a day (QID) | ORAL | Status: DC | PRN
  Administered 2018-04-22 – 2018-04-23 (×3): 5 mg via ORAL
  Filled 2018-04-22 (×3): qty 1

## 2018-04-22 MED ORDER — SUGAMMADEX SODIUM 500 MG/5ML IV SOLN
INTRAVENOUS | Status: AC
  Filled 2018-04-22: qty 5

## 2018-04-22 MED ORDER — LIDOCAINE 2% (20 MG/ML) 5 ML SYRINGE
INTRAMUSCULAR | Status: DC | PRN
  Administered 2018-04-22: 60 mg via INTRAVENOUS

## 2018-04-22 MED ORDER — FENTANYL CITRATE (PF) 100 MCG/2ML IJ SOLN
25.0000 ug | INTRAMUSCULAR | Status: DC | PRN
  Administered 2018-04-22: 50 ug via INTRAVENOUS

## 2018-04-22 MED ORDER — MIDAZOLAM HCL 5 MG/5ML IJ SOLN
INTRAMUSCULAR | Status: DC | PRN
  Administered 2018-04-22: 2 mg via INTRAVENOUS

## 2018-04-22 MED ORDER — MIDAZOLAM HCL 2 MG/2ML IJ SOLN
INTRAMUSCULAR | Status: AC
  Filled 2018-04-22: qty 2

## 2018-04-22 MED ORDER — HYDROCHLOROTHIAZIDE 25 MG PO TABS
25.0000 mg | ORAL_TABLET | Freq: Every day | ORAL | Status: DC
  Administered 2018-04-23: 25 mg via ORAL
  Filled 2018-04-22: qty 1

## 2018-04-22 MED ORDER — LAMOTRIGINE 25 MG PO TABS
50.0000 mg | ORAL_TABLET | Freq: Every day | ORAL | Status: DC
  Administered 2018-04-23: 50 mg via ORAL
  Filled 2018-04-22: qty 2

## 2018-04-22 MED ORDER — BUSPIRONE HCL 15 MG PO TABS
15.0000 mg | ORAL_TABLET | Freq: Three times a day (TID) | ORAL | Status: DC
  Administered 2018-04-22 – 2018-04-23 (×3): 15 mg via ORAL
  Filled 2018-04-22 (×5): qty 1

## 2018-04-22 MED ORDER — FENTANYL CITRATE (PF) 250 MCG/5ML IJ SOLN
INTRAMUSCULAR | Status: DC | PRN
  Administered 2018-04-22: 100 ug via INTRAVENOUS
  Administered 2018-04-22 (×4): 50 ug via INTRAVENOUS

## 2018-04-22 MED ORDER — DEXAMETHASONE SODIUM PHOSPHATE 10 MG/ML IJ SOLN
INTRAMUSCULAR | Status: AC
  Filled 2018-04-22: qty 1

## 2018-04-22 MED ORDER — CHOLECALCIFEROL 1.25 MG (50000 UT) PO CAPS: 50000.0000 [IU] | ORAL_CAPSULE | ORAL | Status: DC

## 2018-04-22 MED ORDER — LOSARTAN POTASSIUM 50 MG PO TABS
50.0000 mg | ORAL_TABLET | Freq: Every day | ORAL | Status: DC
  Administered 2018-04-23: 50 mg via ORAL
  Filled 2018-04-22: qty 1

## 2018-04-22 MED ORDER — OXYCODONE-ACETAMINOPHEN 5-325 MG PO TABS
1.0000 | ORAL_TABLET | ORAL | Status: DC | PRN
  Administered 2018-04-22 – 2018-04-23 (×4): 2 via ORAL
  Filled 2018-04-22 (×4): qty 2

## 2018-04-22 MED ORDER — PHENYLEPHRINE 40 MCG/ML (10ML) SYRINGE FOR IV PUSH (FOR BLOOD PRESSURE SUPPORT)
PREFILLED_SYRINGE | INTRAVENOUS | Status: AC
  Filled 2018-04-22: qty 20

## 2018-04-22 MED ORDER — ACETAMINOPHEN 650 MG RE SUPP: 650.0000 mg | RECTAL | Status: DC | PRN

## 2018-04-22 MED ORDER — BISACODYL 5 MG PO TBEC: 5.0000 mg | DELAYED_RELEASE_TABLET | Freq: Every day | ORAL | Status: DC | PRN

## 2018-04-22 MED ORDER — PANTOPRAZOLE SODIUM 40 MG PO TBEC
40.0000 mg | DELAYED_RELEASE_TABLET | Freq: Every day | ORAL | Status: DC
  Administered 2018-04-22 – 2018-04-23 (×2): 40 mg via ORAL
  Filled 2018-04-22 (×2): qty 1

## 2018-04-22 MED ORDER — THROMBIN 20000 UNITS EX SOLR
CUTANEOUS | Status: AC
  Filled 2018-04-22: qty 20000

## 2018-04-22 MED ORDER — ROCURONIUM BROMIDE 50 MG/5ML IV SOLN
INTRAVENOUS | Status: AC
  Filled 2018-04-22: qty 1

## 2018-04-22 MED ORDER — ROCURONIUM BROMIDE 10 MG/ML (PF) SYRINGE: PREFILLED_SYRINGE | INTRAVENOUS | Status: DC | PRN

## 2018-04-22 MED ORDER — SODIUM CHLORIDE 0.9% FLUSH: 3.0000 mL | INTRAVENOUS | Status: DC | PRN

## 2018-04-22 MED ORDER — DULOXETINE HCL 30 MG PO CPEP: 60.0000 mg | ORAL_CAPSULE | Freq: Every day | ORAL | Status: DC

## 2018-04-22 MED ORDER — LINACLOTIDE 145 MCG PO CAPS
290.0000 ug | ORAL_CAPSULE | Freq: Every day | ORAL | Status: DC
  Filled 2018-04-22: qty 2

## 2018-04-22 MED ORDER — THROMBIN (RECOMBINANT) 20000 UNITS EX SOLR
CUTANEOUS | Status: DC | PRN
  Administered 2018-04-22: 20 mL via TOPICAL

## 2018-04-22 MED ORDER — CEFAZOLIN SODIUM-DEXTROSE 2-4 GM/100ML-% IV SOLN: 2.0000 g | INTRAVENOUS | Status: DC

## 2018-04-22 MED ORDER — ROCURONIUM BROMIDE 100 MG/10ML IV SOLN
INTRAVENOUS | Status: DC | PRN
  Administered 2018-04-22: 20 mg via INTRAVENOUS
  Administered 2018-04-22: 10 mg via INTRAVENOUS
  Administered 2018-04-22: 20 mg via INTRAVENOUS
  Administered 2018-04-22: 50 mg via INTRAVENOUS

## 2018-04-22 MED ORDER — FLEET ENEMA 7-19 GM/118ML RE ENEM: 1.0000 | ENEMA | Freq: Once | RECTAL | Status: DC | PRN

## 2018-04-22 MED ORDER — ONDANSETRON HCL 4 MG PO TABS: 4.0000 mg | ORAL_TABLET | Freq: Four times a day (QID) | ORAL | Status: DC | PRN

## 2018-04-22 MED ORDER — PHENOL 1.4 % MT LIQD
1.0000 | OROMUCOSAL | Status: DC | PRN
  Administered 2018-04-22: 1 via OROMUCOSAL
  Filled 2018-04-22: qty 177

## 2018-04-22 MED ORDER — CEFAZOLIN SODIUM 10 G IJ SOLR
3.0000 g | INTRAMUSCULAR | Status: AC
  Administered 2018-04-22: 3 g via INTRAVENOUS
  Filled 2018-04-22: qty 3

## 2018-04-22 MED ORDER — LACTATED RINGERS IV SOLN
INTRAVENOUS | Status: DC | PRN
  Administered 2018-04-22 (×2): via INTRAVENOUS

## 2018-04-22 MED ORDER — DULOXETINE HCL 30 MG PO CPEP
90.0000 mg | ORAL_CAPSULE | Freq: Every day | ORAL | Status: DC
  Administered 2018-04-23: 90 mg via ORAL
  Filled 2018-04-22: qty 3

## 2018-04-22 MED ORDER — TRAZODONE HCL 50 MG PO TABS
50.0000 mg | ORAL_TABLET | Freq: Every evening | ORAL | Status: DC | PRN
  Filled 2018-04-22: qty 2

## 2018-04-22 MED ORDER — ACETAMINOPHEN 325 MG PO TABS: 650.0000 mg | ORAL_TABLET | ORAL | Status: DC | PRN

## 2018-04-22 MED ORDER — BUPIVACAINE-EPINEPHRINE 0.25% -1:200000 IJ SOLN
INTRAMUSCULAR | Status: DC | PRN
  Administered 2018-04-22: 7 mL

## 2018-04-22 MED ORDER — ONDANSETRON HCL 4 MG/2ML IJ SOLN
INTRAMUSCULAR | Status: DC | PRN
  Administered 2018-04-22: 4 mg via INTRAVENOUS

## 2018-04-22 MED ORDER — POVIDONE-IODINE 7.5 % EX SOLN
Freq: Once | CUTANEOUS | Status: DC
  Filled 2018-04-22: qty 118

## 2018-04-22 MED ORDER — PROPOFOL 10 MG/ML IV BOLUS
INTRAVENOUS | Status: DC | PRN
  Administered 2018-04-22: 200 mg via INTRAVENOUS

## 2018-04-22 MED ORDER — BUPROPION HCL ER (XL) 150 MG PO TB24
150.0000 mg | ORAL_TABLET | Freq: Every day | ORAL | Status: DC
  Administered 2018-04-23: 150 mg via ORAL
  Filled 2018-04-22: qty 1

## 2018-04-22 MED ORDER — SODIUM CHLORIDE 0.9% FLUSH
3.0000 mL | Freq: Two times a day (BID) | INTRAVENOUS | Status: DC
  Administered 2018-04-22: 3 mL via INTRAVENOUS

## 2018-04-22 MED ORDER — 0.9 % SODIUM CHLORIDE (POUR BTL) OPTIME
TOPICAL | Status: DC | PRN
  Administered 2018-04-22: 1000 mL

## 2018-04-22 MED ORDER — PROPOFOL 10 MG/ML IV BOLUS
INTRAVENOUS | Status: AC
  Filled 2018-04-22: qty 20

## 2018-04-22 MED ORDER — MENTHOL 3 MG MT LOZG: 1.0000 | LOZENGE | OROMUCOSAL | Status: DC | PRN

## 2018-04-22 MED ORDER — ALUM & MAG HYDROXIDE-SIMETH 200-200-20 MG/5ML PO SUSP: 30.0000 mL | Freq: Four times a day (QID) | ORAL | Status: DC | PRN

## 2018-04-22 MED ORDER — ONDANSETRON HCL 4 MG/2ML IJ SOLN
INTRAMUSCULAR | Status: AC
  Filled 2018-04-22: qty 2

## 2018-04-22 MED ORDER — ZOLPIDEM TARTRATE 5 MG PO TABS: 5.0000 mg | ORAL_TABLET | Freq: Every evening | ORAL | Status: DC | PRN

## 2018-04-22 MED ORDER — LACTATED RINGERS IV SOLN: INTRAVENOUS | Status: DC

## 2018-04-22 MED ORDER — CHLORHEXIDINE GLUCONATE 4 % EX LIQD: 60.0000 mL | Freq: Once | CUTANEOUS | Status: DC

## 2018-04-22 MED ORDER — CLONAZEPAM 0.5 MG PO TABS
0.5000 mg | ORAL_TABLET | Freq: Two times a day (BID) | ORAL | Status: DC | PRN
  Administered 2018-04-23: 0.5 mg via ORAL
  Filled 2018-04-22: qty 1

## 2018-04-22 MED ORDER — SUGAMMADEX SODIUM 200 MG/2ML IV SOLN
INTRAVENOUS | Status: DC | PRN
  Administered 2018-04-22: 300 mg via INTRAVENOUS

## 2018-04-22 MED ORDER — DOCUSATE SODIUM 100 MG PO CAPS
100.0000 mg | ORAL_CAPSULE | Freq: Two times a day (BID) | ORAL | Status: DC
  Administered 2018-04-22 – 2018-04-23 (×2): 100 mg via ORAL
  Filled 2018-04-22 (×2): qty 1

## 2018-04-22 MED ORDER — ONDANSETRON HCL 4 MG/2ML IJ SOLN: 4.0000 mg | Freq: Four times a day (QID) | INTRAMUSCULAR | Status: DC | PRN

## 2018-04-22 MED ORDER — TOPIRAMATE 100 MG PO TABS
200.0000 mg | ORAL_TABLET | Freq: Two times a day (BID) | ORAL | Status: DC
  Administered 2018-04-22 – 2018-04-23 (×2): 200 mg via ORAL
  Filled 2018-04-22 (×3): qty 2

## 2018-04-22 MED ORDER — CEFAZOLIN SODIUM-DEXTROSE 2-4 GM/100ML-% IV SOLN
INTRAVENOUS | Status: AC
  Filled 2018-04-22: qty 100

## 2018-04-22 MED ORDER — PROMETHAZINE HCL 25 MG/ML IJ SOLN: 6.2500 mg | INTRAMUSCULAR | Status: DC | PRN

## 2018-04-22 MED ORDER — PHENYLEPHRINE 40 MCG/ML (10ML) SYRINGE FOR IV PUSH (FOR BLOOD PRESSURE SUPPORT)
PREFILLED_SYRINGE | INTRAVENOUS | Status: DC | PRN
  Administered 2018-04-22: 80 ug via INTRAVENOUS

## 2018-04-22 MED ORDER — FERROUS SULFATE 325 (65 FE) MG PO TABS
325.0000 mg | ORAL_TABLET | Freq: Two times a day (BID) | ORAL | Status: DC
  Administered 2018-04-22 – 2018-04-23 (×2): 325 mg via ORAL
  Filled 2018-04-22 (×2): qty 1

## 2018-04-22 MED ORDER — SENNOSIDES-DOCUSATE SODIUM 8.6-50 MG PO TABS: 1.0000 | ORAL_TABLET | Freq: Every evening | ORAL | Status: DC | PRN

## 2018-04-22 MED ORDER — FENTANYL CITRATE (PF) 250 MCG/5ML IJ SOLN
INTRAMUSCULAR | Status: AC
  Filled 2018-04-22: qty 5

## 2018-04-22 MED ORDER — LIDOCAINE 2% (20 MG/ML) 5 ML SYRINGE
INTRAMUSCULAR | Status: AC
  Filled 2018-04-22: qty 5

## 2018-04-22 SURGICAL SUPPLY — 80 items
BENZOIN TINCTURE PRP APPL 2/3 (GAUZE/BANDAGES/DRESSINGS) ×2 IMPLANT
BIT DRILL NEURO 2X3.1 SFT TUCH (MISCELLANEOUS) ×1 IMPLANT
BIT DRILL SRG 14X2.2XFLT CHK (BIT) ×1 IMPLANT
BIT DRL SRG 14X2.2XFLT CHK (BIT) ×1
BLADE CLIPPER SURG (BLADE) IMPLANT
BLADE SURG 15 STRL LF DISP TIS (BLADE) ×1 IMPLANT
BLADE SURG 15 STRL SS (BLADE) ×1
BONE VIVIGEN FORMABLE 1.3CC (Bone Implant) ×4 IMPLANT
BUR MATCHSTICK NEURO 3.0 LAGG (BURR) IMPLANT
CARTRIDGE OIL MAESTRO DRILL (MISCELLANEOUS) ×1 IMPLANT
CLSR STERI-STRIP ANTIMIC 1/2X4 (GAUZE/BANDAGES/DRESSINGS) ×2 IMPLANT
COLLAR CERV LO CONTOUR FIRM DE (SOFTGOODS) IMPLANT
CORDS BIPOLAR (ELECTRODE) ×2 IMPLANT
COVER SURGICAL LIGHT HANDLE (MISCELLANEOUS) IMPLANT
CRADLE DONUT ADULT HEAD (MISCELLANEOUS) ×2 IMPLANT
DECANTER SPIKE VIAL GLASS SM (MISCELLANEOUS) ×2 IMPLANT
DIFFUSER DRILL AIR PNEUMATIC (MISCELLANEOUS) ×2 IMPLANT
DRAIN JACKSON RD 7FR 3/32 (WOUND CARE) IMPLANT
DRAPE C-ARM 42X72 X-RAY (DRAPES) ×4 IMPLANT
DRAPE POUCH INSTRU U-SHP 10X18 (DRAPES) ×2 IMPLANT
DRAPE SURG 17X23 STRL (DRAPES) ×8 IMPLANT
DRILL BIT SKYLINE 14MM (BIT) ×1
DRILL NEURO 2X3.1 SOFT TOUCH (MISCELLANEOUS) ×2
DURAPREP 26ML APPLICATOR (WOUND CARE) ×2 IMPLANT
ELECT COATED BLADE 2.86 ST (ELECTRODE) ×2 IMPLANT
ELECT REM PT RETURN 9FT ADLT (ELECTROSURGICAL) ×2
ELECTRODE REM PT RTRN 9FT ADLT (ELECTROSURGICAL) ×1 IMPLANT
EVACUATOR SILICONE 100CC (DRAIN) IMPLANT
GAUZE SPONGE 4X4 12PLY STRL (GAUZE/BANDAGES/DRESSINGS) ×2 IMPLANT
GAUZE SPONGE 4X4 12PLY STRL LF (GAUZE/BANDAGES/DRESSINGS) ×2 IMPLANT
GAUZE SPONGE 4X4 16PLY XRAY LF (GAUZE/BANDAGES/DRESSINGS) ×2 IMPLANT
GLOVE BIO SURGEON STRL SZ7 (GLOVE) ×2 IMPLANT
GLOVE BIO SURGEON STRL SZ8 (GLOVE) ×2 IMPLANT
GLOVE BIOGEL PI IND STRL 6.5 (GLOVE) ×4 IMPLANT
GLOVE BIOGEL PI IND STRL 7.0 (GLOVE) ×2 IMPLANT
GLOVE BIOGEL PI IND STRL 8 (GLOVE) ×1 IMPLANT
GLOVE BIOGEL PI INDICATOR 6.5 (GLOVE) ×4
GLOVE BIOGEL PI INDICATOR 7.0 (GLOVE) ×2
GLOVE BIOGEL PI INDICATOR 8 (GLOVE) ×1
GLOVE SURG SS PI 6.0 STRL IVOR (GLOVE) ×6 IMPLANT
GLOVE SURG SS PI 6.5 STRL IVOR (GLOVE) ×10 IMPLANT
GOWN STRL REUS W/ TWL LRG LVL3 (GOWN DISPOSABLE) ×4 IMPLANT
GOWN STRL REUS W/ TWL XL LVL3 (GOWN DISPOSABLE) ×1 IMPLANT
GOWN STRL REUS W/TWL LRG LVL3 (GOWN DISPOSABLE) ×4
GOWN STRL REUS W/TWL XL LVL3 (GOWN DISPOSABLE) ×1
INTERLOCK LRDTC CRVCL VBR 7MM (Bone Implant) ×2 IMPLANT
IV CATH 14GX2 1/4 (CATHETERS) ×2 IMPLANT
KIT BASIN OR (CUSTOM PROCEDURE TRAY) ×2 IMPLANT
KIT TURNOVER KIT B (KITS) ×2 IMPLANT
LORDOTIC CERVICAL VBR 7MM SM (Bone Implant) ×4 IMPLANT
MANIFOLD NEPTUNE II (INSTRUMENTS) IMPLANT
NEEDLE PRECISIONGLIDE 27X1.5 (NEEDLE) ×2 IMPLANT
NEEDLE SPNL 20GX3.5 QUINCKE YW (NEEDLE) ×2 IMPLANT
NS IRRIG 1000ML POUR BTL (IV SOLUTION) ×2 IMPLANT
OIL CARTRIDGE MAESTRO DRILL (MISCELLANEOUS) ×2
PACK ORTHO CERVICAL (CUSTOM PROCEDURE TRAY) ×2 IMPLANT
PAD ARMBOARD 7.5X6 YLW CONV (MISCELLANEOUS) ×6 IMPLANT
PATTIES SURGICAL .5 X.5 (GAUZE/BANDAGES/DRESSINGS) IMPLANT
PATTIES SURGICAL .5 X1 (DISPOSABLE) ×2 IMPLANT
PIN DISTRACTION 14 (PIN) ×4 IMPLANT
PLATE TWO LEVEL SKYLINE 30MM (Plate) ×2 IMPLANT
SCREW SKYLINE VAR OS 14MM (Screw) ×12 IMPLANT
SPONGE INTESTINAL PEANUT (DISPOSABLE) ×4 IMPLANT
SPONGE SURGIFOAM ABS GEL 100 (HEMOSTASIS) ×2 IMPLANT
STRIP CLOSURE SKIN 1/2X4 (GAUZE/BANDAGES/DRESSINGS) ×2 IMPLANT
SURGIFLO W/THROMBIN 8M KIT (HEMOSTASIS) IMPLANT
SUT MNCRL AB 4-0 PS2 18 (SUTURE) ×2 IMPLANT
SUT SILK 2 0 TIES 10X30 (SUTURE) ×2 IMPLANT
SUT SILK 4 0 (SUTURE)
SUT SILK 4-0 18XBRD TIE 12 (SUTURE) IMPLANT
SUT VIC AB 2-0 CT2 18 VCP726D (SUTURE) ×2 IMPLANT
SYR BULB IRRIGATION 50ML (SYRINGE) ×2 IMPLANT
SYR CONTROL 10ML LL (SYRINGE) ×6 IMPLANT
TAPE CLOTH 4X10 WHT NS (GAUZE/BANDAGES/DRESSINGS) ×2 IMPLANT
TAPE CLOTH SURG 4X10 WHT LF (GAUZE/BANDAGES/DRESSINGS) ×2 IMPLANT
TAPE UMBILICAL COTTON 1/8X30 (MISCELLANEOUS) ×4 IMPLANT
TOWEL OR 17X24 6PK STRL BLUE (TOWEL DISPOSABLE) ×2 IMPLANT
TOWEL OR 17X26 10 PK STRL BLUE (TOWEL DISPOSABLE) ×2 IMPLANT
WATER STERILE IRR 1000ML POUR (IV SOLUTION) ×2 IMPLANT
YANKAUER SUCT BULB TIP NO VENT (SUCTIONS) ×2 IMPLANT

## 2018-04-22 NOTE — Op Note (Signed)
NAME:  Erika Hanson            MEDICAL RECORD NO.: [id number]   PHYSICIAN:  Phylliss Bob, MD      DATE OF BIRTH:  January 10, 1966   DATE OF PROCEDURE:  04/22/2018                               OPERATIVE REPORT     PREOPERATIVE DIAGNOSES: 1. Cervical Myelopathy 2. Spinal cord compression C5-6, C6-7   POSTOPERATIVE DIAGNOSES: 1. Cervical Myelopathy 2. Spinal cord compression C5-6, C6-7   PROCEDURE: 1. Anterior cervical decompression and fusion C5-6, C6-7. 2. Placement of anterior instrumentation, C5-C7. 3. Insertion of interbody device x2 (30mm Titan intervertebral spacers). 4. Intraoperative use of fluoroscopy. 5. Use of morselized allograft - ViviGen.   SURGEON:  Phylliss Bob, MD   ASSISTANT:  Pricilla Holm, PA-C.   ANESTHESIA:  General endotracheal anesthesia.   COMPLICATIONS:  None.   DISPOSITION:  Stable.   ESTIMATED BLOOD LOSS:  Minimal.   INDICATIONS FOR SURGERY:  Briefly, Erika Hanson is a pleasant 52 year old female, who did present to me with progressive cervical myelopathy.   The patient's MRI did reveal the findings noted above.  Given the patient's ongoing and progressive symptoms, we did discuss proceeding with the procedure noted above.  The patient was fully aware of the risks and limitations of surgery as outlined in my preoperative note.   OPERATIVE DETAILS:  On 04/22/2018, the patient was brought to surgery and general endotracheal anesthesia was administered.  The patient was placed supine on the hospital bed. The neck was gently extended.  All bony prominences were meticulously padded.  The neck was prepped and draped in the usual sterile fashion.  At this point, I did make a left-sided transverse incision.  The platysma was incised.  A More-Robinson approach was used and the anterior spine was identified. A self-retaining retractor was placed.  I then subperiosteally exposed the vertebral bodies from C5-C7.  Caspar pins were then placed into the C6  and C7 vertebral bodies and distraction was applied.  A thorough and complete C6-7 intervertebral diskectomy was performed.  The posterior longitudinal ligament was identified and entered using a nerve hook.  I then used #1 followed by #2 Kerrison to perform a thorough and complete intervertebral diskectomy.  The spinal cord was thoroughly decompressed.  The endplates were then prepared and the appropriate-sized intervertebral spacer was then packed with ViviGen and tamped into position in the usual fashion.  The lower Caspar pin was then removed and placed into the C5 vertebral body and once again, distraction was applied across the C5-6 intervertebral space.  I then again performed a thorough and complete diskectomy, thoroughly decompressing the spinal canal cord.  After preparing the endplates, the appropriate-sized intervertebral spacer was packed with ViviGen and tamped into position.  The Caspar pins then were removed and bone wax was placed in their place.  The appropriate-sized anterior cervical plate was placed over the anterior spine.  14 mm variable angle screws were placed, 2 in each vertebral body from C5-C7 for a total of 6 vertebral body screws.  The screws were then locked to the plate using the Cam locking mechanism.  I was very pleased with the final fluoroscopic images.  The wound was then irrigated.  The wound was then explored for any undue bleeding and there was no bleeding noted. The wound was then closed in layers using 2-0  Vicryl, followed by 4-0 Monocryl.  Benzoin and Steri-Strips were applied, followed by sterile dressing.  All instrument counts were correct at the termination of the procedure.   Of note, Pricilla Holm, PA-C, was my assistant throughout surgery, and did aid in retraction, suctioning, and closure from start to finish.         Phylliss Bob, MD

## 2018-04-22 NOTE — Transfer of Care (Signed)
Immediate Anesthesia Transfer of Care Note  Patient: Erika Hanson  Procedure(s) Performed: ANTERIOR CERVICAL DECOMPRESSION FUSION, CERVICAL FIVE-SIX, CERVICAL SIX-SEVEN WITH INSTRUMENTATION AND ALLOGRAFT (N/A Spine Cervical)  Patient Location: PACU  Anesthesia Type:General  Level of Consciousness: awake, alert  and oriented  Airway & Oxygen Therapy: Patient Spontanous Breathing  Post-op Assessment: Report given to RN, Post -op Vital signs reviewed and stable and Patient moving all extremities X 4  Post vital signs: Reviewed and stable  Last Vitals:  Vitals Value Taken Time  BP 138/63 04/22/2018  2:01 PM  Temp    Pulse 79 04/22/2018  2:01 PM  Resp 18 04/22/2018  2:01 PM  SpO2 98 % 04/22/2018  2:01 PM  Vitals shown include unvalidated device data.  Last Pain:  Vitals:   04/22/18 0938  TempSrc:   PainSc: 3       Patients Stated Pain Goal: 3 (28/41/32 4401)  Complications: No apparent anesthesia complications

## 2018-04-22 NOTE — Anesthesia Postprocedure Evaluation (Signed)
Anesthesia Post Note  Patient: Erika Hanson  Procedure(s) Performed: ANTERIOR CERVICAL DECOMPRESSION FUSION, CERVICAL FIVE-SIX, CERVICAL SIX-SEVEN WITH INSTRUMENTATION AND ALLOGRAFT (N/A Spine Cervical)     Patient location during evaluation: PACU Anesthesia Type: General Level of consciousness: awake and alert Pain management: pain level controlled Vital Signs Assessment: post-procedure vital signs reviewed and stable Respiratory status: spontaneous breathing, nonlabored ventilation, respiratory function stable and patient connected to nasal cannula oxygen Cardiovascular status: blood pressure returned to baseline and stable Postop Assessment: no apparent nausea or vomiting Anesthetic complications: no    Last Vitals:  Vitals:   04/22/18 1445 04/22/18 1522  BP: 122/62 131/72  Pulse: 72 67  Resp: 11 18  Temp: (!) 36.3 C (!) 36.4 C  SpO2: 95% 98%    Last Pain:  Vitals:   04/22/18 1522  TempSrc: Oral  PainSc:     LLE Motor Response: Purposeful movement;Responds to commands (04/22/18 1530) LLE Sensation: Full sensation (04/22/18 1530) RLE Motor Response: Purposeful movement;Responds to commands (04/22/18 1530) RLE Sensation: Full sensation (04/22/18 1530)      Tiajuana Amass

## 2018-04-22 NOTE — Anesthesia Procedure Notes (Signed)
Procedure Name: Intubation Date/Time: 04/22/2018 11:03 AM Performed by: Harden Mo, CRNA Pre-anesthesia Checklist: Patient identified, Emergency Drugs available, Suction available and Patient being monitored Patient Re-evaluated:Patient Re-evaluated prior to induction Oxygen Delivery Method: Circle System Utilized Preoxygenation: Pre-oxygenation with 100% oxygen Induction Type: IV induction Ventilation: Mask ventilation without difficulty and Oral airway inserted - appropriate to patient size Laryngoscope Size: Glidescope and 3 Grade View: Grade I Tube type: Oral Tube size: 7.5 mm Number of attempts: 1 Airway Equipment and Method: Stylet and Oral airway Placement Confirmation: ETT inserted through vocal cords under direct vision,  positive ETCO2 and breath sounds checked- equal and bilateral Secured at: 22 cm Tube secured with: Tape Dental Injury: Teeth and Oropharynx as per pre-operative assessment

## 2018-04-22 NOTE — H&P (Signed)
PREOPERATIVE H&P  Chief Complaint: Balance deterioration  HPI: Erika Hanson is a 52 y.o. female who presents with ongoing balance deterioration.   MRI reveals spinal cord compression at C5/6 and C6/7  Patient has failed multiple forms of conservative care and continues to have pain (see office notes for additional details regarding the patient's full course of treatment)  Past Medical History:  Diagnosis Date  . Acid reflux   . Anemia, iron deficiency 09/22/2017  . Anxiety   . Arthritis   . Constipation   . Depression   . Fibroid 03/19/2018  . H/O Bell's palsy    right in 2003   . Hypertension   . Insomnia   . Morbid obesity ([place])   . Muscle spasm   . Nocturia   . Numbness in feet   . Weakness    Past Surgical History:  Procedure Laterality Date  . CESAREAN SECTION     Social History   Socioeconomic History  . Marital status: Divorced    Spouse name: Not on file  . Number of children: 1  . Years of education: HS  . Highest education level: High school graduate  Occupational History  . 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 Financial resource strain: Not hard at all  . Food insecurity:    Worry: Never true    Inability: Never true  . Transportation needs:    Medical: No    Non-medical: No  Tobacco Use  . Smoking status: Never Smoker  . Smokeless tobacco: Never Used  Substance and Sexual Activity  . Alcohol use: No  . Drug use: No  . Sexual activity: Yes    Birth control/protection: None  Lifestyle  . Physical activity:    Days per week: 0 days    Minutes per session: 0 min  . Stress: Very much  Relationships  . Social connections:    Talks on phone: Three times a week    Gets together: Three times a week    Attends religious service: Never    Active member of club or organization: No    Attends meetings of clubs or organizations: Never    Relationship status: Divorced  Other Topics Concern  . Not on  file  Social History Narrative   Lives at home with her son.   Right-handed.   8 cups tea and 1 can of [facility] per day.   As of 03/19/18 pt has disability pending and not working    Family History  Problem Relation Age of Onset  . Hypertension Mother   . Depression Mother   . Alzheimer's disease Father   . Diabetes Father   . Hypertension Father   . Drug abuse Brother   . Anxiety disorder Brother   . Depression Brother   . Breast cancer Neg Hx    No Known Allergies Prior to Admission medications   Medication Sig Start Date End Date Taking? Authorizing Provider  Biotin 10000 MCG TABS Take 2 tablets by mouth.   Yes [provider]  buPROPion (WELLBUTRIN XL) 150 MG 24 hr tablet Take 1 tablet (150 mg total) by mouth daily. 03/19/18  Yes [place], Ria Clock, MD  busPIRone (BUSPAR) 15 MG tablet Take 1 tablet (15 mg total) by mouth 3 (three) times daily. 03/19/18  Yes Ursula Alert, MD  celecoxib (CELEBREX) 100 MG capsule Take 1 capsule (100 mg total) by mouth 2 (two) times daily. 12/30/17  Yes [place], Modena Nunnery, MD  Cholecalciferol 50000 units capsule Take 1 capsule (50,000 Units total) by mouth once a week. 03/19/18  Yes McLean-Scocuzza, [name], MD  clonazePAM (KLONOPIN) 0.5 MG tablet TAKE 1 TABLET BY MOUTH TWICE DAILY AS NEEDED FOR ANXIETY 03/09/18  Yes [place], Modena Nunnery, MD  DULoxetine (CYMBALTA) 30 MG capsule Total of  90mg  daily 04/06/18  Yes McLean-Scocuzza, [name], MD  DULoxetine (CYMBALTA) 60 MG capsule Take 90 mg daily (60 mg + 30 mg) 04/06/18  Yes McLean-Scocuzza, [name], MD  ferrous sulfate 325 (65 FE) MG tablet Take 1 tablet (325 mg total) 2 (two) times daily with a meal by mouth. 10/02/17  Yes [place], Modena Nunnery, MD  hydrochlorothiazide (HYDRODIURIL) 25 MG tablet TAKE 1 TABLET BY MOUTH ONCE A DAY 12/16/17  Yes [place], Modena Nunnery, MD  lamoTRIgine (LAMICTAL) 25 MG tablet Take 2 tablets (50 mg total) by mouth daily. 04/16/18  Yes Ursula Alert, MD  linaclotide (LINZESS) 290  MCG CAPS capsule Take 1 capsule (290 mcg total) by mouth daily before breakfast. 12/25/17  Yes [place], Modena Nunnery, MD  losartan (COZAAR) 50 MG tablet TAKE 1 TABLET BY MOUTH ONCE A DAY FOR HIGH BLOOD PRESSURE. 03/19/18  Yes , Modena Nunnery, MD  omeprazole (PRILOSEC) 20 MG capsule Take 20 mg by mouth at bedtime.   Yes [provider]  tiZANidine (ZANAFLEX) 4 MG tablet START WITH 1/2 TABLET BY MOUTH (2 MG) DAILY AND INCREASE TO 1 TABLET (4 MG) THREE TIMES DAILY AS TOLERATED FOR MUSCLE SPASTICITY. 03/19/18  Yes Susy Frizzle, MD  topiramate (TOPAMAX) 200 MG tablet Take 1 tablet (200 mg total) by mouth 2 (two) times daily. 12/02/17  Yes , Modena Nunnery, MD  traZODone (DESYREL) 50 MG tablet Take 1-2 tablets (50-100 mg total) by mouth at bedtime as needed. for sleep 03/19/18  Yes Ursula Alert, MD     All other systems have been reviewed and were otherwise negative with the exception of those mentioned in the HPI and as above.  Physical Exam: There were no vitals filed for this visit.  There is no height or weight on file to calculate BMI.  General: Alert, no acute distress Cardiovascular: No pedal edema Respiratory: No cyanosis, no use of accessory musculature Skin: No lesions in the area of chief complaint Neurologic: Sensation intact distally Psychiatric: Patient is competent for consent with normal mood and affect Lymphatic: No axillary or cervical lymphadenopathy  MUSCULOSKELETAL: + hoffman's sign bilaterally  Assessment/Plan: CERVICAL MYELOPATHY  Plan for Procedure(s): ANTERIOR CERVICAL DECOMPRESSION FUSION, CERVICAL 5-6, CERVICAL 6-7 WITH INSTRUMENTATION AND ALLOGRAFT   Sinclair Ship, MD 04/22/2018 7:28 AM

## 2018-04-22 NOTE — Anesthesia Preprocedure Evaluation (Addendum)
Anesthesia Evaluation  Patient identified by MRN, date of birth, ID band Patient awake    Reviewed: Allergy & Precautions, NPO status , Patient's Chart, lab work & pertinent test results  Airway Mallampati: III  TM Distance: >3 FB Neck ROM: Limited    Dental  (+) Dental Advisory Given, Teeth Intact   Pulmonary neg pulmonary ROS,    breath sounds clear to auscultation       Cardiovascular hypertension, Pt. on medications  Rhythm:Regular Rate:Normal     Neuro/Psych Anxiety Depression  Neuromuscular disease    GI/Hepatic Neg liver ROS, GERD  Medicated,  Endo/Other  negative endocrine ROS  Renal/GU negative Renal ROS     Musculoskeletal  (+) Arthritis ,   Abdominal   Peds  Hematology negative hematology ROS (+)   Anesthesia Other Findings   Reproductive/Obstetrics                           Lab Results  Component Value Date   WBC 6.7 12/30/2017   HGB 12.9 12/30/2017   HCT 39.1 12/30/2017   MCV 80.6 12/30/2017   PLT 262 12/30/2017   Lab Results  Component Value Date   CREATININE 1.17 (H) 12/30/2017   BUN 24 12/30/2017   NA 141 12/30/2017   K 4.0 12/30/2017   CL 107 12/30/2017   CO2 24 12/30/2017    Anesthesia Physical Anesthesia Plan  ASA: III  Anesthesia Plan: General   Post-op Pain Management:    Induction: Intravenous  PONV Risk Score and Plan: 3 and Dexamethasone, Ondansetron, Treatment may vary due to age or medical condition and Midazolam  Airway Management Planned: Oral ETT  Additional Equipment:   Intra-op Plan:   Post-operative Plan: Extubation in OR  Informed Consent: I have reviewed the patients History and Physical, chart, labs and discussed the procedure including the risks, benefits and alternatives for the proposed anesthesia with the patient or authorized representative who has indicated his/her understanding and acceptance.   Dental advisory  given  Plan Discussed with: CRNA  Anesthesia Plan Comments:        Anesthesia Quick Evaluation

## 2018-04-23 ENCOUNTER — Ambulatory Visit: Payer: BLUE CROSS/BLUE SHIELD | Admitting: Internal Medicine

## 2018-04-23 ENCOUNTER — Ambulatory Visit: Payer: BLUE CROSS/BLUE SHIELD | Admitting: Licensed Clinical Social Worker

## 2018-04-23 DIAGNOSIS — G9589 Other specified diseases of spinal cord: Secondary | ICD-10-CM | POA: Diagnosis not present

## 2018-04-23 MED ORDER — SODIUM CHLORIDE 0.9 % IV SOLN: INTRAVENOUS | Status: DC

## 2018-04-23 MED ORDER — OXYCODONE-ACETAMINOPHEN 5-325 MG PO TABS: 1.0000 | ORAL_TABLET | ORAL | 0 refills | Status: DC | PRN

## 2018-04-23 MED FILL — Thrombin For Soln 20000 Unit: CUTANEOUS | Qty: 1 | Status: AC

## 2018-04-23 NOTE — Progress Notes (Signed)
Patient is discharged from room 3C03 at this time. Alert and in stable condition, IV site d/c'd and instructions read to patient and mother with understanding verbalized. Left unit via wheelchair with all belongings at side.

## 2018-04-23 NOTE — Progress Notes (Signed)
   Patient doing well  Has been tolerating PO  BP (!) 112/36 (BP Location: Right Arm)   Pulse 82   Temp 98.5 F (36.9 C) (Oral)   Resp 20   Ht 5\' 4"  (1.626 m)   Wt (!) 334 lb (151.5 kg)   LMP 05/21/2016   SpO2 92%   BMI 57.33 kg/m    Neck soft/supple Dressing in place NVI  POD #1 s/p ACDF, doing well  - encourage ambulation - Percocet for pain, Valium for muscle spasms - likely d/c home today with f/u in 2 weeks

## 2018-04-28 ENCOUNTER — Encounter (HOSPITAL_COMMUNITY): Payer: Self-pay | Admitting: Orthopedic Surgery

## 2018-05-01 ENCOUNTER — Encounter: Payer: Self-pay | Admitting: Internal Medicine

## 2018-05-01 ENCOUNTER — Other Ambulatory Visit: Payer: Self-pay | Admitting: Internal Medicine

## 2018-05-01 DIAGNOSIS — M62838 Other muscle spasm: Secondary | ICD-10-CM

## 2018-05-01 MED ORDER — TIZANIDINE HCL 4 MG PO TABS: 4.0000 mg | ORAL_TABLET | Freq: Three times a day (TID) | ORAL | 2 refills | Status: DC | PRN

## 2018-05-01 NOTE — Progress Notes (Signed)
Mammogram in chart

## 2018-05-01 NOTE — Progress Notes (Signed)
No immunizations found in [place]

## 2018-05-06 NOTE — Discharge Summary (Signed)
Patient ID: Erika Hanson MRN: [id number] DOB/AGE: Feb 09, 1966 52 y.o.  Admit date: 04/22/2018 Discharge date: 04/23/2018  Admission Diagnoses:  Active Problems:   Radiculopathy   Discharge Diagnoses:  Same  Past Medical History:  Diagnosis Date  . Acid reflux   . Anemia, iron deficiency 09/22/2017  . Anxiety   . Arthritis   . Constipation   . Depression   . Fibroid 03/19/2018  . H/O Bell's palsy    right in 2003   . Hypertension   . Insomnia   . Morbid obesity ([place])   . Muscle spasm   . Nocturia   . Numbness in feet   . Weakness     Surgeries: Procedure(s): ANTERIOR CERVICAL DECOMPRESSION FUSION, CERVICAL FIVE-SIX, CERVICAL SIX-SEVEN WITH INSTRUMENTATION AND ALLOGRAFT on 04/22/2018   Consultants: None  Discharged Condition: Improved  Hospital Course: Erika DECELLES is an 52 y.o. female who was admitted 04/22/2018 for operative treatment of myeloradiculopathy. Patient has severe unremitting pain that affects sleep, daily activities, and work/hobbies. After pre-op clearance the patient was taken to the operating room on 04/22/2018 and underwent  Procedure(s): ANTERIOR CERVICAL DECOMPRESSION FUSION, CERVICAL FIVE-SIX, CERVICAL SIX-SEVEN WITH INSTRUMENTATION AND ALLOGRAFT.    Patient was given perioperative antibiotics:  Anti-infectives (From admission, onward)   Start     Dose/Rate Route Frequency Ordered Stop   04/22/18 2000  ceFAZolin (ANCEF) IVPB 2g/100 mL premix     2 g 200 mL/hr over 30 Minutes Intravenous Every 8 hours 04/22/18 1527 04/23/18 0458   04/22/18 0915  ceFAZolin (ANCEF) IVPB 2g/100 mL premix  Status:  Discontinued     2 g 200 mL/hr over 30 Minutes Intravenous On call to O.R. 04/22/18 2831 04/22/18 0906   04/22/18 0915  ceFAZolin (ANCEF) 3 g in dextrose 5 % 50 mL IVPB     3 g 100 mL/hr over 30 Minutes Intravenous To [facility]Stay [facility] 04/22/18 0906 04/22/18 1115   04/22/18 0901  ceFAZolin (ANCEF) 2-4 GM/100ML-% IVPB  Status:  Discontinued      Note to Pharmacy:  Maryjean Ka   : cabinet override      04/22/18 0901 04/22/18 0906       Patient was given sequential compression devices, early ambulation to prevent DVT.  Patient benefited maximally from hospital stay and there were no complications.    Recent vital signs: BP 132/75 (BP Location: Right Arm)   Pulse 72   Temp 98.5 F (36.9 C) (Oral)   Resp 18   Ht 5\' 4"  (1.626 m)   Wt (!) 151.5 kg (334 lb)   LMP 05/21/2016   SpO2 96%   BMI 57.33 kg/m     Discharge Medications:   Allergies as of 04/23/2018   No Known Allergies     Medication List    TAKE these medications   Biotin 10000 MCG Tabs Take 2 tablets by mouth.   buPROPion 150 MG 24 hr tablet Commonly known as:  WELLBUTRIN XL Take 1 tablet (150 mg total) by mouth daily.   busPIRone 15 MG tablet Commonly known as:  BUSPAR Take 1 tablet (15 mg total) by mouth 3 (three) times daily.   Cholecalciferol 50000 units capsule Take 1 capsule (50,000 Units total) by mouth once a week.   clonazePAM 0.5 MG tablet Commonly known as:  KLONOPIN TAKE 1 TABLET BY MOUTH TWICE DAILY AS NEEDED FOR ANXIETY   DULoxetine 30 MG capsule Commonly known as:  CYMBALTA Total of  90mg  daily   DULoxetine 60  MG capsule Commonly known as:  CYMBALTA Take 90 mg daily (60 mg + 30 mg)   ferrous sulfate 325 (65 FE) MG tablet Take 1 tablet (325 mg total) 2 (two) times daily with a meal by mouth.   hydrochlorothiazide 25 MG tablet Commonly known as:  HYDRODIURIL TAKE 1 TABLET BY MOUTH ONCE A DAY   lamoTRIgine 25 MG tablet Commonly known as:  LAMICTAL Take 2 tablets (50 mg total) by mouth daily.   linaclotide 290 MCG Caps capsule Commonly known as:  LINZESS Take 1 capsule (290 mcg total) by mouth daily before breakfast.   losartan 50 MG tablet Commonly known as:  COZAAR TAKE 1 TABLET BY MOUTH ONCE A DAY FOR HIGH BLOOD PRESSURE.   omeprazole 20 MG capsule Commonly known as:  PRILOSEC Take 20 mg by mouth at bedtime.    oxyCODONE-acetaminophen 5-325 MG tablet Commonly known as:  PERCOCET/ROXICET Take 1-2 tablets by mouth every 4 (four) hours as needed for moderate pain or severe pain.   topiramate 200 MG tablet Commonly known as:  TOPAMAX Take 1 tablet (200 mg total) by mouth 2 (two) times daily.   traZODone 50 MG tablet Commonly known as:  DESYREL Take 1-2 tablets (50-100 mg total) by mouth at bedtime as needed. for sleep       Diagnostic Studies: Dg Chest 2 View  Result Date: 04/22/2018 CLINICAL DATA:  Pre operative respiratory exam. EXAM: CHEST - 2 VIEW COMPARISON:  05/28/2016 FINDINGS: Pulmonary vascularity is normal and the lungs are clear. Heart size is within normal limits considering the AP technique. No acute bone abnormality. No effusions. IMPRESSION: No acute abnormality. Electronically Signed   By: Lorriane Shire M.D.   On: 04/22/2018 09:40   Dg Cervical Spine 1 View  Result Date: 04/22/2018 CLINICAL DATA:  52 year old female undergoing ACDF. EXAM: DG C-ARM 61-120 MIN; DG CERVICAL SPINE - 1 VIEW COMPARISON:  Cervical spine MRI 03/27/2018 FLUOROSCOPY TIME:  0 minutes 34 seconds FINDINGS: Lateral fluoroscopic spot image of the cervical spine demonstrates a 3 vertebral body and 2 disc space level ACDF. IMPRESSION: Cervical ACDF depicted. Electronically Signed   By: Genevie Ann M.D.   On: 04/22/2018 13:49   Dg Ankle 2 Views Left  Result Date: 04/17/2018 CLINICAL DATA:  Chronic left heel pain. EXAM: LEFT ANKLE - 2 VIEW COMPARISON:  None. FINDINGS: There is no evidence of fracture, dislocation, or joint effusion. There is no evidence of arthropathy. Moderate posterior calcaneal spurring is noted. Soft tissues are unremarkable. IMPRESSION: Moderate posterior calcaneal spurring. No acute abnormality seen in the left ankle. Electronically Signed   By: Marijo Conception, M.D.   On: 04/17/2018 17:08   Mr Forearm Left Wo Contrast  Result Date: 04/09/2018 CLINICAL DATA:  Soft tissue mass along the medial  posterior forearm. EXAM: MRI OF THE LEFT FOREARM WITHOUT CONTRAST TECHNIQUE: Multiplanar, multisequence MR imaging of the left forearm was performed. No intravenous contrast was administered. COMPARISON:  None. FINDINGS: Bones/Joint/Cartilage No fracture or dislocation. Normal alignment. No joint effusion. No aggressive osseous lesion. Ligaments, Muscles and Tendons Muscles are normal. No muscle atrophy or muscle edema. No intramuscular fluid collection or soft tissue mass. Biceps tendon insertion is intact. Soft tissue No fluid collection or hematoma. No soft tissue mass. Ill-defined area of thickened septae in the subcutaneous fat along the mid ulnar aspect of the forearm with mild overlying skin thickening. This may reflect a soft tissue contusion versus an area of fat necrosis. No definable soft tissue mass is  identified. IMPRESSION: Ill-defined area of thickened septae in the subcutaneous fat along the mid ulnar aspect of the forearm with mild overlying skin thickening. This may reflect a soft tissue contusion versus an area of fat necrosis. No definable soft tissue mass is identified. Electronically Signed   By: Kathreen Devoid   On: 04/09/2018 13:17   Dg C-arm 1-60 Min  Result Date: 04/22/2018 CLINICAL DATA:  52 year old female undergoing ACDF. EXAM: DG C-ARM 61-120 MIN; DG CERVICAL SPINE - 1 VIEW COMPARISON:  Cervical spine MRI 03/27/2018 FLUOROSCOPY TIME:  0 minutes 34 seconds FINDINGS: Lateral fluoroscopic spot image of the cervical spine demonstrates a 3 vertebral body and 2 disc space level ACDF. IMPRESSION: Cervical ACDF depicted. Electronically Signed   By: Genevie Ann M.D.   On: 04/22/2018 13:49   Mm 3d Screen Breast Bilateral  Result Date: 04/22/2018 CLINICAL DATA:  Screening. EXAM: DIGITAL SCREENING BILATERAL MAMMOGRAM WITH TOMO AND CAD COMPARISON:  Previous exam(s). ACR Breast Density Category b: There are scattered areas of fibroglandular density. FINDINGS: There are no findings suspicious for  malignancy. Images were processed with CAD. IMPRESSION: No mammographic evidence of malignancy. A result letter of this screening mammogram will be mailed directly to the patient. RECOMMENDATION: Screening mammogram in one year. (Code:SM-B-01Y) BI-RADS CATEGORY  1: Negative. Electronically Signed   By: Lillia Mountain M.D.   On: 04/22/2018 10:54    Disposition:    POD #1 s/p ACDF, doing well  - encourage ambulation - Percocet for pain, Valium for muscle spasms -Written scripts for pain signed and in chart -D/C instructions sheet printed and in chart -D/C today  -F/U in office 2 weeks   Signed: Justice Britain 05/06/2018, 12:32 PM

## 2018-05-11 ENCOUNTER — Other Ambulatory Visit: Payer: Self-pay | Admitting: Family Medicine

## 2018-05-14 ENCOUNTER — Ambulatory Visit (INDEPENDENT_AMBULATORY_CARE_PROVIDER_SITE_OTHER): Payer: BLUE CROSS/BLUE SHIELD | Admitting: Psychiatry

## 2018-05-14 ENCOUNTER — Encounter: Payer: Self-pay | Admitting: Psychiatry

## 2018-05-14 VITALS — BP 124/77 | HR 78 | Ht 64.0 in | Wt 325.8 lb

## 2018-05-14 DIAGNOSIS — F331 Major depressive disorder, recurrent, moderate: Secondary | ICD-10-CM | POA: Diagnosis not present

## 2018-05-14 DIAGNOSIS — F410 Panic disorder [episodic paroxysmal anxiety] without agoraphobia: Secondary | ICD-10-CM

## 2018-05-14 DIAGNOSIS — F411 Generalized anxiety disorder: Secondary | ICD-10-CM

## 2018-05-14 MED ORDER — LAMOTRIGINE 25 MG PO TABS: 75.0000 mg | ORAL_TABLET | Freq: Every day | ORAL | 0 refills | Status: DC
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
## 2018-05-15 ENCOUNTER — Encounter: Payer: Self-pay | Admitting: Psychiatry

## 2018-05-18 ENCOUNTER — Ambulatory Visit
Admission: RE | Admit: 2018-05-18 | Payer: BLUE CROSS/BLUE SHIELD | Source: Ambulatory Visit | Admitting: Gastroenterology

## 2018-05-18 ENCOUNTER — Encounter: Admission: RE | Payer: Self-pay | Source: Ambulatory Visit

## 2018-05-18 SURGERY — COLONOSCOPY WITH PROPOFOL
Anesthesia: General

## 2018-05-25 ENCOUNTER — Other Ambulatory Visit: Payer: Self-pay

## 2018-05-25 ENCOUNTER — Encounter: Payer: Self-pay | Admitting: Psychiatry

## 2018-05-25 ENCOUNTER — Ambulatory Visit (INDEPENDENT_AMBULATORY_CARE_PROVIDER_SITE_OTHER): Payer: BLUE CROSS/BLUE SHIELD | Admitting: Psychiatry

## 2018-05-25 VITALS — BP 145/78 | HR 70 | Temp 97.5°F | Wt 319.8 lb

## 2018-05-25 DIAGNOSIS — F410 Panic disorder [episodic paroxysmal anxiety] without agoraphobia: Secondary | ICD-10-CM

## 2018-05-25 DIAGNOSIS — F331 Major depressive disorder, recurrent, moderate: Secondary | ICD-10-CM

## 2018-05-25 DIAGNOSIS — F411 Generalized anxiety disorder: Secondary | ICD-10-CM | POA: Diagnosis not present

## 2018-05-25 MED ORDER — LAMOTRIGINE 100 MG PO TABS
100.0000 mg | ORAL_TABLET | Freq: Every day | ORAL | 1 refills | Status: DC
Start: 2018-05-25 — End: 2018-09-07
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
## 2018-05-26 ENCOUNTER — Encounter: Payer: Self-pay | Admitting: General Surgery

## 2018-05-26 ENCOUNTER — Ambulatory Visit (INDEPENDENT_AMBULATORY_CARE_PROVIDER_SITE_OTHER): Payer: BLUE CROSS/BLUE SHIELD | Admitting: General Surgery

## 2018-05-26 VITALS — BP 112/82 | HR 74 | Resp 14 | Ht 65.0 in | Wt 321.0 lb

## 2018-05-26 DIAGNOSIS — D1722 Benign lipomatous neoplasm of skin and subcutaneous tissue of left arm: Secondary | ICD-10-CM

## 2018-05-26 HISTORY — DX: Benign lipomatous neoplasm of skin and subcutaneous tissue of left arm: D17.22

## 2018-05-26 NOTE — Progress Notes (Signed)
Patient ID: Erika Hanson, female   DOB: Oct 09, 1966, 52 y.o.   MRN: [id number]  Chief Complaint  Patient presents with  . Mass    HPI Erika Hanson is a 52 y.o. female.  Here for evaluation of a left forearm mass referred by Dr Tyler Aas. She states she noticed this about 6 months ago. She states it started as a dry scaly area then the knot formed.She states it has gotten larger but no pain. She states with press the area is tender. She states she is worried about "what it could be". She is here with her mom, Erika Hanson.  HPI  Past Medical History:  Diagnosis Date  . Acid reflux   . Anemia, iron deficiency 09/22/2017  . Anxiety   . Arthritis   . Constipation   . Depression   . Fibroid 03/19/2018  . H/O Bell's palsy    right in 2003   . Hypertension   . Insomnia   . Morbid obesity ([facility])   . Muscle spasm   . Nocturia   . Numbness in feet   . Weakness     Past Surgical History:  Procedure Laterality Date  . ANTERIOR CERVICAL DECOMP/DISCECTOMY FUSION N/A 04/22/2018   Procedure: ANTERIOR CERVICAL DECOMPRESSION FUSION, CERVICAL FIVE-SIX, CERVICAL SIX-SEVEN WITH INSTRUMENTATION AND ALLOGRAFT;  Surgeon: Phylliss Bob, MD;  Location: [facility];  Service: Orthopedics;  Laterality: N/A;  . CESAREAN SECTION    . NECK SURGERY      Family History  Problem Relation Age of Onset  . Hypertension Mother   . Depression Mother   . Melanoma Mother   . Alzheimer's disease Father   . Diabetes Father   . Hypertension Father   . Drug abuse Brother   . Anxiety disorder Brother   . Depression Brother   . Breast cancer Neg Hx     Social History Social History   Tobacco Use  . Smoking status: Never Smoker  . Smokeless tobacco: Never Used  Substance Use Topics  . Alcohol use: No  . Drug use: No    No Known Allergies  Current Outpatient Medications  Medication Sig Dispense Refill  . Biotin 10000 MCG TABS Take 2 tablets by mouth.    Marland Kitchen buPROPion (WELLBUTRIN XL) 150 MG  24 hr tablet Take 1 tablet (150 mg total) by mouth daily. 90 tablet 1  . busPIRone (BUSPAR) 15 MG tablet Take 1 tablet (15 mg total) by mouth 3 (three) times daily. 270 tablet 1  . celecoxib (CELEBREX) 100 MG capsule TAKE 1 CAPSULE BY MOUTH 2 TIMES DAILY 60 capsule 3  . Cholecalciferol 50000 units capsule Take 1 capsule (50,000 Units total) by mouth once a week. 13 capsule 1  . clonazePAM (KLONOPIN) 0.5 MG tablet TAKE 1 TABLET BY MOUTH TWICE DAILY AS NEEDED FOR ANXIETY 45 tablet 1  . DULoxetine (CYMBALTA) 30 MG capsule Total of  90mg  daily 30 capsule 11  . DULoxetine (CYMBALTA) 60 MG capsule Take 90 mg daily (60 mg + 30 mg) 30 capsule 11  . ferrous sulfate 325 (65 FE) MG tablet Take 1 tablet (325 mg total) 2 (two) times daily with a meal by mouth.    . hydrochlorothiazide (HYDRODIURIL) 25 MG tablet TAKE 1 TABLET BY MOUTH ONCE A DAY 30 tablet 6  . lamoTRIgine (LAMICTAL) 100 MG tablet Take 1 tablet (100 mg total) by mouth daily. 90 tablet 1  . linaclotide (LINZESS) 290 MCG CAPS capsule Take 1 capsule (290 mcg total) by mouth  daily before breakfast. 30 capsule 3  . losartan (COZAAR) 50 MG tablet TAKE 1 TABLET BY MOUTH ONCE A DAY FOR HIGH BLOOD PRESSURE. 60 tablet 6  . omeprazole (PRILOSEC) 20 MG capsule Take 20 mg by mouth at bedtime.    Marland Kitchen tiZANidine (ZANAFLEX) 4 MG tablet Take 1 tablet (4 mg total) by mouth every 8 (eight) hours as needed for muscle spasms. 30 tablet 2  . topiramate (TOPAMAX) 200 MG tablet Take 1 tablet (200 mg total) by mouth 2 (two) times daily. 180 tablet 2  . traZODone (DESYREL) 50 MG tablet Take 1-2 tablets (50-100 mg total) by mouth at bedtime as needed. for sleep 180 tablet 1   No current facility-administered medications for this visit.     Review of Systems Review of Systems  Constitutional: Negative.   Respiratory: Negative.   Cardiovascular: Negative.     Blood pressure 112/82, pulse 74, resp. rate 14, height 5\' 5"  (1.651 m), weight (!) 321 lb (145.6 kg), last  menstrual period 05/21/2016, SpO2 98 %.  Physical Exam Physical Exam  Constitutional: She is oriented to person, place, and time. She appears well-developed and well-nourished.  Musculoskeletal:       Arms: Neurological: She is alert and oriented to person, place, and time.  Skin: Skin is warm and dry.  Thickening left lateral forearm  Psychiatric: Her behavior is normal.    Data Reviewed IMRI of the left forearm:  MPRESSION: Ill-defined area of thickened septae in the subcutaneous fat along the mid ulnar aspect of the forearm with mild overlying skin thickening. This may reflect a soft tissue contusion versus an area of fat necrosis. No definable soft tissue mass is identified.   Electronically Signed   By: Kathreen Devoid   On: 04/09/2018 Assessment    Focal lipoma of the left forearm without acute inflammation or induration.    Plan    At this time, with minimal change over the years and a negative MRI last year, would not recommend local excision unless the area significantly increases in size, become symptomatic with direct pressure with pain or produces such difficult anxiety as to mandate excision.  Follow up as needed or if symptoms worsen. The patient is aware to call back for any questions or new concerns.      HPI, Physical Exam, Assessment and Plan have been scribed under the direction and in the presence of Erika Bellow, MD. Erika Fetch, RN  I have completed the exam and reviewed the above documentation for accuracy and completeness.  I agree with the above.  Erika Hanson has been used and any errors in dictation or transcription are unintentional.  Hervey Ard, M.D., F.A.C.S.  Forest Gleason Verlia Kaney 05/26/2018, 6:47 PM

## 2018-05-26 NOTE — Patient Instructions (Addendum)
The patient is aware to call back for any questions or new concerns. Follow up as needed or if symptoms worsen. 

## 2018-05-27 ENCOUNTER — Encounter: Payer: Self-pay | Admitting: Internal Medicine

## 2018-05-27 DIAGNOSIS — M7662 Achilles tendinitis, left leg: Secondary | ICD-10-CM | POA: Insufficient documentation

## 2018-05-27 DIAGNOSIS — M7661 Achilles tendinitis, right leg: Secondary | ICD-10-CM | POA: Insufficient documentation

## 2018-06-02 ENCOUNTER — Ambulatory Visit: Payer: BLUE CROSS/BLUE SHIELD | Admitting: Licensed Clinical Social Worker

## 2018-06-15 ENCOUNTER — Other Ambulatory Visit (HOSPITAL_COMMUNITY): Payer: BLUE CROSS/BLUE SHIELD | Attending: Psychiatry | Admitting: Licensed Clinical Social Worker

## 2018-06-15 ENCOUNTER — Other Ambulatory Visit: Payer: Self-pay | Admitting: Psychiatry

## 2018-06-15 ENCOUNTER — Other Ambulatory Visit: Payer: Self-pay | Admitting: Family Medicine

## 2018-06-15 DIAGNOSIS — F4310 Post-traumatic stress disorder, unspecified: Secondary | ICD-10-CM | POA: Insufficient documentation

## 2018-06-15 DIAGNOSIS — Z79899 Other long term (current) drug therapy: Secondary | ICD-10-CM | POA: Insufficient documentation

## 2018-06-15 DIAGNOSIS — F331 Major depressive disorder, recurrent, moderate: Secondary | ICD-10-CM | POA: Diagnosis not present

## 2018-06-15 DIAGNOSIS — I10 Essential (primary) hypertension: Secondary | ICD-10-CM | POA: Insufficient documentation

## 2018-06-15 DIAGNOSIS — F411 Generalized anxiety disorder: Secondary | ICD-10-CM | POA: Diagnosis not present

## 2018-06-15 DIAGNOSIS — G4700 Insomnia, unspecified: Secondary | ICD-10-CM | POA: Insufficient documentation

## 2018-06-18 ENCOUNTER — Ambulatory Visit (INDEPENDENT_AMBULATORY_CARE_PROVIDER_SITE_OTHER): Payer: BLUE CROSS/BLUE SHIELD | Admitting: Internal Medicine

## 2018-06-18 ENCOUNTER — Encounter: Payer: Self-pay | Admitting: Internal Medicine

## 2018-06-18 VITALS — BP 110/76 | HR 60 | Temp 97.5°F | Ht 65.0 in | Wt 320.0 lb

## 2018-06-18 DIAGNOSIS — M545 Low back pain, unspecified: Secondary | ICD-10-CM | POA: Insufficient documentation

## 2018-06-18 DIAGNOSIS — S91302A Unspecified open wound, left foot, initial encounter: Secondary | ICD-10-CM | POA: Diagnosis not present

## 2018-06-18 DIAGNOSIS — M25559 Pain in unspecified hip: Secondary | ICD-10-CM

## 2018-06-18 DIAGNOSIS — F4010 Social phobia, unspecified: Secondary | ICD-10-CM | POA: Insufficient documentation

## 2018-06-18 DIAGNOSIS — M62838 Other muscle spasm: Secondary | ICD-10-CM

## 2018-06-18 DIAGNOSIS — Z23 Encounter for immunization: Secondary | ICD-10-CM

## 2018-06-18 DIAGNOSIS — Z124 Encounter for screening for malignant neoplasm of cervix: Secondary | ICD-10-CM

## 2018-06-18 HISTORY — DX: Low back pain, unspecified: M54.50

## 2018-06-18 MED ORDER — TIZANIDINE HCL 4 MG PO TABS: 4.0000 mg | ORAL_TABLET | Freq: Three times a day (TID) | ORAL | 11 refills | Status: DC | PRN

## 2018-06-18 MED ORDER — MUPIROCIN 2 % EX OINT: 1.0000 "application " | TOPICAL_OINTMENT | Freq: Three times a day (TID) | CUTANEOUS | 0 refills | Status: DC

## 2018-06-18 MED ORDER — CLONAZEPAM 0.5 MG PO TABS: 0.5000 mg | ORAL_TABLET | Freq: Every day | ORAL | 2 refills | Status: DC | PRN
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
## 2018-06-18 NOTE — Patient Instructions (Addendum)
Steri strips for left foot wound and keep covered. Use Bactroban 2-3 x per day Referred to Dr. Georgianne Fick for pap [facility]  Make GI appt when you can  F/u in 4 months   Tdap/DTaP Vaccine (Diphtheria, Tetanus, and Pertussis): What You Need to Know 1. Why get vaccinated? Diphtheria, tetanus, and pertussis are serious diseases caused by bacteria. Diphtheria and pertussis are spread from person to person. Tetanus enters the body through cuts or wounds. DIPHTHERIA causes a thick covering in the back of the throat.  It can lead to breathing problems, paralysis, heart failure, and even death.  TETANUS (Lockjaw) causes painful tightening of the muscles, usually all over the body.  It can lead to "locking" of the jaw so the victim cannot open his mouth or swallow. Tetanus leads to death in up to 2 out of 10 cases.  PERTUSSIS (Whooping Cough) causes coughing spells so bad that it is hard for infants to eat, drink, or breathe. These spells can last for weeks.  It can lead to pneumonia, seizures (jerking and staring spells), brain damage, and death.  Diphtheria, tetanus, and pertussis vaccine (DTaP) can help prevent these diseases. Most children who are vaccinated with DTaP will be protected throughout childhood. Many more children would get these diseases if we stopped vaccinating. DTaP is a safer version of an older vaccine called DTP. DTP is no longer used in the [place]. 2. Who should get DTaP vaccine and when? Children should get 5 doses of DTaP vaccine, one dose at each of the following ages:  2 months  4 months  6 months  15-18 months  4-6 years  DTaP may be given at the same time as other vaccines. 3. Some children should not get DTaP vaccine or should wait  Children with minor illnesses, such as a cold, may be vaccinated. But children who are moderately or severely ill should usually wait until they recover before getting DTaP vaccine.  Any child who had a life-threatening  allergic reaction after a dose of DTaP should not get another dose.  Any child who suffered a brain or nervous system disease within 7 days after a dose of DTaP should not get another dose.  Talk with your doctor if your child: ? had a seizure or collapsed after a dose of DTaP, ? cried non-stop for 3 hours or more after a dose of DTaP, ? had a fever over 105F after a dose of DTaP. Ask your doctor for more information. Some of these children should not get another dose of pertussis vaccine, but may get a vaccine without pertussis, called DT. 4. Older children and adults DTaP is not licensed for adolescents, adults, or children 38 years of age and older. But older people still need protection. A vaccine called Tdap is similar to DTaP. A single dose of Tdap is recommended for people 11 through 50 years of age. Another vaccine, called Td, protects against tetanus and diphtheria, but not pertussis. It is recommended every 10 years. There are separate Vaccine Information Statements for these vaccines. 5. What are the risks from DTaP vaccine? Getting diphtheria, tetanus, or pertussis disease is much riskier than getting DTaP vaccine. However, a vaccine, like any medicine, is capable of causing serious problems, such as severe allergic reactions. The risk of DTaP vaccine causing serious harm, or death, is extremely small. Mild problems (common)  Fever (up to about 1 child in 4)  Redness or swelling where the shot was given (up to about 1  child in 4)  Soreness or tenderness where the shot was given (up to about 1 child in 4) These problems occur more often after the 4th and 5th doses of the DTaP series than after earlier doses. Sometimes the 4th or 5th dose of DTaP vaccine is followed by swelling of the entire arm or leg in which the shot was given, lasting 1-7 days (up to about 1 child in 59). Other mild problems include:  Fussiness (up to about 1 child in 3)  Tiredness or poor appetite (up to  about 1 child in 10)  Vomiting (up to about 1 child in 22) These problems generally occur 1-3 days after the shot. Moderate problems (uncommon)  Seizure (jerking or staring) (about 1 child out of 14,000)  Non-stop crying, for 3 hours or more (up to about 1 child out of 1,000)  High fever, over 105F (about 1 child out of 16,000) Severe problems (very rare)  Serious allergic reaction (less than 1 out of a million doses)  Several other severe problems have been reported after DTaP vaccine. These include: ? Long-term seizures, coma, or lowered consciousness ? Permanent brain damage. These are so rare it is hard to tell if they are caused by the vaccine. Controlling fever is especially important for children who have had seizures, for any reason. It is also important if another family member has had seizures. You can reduce fever and pain by giving your child an aspirin-free pain reliever when the shot is given, and for the next 24 hours, following the package instructions. 6. What if there is a serious reaction? What should I look for? Look for anything that concerns you, such as signs of a severe allergic reaction, very high fever, or behavior changes. Signs of a severe allergic reaction can include hives, swelling of the face and throat, difficulty breathing, a fast heartbeat, dizziness, and weakness. These would start a few minutes to a few hours after the vaccination. What should I do?  If you think it is a severe allergic reaction or other emergency that can't wait, call 9-1-1 or get the person to the nearest hospital. Otherwise, call your doctor.  Afterward, the reaction should be reported to the Vaccine Adverse Event Reporting System (VAERS). Your doctor might file this report, or you can do it yourself through the VAERS web site at www.vaers.SamedayNews.es, or by calling 458-146-6990. ? VAERS is only for reporting reactions. They do not give medical advice. 7. The National Vaccine  Injury Compensation Program The [employer] Vaccine Injury Compensation Program (VICP) is a federal program that was created to compensate people who may have been injured by certain vaccines. Persons who believe they may have been injured by a vaccine can learn about the program and about filing a claim by calling (910)876-0645 or visiting the [place] website at GoldCloset.com.ee. 8. How can I learn more?  Ask your doctor.  Call your local or state health department.  Contact the Centers for Disease Control and Prevention (CDC): ? Call 9738104716 (1-800-CDC-INFO) or ? Visit CDC's website at http://hunter.com/ CDC DTaP Vaccine (Diphtheria, Tetanus, and Pertussis) VIS (04/10/06) This information is not intended to replace advice given to you by your health care provider. Make sure you discuss any questions you have with your health care provider. Document Released: 09/08/2006 Document Revised: 08/01/2016 Document Reviewed: 08/01/2016 Elsevier Interactive Patient Education  2017 [place], Adult Taking care of your wound properly can help to prevent pain and  infection. It can also help your wound to heal more quickly. How is this treated? Wound care  Follow instructions from your health care provider about how to take care of your wound. Make sure you: ? Wash your hands with soap and water before you change the bandage (dressing). If soap and water are not available, use hand sanitizer. ? Change your dressing as told by your health care provider. ? Leave stitches (sutures), skin glue, or adhesive strips in place. These skin closures may need to stay in place for 2 weeks or longer. If adhesive strip edges start to loosen and curl up, you may trim the loose edges. Do not remove adhesive strips completely unless your health care provider tells you to do that.  Check your wound area every day for signs of infection. Check for: ? More redness, swelling, or  pain. ? More fluid or blood. ? Warmth. ? Pus or a bad smell.  Ask your health care provider if you should clean the wound with mild soap and water. Doing this may include: ? Using a clean towel to pat the wound dry after cleaning it. Do not rub or scrub the wound. ? Applying a cream or ointment. Do this only as told by your health care provider. ? Covering the incision with a clean dressing.  Ask your health care provider when you can leave the wound uncovered. Medicines   If you were prescribed an antibiotic medicine, cream, or ointment, take or use the antibiotic as told by your health care provider. Do not stop taking or using the antibiotic even if your condition improves.  Take over-the-counter and prescription medicines only as told by your health care provider. If you were prescribed pain medicine, take it at least 30 minutes before doing any wound care or as told by your health care provider. General instructions  Return to your normal activities as told by your health care provider. Ask your health care provider what activities are safe.  Do not scratch or pick at the wound.  Keep all follow-up visits as told by your health care provider. This is important.  Eat a diet that includes protein, vitamin A, vitamin C, and other nutrient-rich foods. These help the wound heal: ? Protein-rich foods include meat, dairy, beans, nuts, and other sources. ? Vitamin A-rich foods include carrots and dark green, leafy vegetables. ? Vitamin C-rich foods include citrus, tomatoes, and other fruits and vegetables. ? Nutrient-rich foods have protein, carbohydrates, fat, vitamins, or minerals. Eat a variety of healthy foods including vegetables, fruits, and whole grains. Contact a health care provider if:  You received a tetanus shot and you have swelling, severe pain, redness, or bleeding at the injection site.  Your pain is not controlled with medicine.  You have more redness, swelling, or  pain around the wound.  You have more fluid or blood coming from the wound.  Your wound feels warm to the touch.  You have pus or a bad smell coming from the wound.  You have a fever or chills.  You are nauseous or you vomit.  You are dizzy. Get help right away if:  You have a red streak going away from your wound.  The edges of the wound open up and separate.  Your wound is bleeding and the bleeding does not stop with gentle pressure.  You have a rash.  You faint.  You have trouble breathing. This information is not intended to replace advice given to you by your  health care provider. Make sure you discuss any questions you have with your health care provider. Document Released: 08/20/2008 Document Revised: 07/10/2016 Document Reviewed: 05/28/2016 Elsevier Interactive Patient Education  2017 [employer].

## 2018-06-18 NOTE — Progress Notes (Signed)
Pre visit review using our clinic review tool, if applicable. No additional management support is needed unless otherwise documented below in the visit note. 

## 2018-06-18 NOTE — Progress Notes (Addendum)
No chief complaint on file.  F/u  1. [place] Tdap using Lifebuoy soap did not get checked out 2. Chronic neck pain and stiffness s/p surgery tramadol 50 mg q6 1-2 pills not helping and chronic hip and low back pain appt Dr. Dixie Dials 8/12 and Dr. Lynann Bologna 8/19 for f/u pain still there balance still a problem and cant tell if tingling/numbness is better in hands 3. Social anxiety needs klonopin refilled taking prn rec further refills Dr. Shea Evans   Review of Systems  Constitutional: Negative for weight loss.  HENT: Negative for hearing loss.   Eyes: Negative for blurred vision.  Respiratory: Negative for shortness of breath.   Cardiovascular: Negative for chest pain.  Musculoskeletal: Positive for back pain and neck pain.  Skin: Negative for rash.       +open wound   Neurological: Positive for sensory change.   Past Medical History:  Diagnosis Date  . Acid reflux   . Anemia, iron deficiency 09/22/2017  . Anxiety   . Arthritis   . Constipation   . Depression   . Fibroid 03/19/2018  . H/O Bell's palsy    right in 2003   . Hypertension   . Insomnia   . Morbid obesity ([place])   . Muscle spasm   . Nocturia   . Numbness in feet   . Weakness    Past Surgical History:  Procedure Laterality Date  . ANTERIOR CERVICAL DECOMP/DISCECTOMY FUSION N/A 04/22/2018   Procedure: ANTERIOR CERVICAL DECOMPRESSION FUSION, CERVICAL FIVE-SIX, CERVICAL SIX-SEVEN WITH INSTRUMENTATION AND ALLOGRAFT;  Surgeon: Phylliss Bob, MD;  Location: [facility];  Service: Orthopedics;  Laterality: N/A;  . CESAREAN SECTION    . NECK SURGERY     Family History  Problem Relation Age of Onset  . Hypertension Mother   . Depression Mother   . Melanoma Mother   . Alzheimer's disease Father   . Diabetes Father   . Hypertension Father   . Drug abuse Brother   . Anxiety disorder Brother   . Depression Brother   . Breast cancer Neg Hx    Social History   Socioeconomic History  .  Marital status: Divorced    Spouse name: Not on file  . Number of children: 1  . Years of education: HS  . Highest education level: High school graduate  Occupational History  . 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 Financial resource strain: Not hard at all  . Food insecurity:    Worry: Never true    Inability: Never true  . Transportation needs:    Medical: No    Non-medical: No  Tobacco Use  . Smoking status: Never Smoker  . Smokeless tobacco: Never Used  Substance and Sexual Activity  . Alcohol use: No  . Drug use: No  . Sexual activity: Yes    Birth control/protection: None  Lifestyle  . Physical activity:    Days per week: 0 days    Minutes per session: 0 min  . Stress: Very much  Relationships  . Social connections:    Talks on phone: Three times a week    Gets together: Three times a week    Attends religious service: Never    Active member of club or organization: No    Attends meetings of clubs or organizations: Never    Relationship status: Divorced  . Intimate partner violence:    Fear  of current or ex partner: No    Emotionally abused: No    Physically abused: No    Forced sexual activity: No  Other Topics Concern  . Not on file  Social History Narrative   Lives at home with her son.   Right-handed.   8 cups tea and 1 can of [facility] per day.   As of 03/19/18 pt has disability pending and not working    No outpatient medications have been marked as taking for the 06/18/18 encounter (Appointment) with McLean-Scocuzza, [name], MD.   No Known Allergies Recent Results (from the past 2160 hour(s))  Heavy Metals Profile, Urine     Status: None   Collection Time: 03/23/18 12:34 PM  Result Value Ref Range   Arsenic, 24H Ur <10 <=80 mcg/L    Comment: . This test was developed and its analytical performance characteristics have been determined by [place], [place]. It has not been cleared  or approved by the U.S. Food and Drug Administration. This assay has been validated pursuant to the CLIA regulations and is used for clinical purposes. .    Lead, 24 hr urine <10 <80 mcg/L    Comment: . This test was developed and its analytical performance characteristics have been determined by [place], [place]. It has not been cleared or approved by the U.S. Food and Drug Administration. This assay has been validated pursuant to the CLIA regulations and is used for clinical purposes. .    Mercury, 24H Ur <4 <=20 mcg/L    Comment: .                       TOXIC: > OR = 150 mcg/L . Marland Kitchen This test was developed and its analytical performance characteristics have been determined by [place], [place]. It has not been cleared or approved by the U.S. Food and Drug Administration. This assay has been validated pursuant to the CLIA regulations and is used for clinical purposes. .   APTT     Status: None   Collection Time: 04/22/18 10:06 AM  Result Value Ref Range   aPTT 29 24 - 36 seconds    Comment: Performed at [facility] [address] [place], [place] [address]  CBC WITH DIFFERENTIAL     Status: None   Collection Time: 04/22/18 10:06 AM  Result Value Ref Range   WBC 5.7 4.0 - 10.5 K/uL   RBC 4.27 3.87 - 5.11 MIL/uL   Hemoglobin 12.2 12.0 - 15.0 g/dL   HCT 38.5 36.0 - 46.0 %   MCV 90.2 78.0 - 100.0 fL   MCH 28.6 26.0 - 34.0 pg   MCHC 31.7 30.0 - 36.0 g/dL   RDW 14.2 11.5 - 15.5 %   Platelets 222 150 - 400 K/uL   Neutrophils Relative % 63 %   Neutro Abs 3.7 1.7 - 7.7 K/uL   Lymphocytes Relative 24 %   Lymphs Abs 1.4 0.7 - 4.0 K/uL   Monocytes Relative 8 %   Monocytes Absolute 0.5 0.1 - 1.0 K/uL   Eosinophils Relative 3 %   Eosinophils Absolute 0.2 0.0 - 0.7 K/uL   Basophils Relative 1 %   Basophils Absolute 0.0 0.0 - 0.1 K/uL   Immature Granulocytes 1 %   Abs Immature Granulocytes 0.0 0.0 -  0.1 K/uL    Comment: Performed at [facility], [place] [address],  [place], [place] [address]  Comprehensive metabolic panel     Status: Abnormal   Collection Time: 04/22/18 10:06 AM  Result Value Ref Range   Sodium 140 135 - 145 mmol/L   Potassium 3.5 3.5 - 5.1 mmol/L   Chloride 105 101 - 111 mmol/L   CO2 26 22 - 32 mmol/L   Glucose, Bld 109 (H) 65 - 99 mg/dL   BUN 26 (H) 6 - 20 mg/dL   Creatinine, Ser 1.23 (H) 0.44 - 1.00 mg/dL   Calcium 9.2 8.9 - 10.3 mg/dL   Total Protein 6.5 6.5 - 8.1 g/dL   Albumin 3.4 (L) 3.5 - 5.0 g/dL   AST 13 (L) 15 - 41 U/L   ALT 17 14 - 54 U/L   Alkaline Phosphatase 83 38 - 126 U/L   Total Bilirubin 0.6 0.3 - 1.2 mg/dL   GFR calc non Af Amer 51 (L) >60 mL/min   GFR calc Af Amer 59 (L) >60 mL/min    Comment: (NOTE) The eGFR has been calculated using the CKD EPI equation. This calculation has not been validated in all clinical situations. eGFR's persistently <60 mL/min signify possible Chronic Kidney Disease.    Anion gap 9 5 - 15    Comment: Performed at [facility] [address], [place], [place] [address]  Protime-INR     Status: None   Collection Time: 04/22/18 10:06 AM  Result Value Ref Range   Prothrombin Time 13.7 11.4 - 15.2 seconds   INR 1.06     Comment: Performed at [facility], [place] [address], [place], [place] [address]  Urinalysis, Routine w reflex microscopic     Status: None   Collection Time: 04/22/18 10:06 AM  Result Value Ref Range   Color, Urine YELLOW YELLOW   APPearance CLEAR CLEAR   Specific Gravity, Urine 1.018 1.005 - 1.030   pH 6.0 5.0 - 8.0   Glucose, UA NEGATIVE NEGATIVE mg/dL   Hgb urine dipstick NEGATIVE NEGATIVE   Bilirubin Urine NEGATIVE NEGATIVE   Ketones, ur NEGATIVE NEGATIVE mg/dL   Protein, ur NEGATIVE NEGATIVE mg/dL   Nitrite NEGATIVE NEGATIVE   Leukocytes, UA NEGATIVE NEGATIVE    Comment: Performed at [facility] [address], [place], [place] [address]   Objective  There  is no height or weight on file to calculate BMI. Wt Readings from Last 3 Encounters:  05/26/18 (!) 321 lb (145.6 kg)  04/22/18 (!) 334 lb (151.5 kg)  04/17/18 (!) 334 lb (151.5 kg)   Temp Readings from Last 3 Encounters:  04/23/18 98.5 F (36.9 C) (Oral)  04/17/18 97.6 F (36.4 C) (Oral)  03/19/18 98.5 F (36.9 C) (Oral)   BP Readings from Last 3 Encounters:  05/26/18 112/82  04/23/18 132/75  04/17/18 136/80   Pulse Readings from Last 3 Encounters:  05/26/18 74  04/23/18 72  04/17/18 68    Physical Exam  Constitutional: She is oriented to person, place, and time. Vital signs are normal. She appears well-developed and well-nourished. She is cooperative.  HENT:  Head: Normocephalic and atraumatic.  Mouth/Throat: Oropharynx is clear and moist and mucous membranes are normal.  Eyes: Pupils are equal, round, and reactive to light. Conjunctivae are normal.  Cardiovascular: Normal rate, regular rhythm and normal heart sounds.  Pulmonary/Chest: Effort normal and breath sounds normal.  Neurological: She is alert and oriented to person, place, and time. Gait normal.  Skin: Skin is warm and dry. Laceration noted.  Psychiatric: She has a normal mood and affect. Her speech is normal and behavior is normal. Judgment and thought content normal. Cognition and memory are normal.  Nursing note and vitals reviewed.   Assessment   1. Left foot laceration  2. Chronic pain neck hip and back  3. Social anxiety  4. HM Plan   1. Tdap today  bactroban steri strips rec  2. F/u ortho see [facility] 8/12 Dr. Mina Marble and 8/19 Dr. Lynann Bologna  Pt upcoming  3. Refilled klonopin rec further refills Dr. Shea Evans  4.  Assess vaccines at f/u  -No immunizations found in [facility] -Tdap today    mammogram 04/09/18 negative   Need to refer to OB/GYN for pap in future Dr. Georgianne Fick  Referred for colonoscopy and possibly EGD with h/o iron def anemia pt will schedule  -pt sch 05/18/18 but will need to resch    DEXA-no current indication   CKD 3 saw [facility] 09/18/18 Dr Candiss Norse GFR 51 07/23/18 f/u in 2 weeks w/u proteinuria with renal US, urine culture, bw    Provider: Dr. Olivia Mackie McLean-Scocuzza-Internal Medicine

## 2018-06-22 ENCOUNTER — Other Ambulatory Visit (HOSPITAL_COMMUNITY): Payer: BLUE CROSS/BLUE SHIELD | Admitting: Psychiatry

## 2018-06-22 ENCOUNTER — Encounter (HOSPITAL_COMMUNITY): Payer: Self-pay | Admitting: Psychiatry

## 2018-06-22 DIAGNOSIS — Z79899 Other long term (current) drug therapy: Secondary | ICD-10-CM | POA: Diagnosis not present

## 2018-06-22 DIAGNOSIS — G4700 Insomnia, unspecified: Secondary | ICD-10-CM | POA: Diagnosis not present

## 2018-06-22 DIAGNOSIS — I10 Essential (primary) hypertension: Secondary | ICD-10-CM | POA: Diagnosis not present

## 2018-06-22 DIAGNOSIS — F4310 Post-traumatic stress disorder, unspecified: Secondary | ICD-10-CM | POA: Diagnosis not present

## 2018-06-22 DIAGNOSIS — F331 Major depressive disorder, recurrent, moderate: Secondary | ICD-10-CM
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
## 2018-06-23 ENCOUNTER — Other Ambulatory Visit (HOSPITAL_COMMUNITY): Payer: BLUE CROSS/BLUE SHIELD | Admitting: Family

## 2018-06-23 DIAGNOSIS — F331 Major depressive disorder, recurrent, moderate: Secondary | ICD-10-CM

## 2018-06-23 NOTE — Progress Notes (Signed)
    Daily Group Progress Note  Program: IOP  Group Time: 9:00-12:00  Participation Level: Active  Behavioral Response: Appropriate  Type of Therapy:  Group Therapy  Summary of Progress: Pt. Smiled appropriately, mostly quiet, joined with other group members regarding communication with significant other. Pt. Met with case manager for intake assessment. Pt. Participated in medication management discussion facilitated by the pharmacist.    Brown, Jennifer B, LPC 

## 2018-06-24 ENCOUNTER — Encounter (HOSPITAL_COMMUNITY): Payer: Self-pay | Admitting: Family

## 2018-06-24 ENCOUNTER — Other Ambulatory Visit (HOSPITAL_COMMUNITY): Payer: BLUE CROSS/BLUE SHIELD | Admitting: Psychiatry

## 2018-06-24 DIAGNOSIS — F331 Major depressive disorder, recurrent, moderate: Secondary | ICD-10-CM
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
## 2018-06-25 ENCOUNTER — Other Ambulatory Visit (HOSPITAL_COMMUNITY): Payer: BLUE CROSS/BLUE SHIELD | Attending: Psychiatry | Admitting: Psychiatry

## 2018-06-25 DIAGNOSIS — F419 Anxiety disorder, unspecified: Secondary | ICD-10-CM | POA: Insufficient documentation

## 2018-06-25 DIAGNOSIS — F331 Major depressive disorder, recurrent, moderate: Secondary | ICD-10-CM

## 2018-06-25 DIAGNOSIS — F329 Major depressive disorder, single episode, unspecified: Secondary | ICD-10-CM | POA: Diagnosis present

## 2018-06-25 NOTE — Progress Notes (Signed)
    Daily Group Progress Note  Program: IOP  Group Time: 9:00-12:00  Participation Level: Active  Behavioral Response: Appropriate  Type of Therapy:  Group Therapy  Summary of Progress: Pt. Presented with bright affect, calm but alert, talkative, and engaged in the group process. Pt. Shared with the group that she was planning to go to the pool this afternoon and was looking forward to this activity. Pt. Participated in reflective reading, guided visualization, meditation, and instruction on breathwork. Pt. Participated in yoga therapy session with Jan Fireman.       Nancie Neas, LPC

## 2018-06-26 ENCOUNTER — Other Ambulatory Visit (HOSPITAL_COMMUNITY): Payer: BLUE CROSS/BLUE SHIELD | Admitting: Psychiatry

## 2018-06-26 DIAGNOSIS — F419 Anxiety disorder, unspecified: Secondary | ICD-10-CM | POA: Diagnosis not present

## 2018-06-26 DIAGNOSIS — F331 Major depressive disorder, recurrent, moderate: Secondary | ICD-10-CM

## 2018-06-26 NOTE — Progress Notes (Signed)
    Daily Group Progress Note  Program: IOP  Group Time: 9:00-12:00  Participation Level: Active  Behavioral Response: Appropriate  Type of Therapy:  Group Therapy  Summary of Progress: Pt. Presented with bright affect, talkative, engaged in the group process. Pt. Complained of neck and back pain, and discussed plan to take it easy over the weekend and rest. Pt. Shared tendency to compare herself to her former self before her pain. Pt. Participated in discussion about use of the grounding series for anxiety and panic attacks, reviewed yoga sequence from yesterday's group. Pt. Also participated in discussion about "Just for today" building one-day-at-a-time mindset.     Nancie Neas, LPC

## 2018-06-29 ENCOUNTER — Other Ambulatory Visit (HOSPITAL_COMMUNITY): Payer: BLUE CROSS/BLUE SHIELD

## 2018-06-30 ENCOUNTER — Other Ambulatory Visit (HOSPITAL_COMMUNITY): Payer: BLUE CROSS/BLUE SHIELD | Admitting: Psychiatry

## 2018-06-30 DIAGNOSIS — F331 Major depressive disorder, recurrent, moderate: Secondary | ICD-10-CM

## 2018-06-30 DIAGNOSIS — F419 Anxiety disorder, unspecified: Secondary | ICD-10-CM | POA: Diagnosis not present
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
## 2018-07-01 ENCOUNTER — Other Ambulatory Visit (HOSPITAL_COMMUNITY): Payer: BLUE CROSS/BLUE SHIELD | Admitting: Psychiatry

## 2018-07-01 DIAGNOSIS — F331 Major depressive disorder, recurrent, moderate: Secondary | ICD-10-CM

## 2018-07-01 DIAGNOSIS — F419 Anxiety disorder, unspecified: Secondary | ICD-10-CM | POA: Diagnosis not present
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
## 2018-07-02 ENCOUNTER — Ambulatory Visit: Payer: BLUE CROSS/BLUE SHIELD | Admitting: Licensed Clinical Social Worker

## 2018-07-02 ENCOUNTER — Other Ambulatory Visit (HOSPITAL_COMMUNITY): Payer: BLUE CROSS/BLUE SHIELD | Admitting: Psychiatry

## 2018-07-02 DIAGNOSIS — F419 Anxiety disorder, unspecified: Secondary | ICD-10-CM | POA: Diagnosis not present

## 2018-07-02 DIAGNOSIS — F331 Major depressive disorder, recurrent, moderate: Secondary | ICD-10-CM

## 2018-07-03 ENCOUNTER — Other Ambulatory Visit (HOSPITAL_COMMUNITY): Payer: BLUE CROSS/BLUE SHIELD

## 2018-07-06 ENCOUNTER — Ambulatory Visit: Payer: BLUE CROSS/BLUE SHIELD | Admitting: Psychiatry

## 2018-07-07 ENCOUNTER — Other Ambulatory Visit (HOSPITAL_COMMUNITY): Payer: BLUE CROSS/BLUE SHIELD | Admitting: Psychiatry

## 2018-07-07 DIAGNOSIS — F419 Anxiety disorder, unspecified: Secondary | ICD-10-CM | POA: Diagnosis not present

## 2018-07-07 DIAGNOSIS — F4010 Social phobia, unspecified: Secondary | ICD-10-CM

## 2018-07-08 ENCOUNTER — Other Ambulatory Visit (HOSPITAL_COMMUNITY): Payer: BLUE CROSS/BLUE SHIELD | Admitting: Psychiatry

## 2018-07-08 DIAGNOSIS — F419 Anxiety disorder, unspecified: Secondary | ICD-10-CM | POA: Diagnosis not present

## 2018-07-08 DIAGNOSIS — F4010 Social phobia, unspecified: Secondary | ICD-10-CM
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
## 2018-07-09 ENCOUNTER — Other Ambulatory Visit (HOSPITAL_COMMUNITY): Payer: BLUE CROSS/BLUE SHIELD | Admitting: Psychiatry

## 2018-07-09 DIAGNOSIS — F419 Anxiety disorder, unspecified: Secondary | ICD-10-CM | POA: Diagnosis not present

## 2018-07-09 DIAGNOSIS — F411 Generalized anxiety disorder: Secondary | ICD-10-CM

## 2018-07-09 NOTE — Progress Notes (Signed)
    Daily Group Progress Note  Program: IOP  Group Time: 9:00-12:00  Participation Level: Active  Behavioral Response: Appropriate  Type of Therapy:  Group Therapy  Summary of Progress: Pt. Presented with brightened affect, talkative, engaged in the group process. Pt. Connected with other Pt. around the need to set effective relationship boundaries an prioritizing her self-care. Pt. Participated in discussion led by the therapist about the use of self-compassion I.e., mindfulness, connection to others, and kindness to self and its application for the management of stress. Pt. Participated in yoga therapy with Jan Fireman, LPC, RYT      Nancie Neas, LPC

## 2018-07-09 NOTE — Progress Notes (Signed)
    Daily Group Progress Note  Program: IOP  Group Time: 9:00-12:00  Participation Level: Active  Behavioral Response: Appropriate  Type of Therapy:  Group Therapy  Summary of Progress:The purpose of this contact is to utilize CBT skills in a group setting to decrease active mental health symptoms and increase utilization of healthy coping skills to address boundaries and assertiveness. Clinician checked in with group members. Clinician presented guided meditation and mindfulness skill utilizing breathing skills focused on "Letting Go." Clinician elicited feedback from group members related to activity. Clinician praised client's participation in activity. Clinician inquired about the needs of the group and symptoms which clients felt were priority to address. Clinician presented video on "The Power of Vulnerability." Clinician facilitated discussion related to video, focusing on the connections of thoughts and behaviors relating to the feelings of shame and vulnerability. Clinician praised client's identifying and sharing moments of vulnerability and the related outcomes. Clinician and co-facilitator facilitated discussion related to the helpful and harmful aspects of being vulnerable and the effects of behaviors and symptom severity. Guest speaker attended group to discuss local resources for community mental health groups. Clinician and group discussed the importance of aftercare and engaging with the community following IOP. Clinician provided activity focused on positive self talk and compassion toward self. Clinician assessed for SI/HI/psychosis. Client presents to group fully oriented with appropriate affect. Client did not endorse SI/HI/psychosis. Client participated appropriately in all group activities and was receptive to feedback from group members and clinician. Client reports symptoms to address which she would find most helpful are anxiety and anger-related symptoms.       Nancie Neas, LPC

## 2018-07-10 ENCOUNTER — Other Ambulatory Visit (HOSPITAL_COMMUNITY): Payer: BLUE CROSS/BLUE SHIELD | Admitting: Family

## 2018-07-10 DIAGNOSIS — F411 Generalized anxiety disorder: Secondary | ICD-10-CM
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
## 2018-07-10 NOTE — Progress Notes (Signed)
    Daily Group Progress Note  Program: IOP  Group Time: 9am-12pm  Participation Level: Active  Behavioral Response: Appropriate  Type of Therapy:  Group Therapy  Summary of Progress: The purpose of this group is to decrease active mental health symptoms and increase utilization of healthy skills utilizing CBT and DBT skills in a group setting.  Clinician reviewed check in information and inquired about recently used coping skills. Clinician facilitated discussion related to transitions. Clinician and client reviewed current thoughts and feelings related to group transitions. Clinician and client worked on using CBT triangle to create more helpful thoughts and decreasing levels of anxiety related to changes in support systems. Clinician utilized active listening and reflective statements to motivate continued conversation and self reflection. Clinician praised client on ability to identify and willingness to share thoughts and feelings related to changes and being receptive to feedback from the group.  Clinician presented Self-Care Crisis Plan to assist in planning for skills to help regulation emotions. Clinician prompted client to identify triggers, coping strategies, emergency support systems, and both preventative and reactive plans to address overwhelming emotions. Clinician provided "5-Minute Self Care Breaks" and encouraged use daily to manage emotions. Clinician and client discussed whether activating or relieving activities might be helpful and discussed different situations where each could be useful. Clinician stressed the importance of integration of coping strategies in daily life in order to help make them easier to use when feeling overwhelmed and creating new automatic responses to unhelpful feelings.  Clinician presented mindfulness activity linked with 5-4-3-2-1 skill as wells as turning the mind.  Clinician assessed for SI/HI/psychosis.  Client presented to group fully  oriented. Client participated in all group discussions and activities. Client verbalized progress toward goals throughout the IOP program. Client completed [place] and was able to identify community resources which would continue to be helpful after her discharge.       Olegario Messier, LCSW

## 2018-07-10 NOTE — Patient Instructions (Signed)
D:  Patient successfully completed MH-IOP today.  A: Follow up with Dr. Shea Evans and [name], LCSW.  Strongly encourage support groups.  Recommended [facility] Aftercare Group every Tuesday 5:30-6:30 pm.  *Call Binnie Rail, LCAS first to get registered at [phone].  R:  Pt receptive.

## 2018-07-13 ENCOUNTER — Encounter (HOSPITAL_COMMUNITY): Payer: Self-pay | Admitting: Licensed Clinical Social Worker

## 2018-07-13 ENCOUNTER — Telehealth: Payer: Self-pay | Admitting: Psychiatry

## 2018-07-13 ENCOUNTER — Other Ambulatory Visit (HOSPITAL_COMMUNITY): Payer: BLUE CROSS/BLUE SHIELD
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
## 2018-07-14 ENCOUNTER — Other Ambulatory Visit (HOSPITAL_COMMUNITY): Payer: BLUE CROSS/BLUE SHIELD

## 2018-07-15 ENCOUNTER — Other Ambulatory Visit (HOSPITAL_COMMUNITY): Payer: BLUE CROSS/BLUE SHIELD

## 2018-07-15 ENCOUNTER — Other Ambulatory Visit: Payer: Self-pay

## 2018-07-15 MED ORDER — OMEPRAZOLE 20 MG PO CPDR: 20.0000 mg | DELAYED_RELEASE_CAPSULE | Freq: Every day | ORAL | 1 refills | Status: DC

## 2018-07-15 MED ORDER — LINACLOTIDE 290 MCG PO CAPS: 290.0000 ug | ORAL_CAPSULE | Freq: Every day | ORAL | 3 refills | Status: DC

## 2018-07-16 ENCOUNTER — Encounter: Payer: Self-pay | Admitting: Psychiatry

## 2018-07-16 ENCOUNTER — Ambulatory Visit (INDEPENDENT_AMBULATORY_CARE_PROVIDER_SITE_OTHER): Payer: BLUE CROSS/BLUE SHIELD | Admitting: Psychiatry

## 2018-07-16 ENCOUNTER — Other Ambulatory Visit (HOSPITAL_COMMUNITY): Payer: BLUE CROSS/BLUE SHIELD

## 2018-07-16 ENCOUNTER — Other Ambulatory Visit: Payer: Self-pay

## 2018-07-16 VITALS — BP 132/82 | HR 68 | Temp 97.7°F | Wt 308.6 lb

## 2018-07-16 DIAGNOSIS — F411 Generalized anxiety disorder: Secondary | ICD-10-CM

## 2018-07-16 DIAGNOSIS — F331 Major depressive disorder, recurrent, moderate: Secondary | ICD-10-CM | POA: Diagnosis not present

## 2018-07-16 DIAGNOSIS — F410 Panic disorder [episodic paroxysmal anxiety] without agoraphobia: Secondary | ICD-10-CM | POA: Diagnosis not present

## 2018-07-16 DIAGNOSIS — F4010 Social phobia, unspecified: Secondary | ICD-10-CM | POA: Diagnosis not present

## 2018-07-16 MED ORDER — RISPERIDONE 0.25 MG PO TABS: 0.2500 mg | ORAL_TABLET | Freq: Two times a day (BID) | ORAL | 1 refills | Status: DC

## 2018-07-16 NOTE — Patient Instructions (Signed)
Risperidone tablets What is this medicine? RISPERIDONE (ris PER i done) is an antipsychotic. It is used to treat schizophrenia, bipolar disorder, and some symptoms of autism. This medicine may be used for other purposes; ask your health care provider or pharmacist if you have questions. COMMON BRAND NAME(S): Risperdal What should I tell my health care provider before I take this medicine? They need to know if you have any of these conditions: -blood disorder or disease -dementia -diabetes or a family history of diabetes -difficulty swallowing -heart disease or previous heart attack -history of brain tumor or head injury -history of breast cancer -irregular heartbeat or low blood pressure -kidney or liver disease -Parkinson's disease -seizures (convulsions) -an unusual or allergic reaction to risperidone, paliperidone, other medicines, foods, dyes, or preservatives -pregnant or trying to get pregnant -breast-feeding How should I use this medicine? Take this medicine by mouth with a glass of water. Follow the directions on the prescription label. You can take it with or without food. If it upsets your stomach, take it with food. Take your medicine at regular intervals. Do not take it more often than directed. Do not stop taking except on your doctor's advice. Talk to your pediatrician regarding the use of this medicine in children. While this drug may be prescribed for children as young as 50 years of age for selected conditions, precautions do apply. Overdosage: If you think you have taken too much of this medicine contact a poison control center or emergency room at once. NOTE: This medicine is only for you. Do not share this medicine with others. What if I miss a dose? If you miss a dose, take it as soon as you can. If it is almost time for your next dose, take only that dose. Do not take double or extra doses. What may interact with this medicine? Do not take this medicine with any of  the following medications: -certain medicines for fungal infections like fluconazole, itraconazole, ketoconazole, posaconazole, voriconazole -cisapride -dofetilide -dronedarone -droperidol -pimozide -sparfloxacin -thioridazine This medicine may also interact with the following medications: -arsenic trioxide -certain antibiotics like clarithromycin, gatifloxacin, levofloxacin, moxifloxacin, pentamidine, rifampin -certain medicines for blood pressure -certain medicines for cancer -certain medicines for irregular heart beat -certain medications for Parkinson's disease like levodopa -certain medicines for seizures like carbamazepine -certain medicines for sleep or sedation -narcotic medicines for pain -other medicines for mental anxiety, depression, or psychotic disturbances -other medicines that prolong the QT interval (cause an abnormal heart rhythm) -ritonavir This list may not describe all possible interactions. Give your health care provider a list of all the medicines, herbs, non-prescription drugs, or dietary supplements you use. Also tell them if you smoke, drink alcohol, or use illegal drugs. Some items may interact with your medicine. What should I watch for while using this medicine? Visit your doctor or health care professional for regular checks on your progress. It may be several weeks before you see the full effects. Do not suddenly stop taking this medicine. You may need to gradually reduce the dose. Only stop taking this medicine on the advice of your doctor or health care professional. Dennis Bast may get dizzy or drowsy. Do not drive, use machinery, or do anything that needs mental alertness until you know how this medicine affects you. Do not stand or sit up quickly, especially if you are an older patient. This reduces the risk of dizzy or fainting spells. Alcohol can increase dizziness and drowsiness. Avoid alcoholic drinks. You can get a hangover effect the  morning after a bedtime  dose. Do not treat yourself for colds, diarrhea or allergies. Ask your doctor or health care professional for advice, some nonprescription medicines may increase possible side effects. This medicine can reduce the response of your body to heat or cold. Dress warm in cold weather and stay hydrated in hot weather. If possible, avoid extreme temperatures like saunas, hot tubs, very hot or cold showers, or activities that can cause dehydration such as vigorous exercise. What side effects may I notice from receiving this medicine? Side effects that you should report to your doctor or health care professional as soon as possible: -aching muscles and joints -confusion -fainting spells -fast or irregular heartbeat -fever or chills, sore throat -increased hunger or thirst -increased urination -loss of balance, difficulty walking or falls -stiffness, spasms, trembling -uncontrollable head, mouth, neck, arm, or leg movements -unusually weak or tired Side effects that usually do not require medical attention (report to your doctor or health care professional if they continue or are bothersome): -constipation -decreased sexual ability -difficulty sleeping -drowsiness or dizziness -increase or decrease in saliva -nausea, vomiting -weight gain This list may not describe all possible side effects. Call your doctor for medical advice about side effects. You may report side effects to FDA at 1-800-FDA-1088. Where should I keep my medicine? Keep out of the reach of children. Store at room temperature between 15 and 25 degrees C (59 and 77 degrees F). Protect from light. Throw away any unused medicine after the expiration date. NOTE: This sheet is a summary. It may not cover all possible information. If you have questions about this medicine, talk to your doctor, pharmacist, or health care provider.  2018 Elsevier/Gold Standard (2016-07-01 18:50:25)
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
## 2018-07-17 ENCOUNTER — Other Ambulatory Visit (HOSPITAL_COMMUNITY): Payer: BLUE CROSS/BLUE SHIELD

## 2018-07-20 ENCOUNTER — Other Ambulatory Visit (HOSPITAL_COMMUNITY): Payer: BLUE CROSS/BLUE SHIELD

## 2018-07-21 ENCOUNTER — Other Ambulatory Visit (HOSPITAL_COMMUNITY): Payer: BLUE CROSS/BLUE SHIELD

## 2018-07-22 ENCOUNTER — Encounter

## 2018-07-22 ENCOUNTER — Ambulatory Visit (INDEPENDENT_AMBULATORY_CARE_PROVIDER_SITE_OTHER): Payer: BLUE CROSS/BLUE SHIELD | Admitting: Licensed Clinical Social Worker

## 2018-07-22 DIAGNOSIS — F411 Generalized anxiety disorder: Secondary | ICD-10-CM | POA: Diagnosis not present

## 2018-07-23 ENCOUNTER — Other Ambulatory Visit (HOSPITAL_COMMUNITY): Payer: BLUE CROSS/BLUE SHIELD

## 2018-07-24 ENCOUNTER — Other Ambulatory Visit (HOSPITAL_COMMUNITY): Payer: BLUE CROSS/BLUE SHIELD

## 2018-07-28 ENCOUNTER — Other Ambulatory Visit (HOSPITAL_COMMUNITY): Payer: BLUE CROSS/BLUE SHIELD

## 2018-07-28 ENCOUNTER — Other Ambulatory Visit: Payer: Self-pay | Admitting: Internal Medicine

## 2018-07-28 DIAGNOSIS — K581 Irritable bowel syndrome with constipation: Secondary | ICD-10-CM

## 2018-07-28 MED ORDER — LINACLOTIDE 290 MCG PO CAPS: 290.0000 ug | ORAL_CAPSULE | Freq: Every day | ORAL | 3 refills | Status: DC

## 2018-07-29 ENCOUNTER — Other Ambulatory Visit (HOSPITAL_COMMUNITY): Payer: BLUE CROSS/BLUE SHIELD

## 2018-07-30 ENCOUNTER — Other Ambulatory Visit (HOSPITAL_COMMUNITY): Payer: BLUE CROSS/BLUE SHIELD

## 2018-07-31 ENCOUNTER — Other Ambulatory Visit (HOSPITAL_COMMUNITY): Payer: BLUE CROSS/BLUE SHIELD

## 2018-08-03 ENCOUNTER — Other Ambulatory Visit (HOSPITAL_COMMUNITY): Payer: BLUE CROSS/BLUE SHIELD

## 2018-08-04 ENCOUNTER — Other Ambulatory Visit (HOSPITAL_COMMUNITY): Payer: BLUE CROSS/BLUE SHIELD

## 2018-08-05 ENCOUNTER — Other Ambulatory Visit (HOSPITAL_COMMUNITY): Payer: BLUE CROSS/BLUE SHIELD

## 2018-08-06 ENCOUNTER — Encounter: Payer: Self-pay | Admitting: Psychiatry

## 2018-08-06 ENCOUNTER — Ambulatory Visit (INDEPENDENT_AMBULATORY_CARE_PROVIDER_SITE_OTHER): Payer: BLUE CROSS/BLUE SHIELD | Admitting: Psychiatry

## 2018-08-06 ENCOUNTER — Other Ambulatory Visit (HOSPITAL_COMMUNITY): Payer: BLUE CROSS/BLUE SHIELD

## 2018-08-06 ENCOUNTER — Other Ambulatory Visit: Payer: Self-pay

## 2018-08-06 VITALS — BP 121/74 | HR 76 | Temp 97.9°F | Wt 307.0 lb

## 2018-08-06 DIAGNOSIS — F411 Generalized anxiety disorder: Secondary | ICD-10-CM | POA: Diagnosis not present

## 2018-08-06 DIAGNOSIS — F4310 Post-traumatic stress disorder, unspecified: Secondary | ICD-10-CM | POA: Diagnosis not present

## 2018-08-06 DIAGNOSIS — F410 Panic disorder [episodic paroxysmal anxiety] without agoraphobia: Secondary | ICD-10-CM

## 2018-08-06 DIAGNOSIS — F4010 Social phobia, unspecified: Secondary | ICD-10-CM

## 2018-08-06 DIAGNOSIS — F331 Major depressive disorder, recurrent, moderate: Secondary | ICD-10-CM | POA: Diagnosis not present

## 2018-08-06 MED ORDER — LAMOTRIGINE 25 MG PO TABS
25.0000 mg | ORAL_TABLET | Freq: Every day | ORAL | 1 refills | Status: DC
Start: 2018-08-06 — End: 2018-09-07

## 2018-08-06 MED ORDER — BUSPIRONE HCL 15 MG PO TABS: 15.0000 mg | ORAL_TABLET | Freq: Three times a day (TID) | ORAL | 1 refills | Status: DC

## 2018-08-06 MED ORDER — RISPERIDONE 0.25 MG PO TABS: 0.2500 mg | ORAL_TABLET | Freq: Two times a day (BID) | ORAL | 1 refills | Status: DC

## 2018-08-06 MED ORDER — TRAZODONE HCL 50 MG PO TABS: 50.0000 mg | ORAL_TABLET | Freq: Every evening | ORAL | 1 refills | Status: DC | PRN

## 2018-08-06 MED ORDER — BUPROPION HCL ER (XL) 150 MG PO TB24: 150.0000 mg | ORAL_TABLET | Freq: Every day | ORAL | 1 refills | Status: DC
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
## 2018-08-07 ENCOUNTER — Other Ambulatory Visit (HOSPITAL_COMMUNITY): Payer: BLUE CROSS/BLUE SHIELD

## 2018-08-10 ENCOUNTER — Other Ambulatory Visit (HOSPITAL_COMMUNITY): Payer: BLUE CROSS/BLUE SHIELD

## 2018-08-11 ENCOUNTER — Other Ambulatory Visit (HOSPITAL_COMMUNITY): Payer: BLUE CROSS/BLUE SHIELD

## 2018-08-11 ENCOUNTER — Ambulatory Visit (INDEPENDENT_AMBULATORY_CARE_PROVIDER_SITE_OTHER): Payer: BLUE CROSS/BLUE SHIELD | Admitting: Licensed Clinical Social Worker

## 2018-08-11 DIAGNOSIS — F4310 Post-traumatic stress disorder, unspecified: Secondary | ICD-10-CM | POA: Diagnosis not present

## 2018-08-12 ENCOUNTER — Other Ambulatory Visit (HOSPITAL_COMMUNITY): Payer: BLUE CROSS/BLUE SHIELD

## 2018-08-13 ENCOUNTER — Other Ambulatory Visit (HOSPITAL_COMMUNITY): Payer: BLUE CROSS/BLUE SHIELD

## 2018-08-14 ENCOUNTER — Other Ambulatory Visit (HOSPITAL_COMMUNITY): Payer: BLUE CROSS/BLUE SHIELD

## 2018-08-26 ENCOUNTER — Encounter: Payer: Self-pay | Admitting: Obstetrics and Gynecology

## 2018-08-26 ENCOUNTER — Ambulatory Visit (INDEPENDENT_AMBULATORY_CARE_PROVIDER_SITE_OTHER): Payer: BLUE CROSS/BLUE SHIELD | Admitting: Licensed Clinical Social Worker

## 2018-08-26 ENCOUNTER — Other Ambulatory Visit (HOSPITAL_COMMUNITY)
Admission: RE | Admit: 2018-08-26 | Discharge: 2018-08-26 | Disposition: A | Discharge status: Home / Self Care | Payer: BLUE CROSS/BLUE SHIELD | Source: Ambulatory Visit | Attending: Obstetrics and Gynecology | Admitting: Obstetrics and Gynecology

## 2018-08-26 ENCOUNTER — Ambulatory Visit (INDEPENDENT_AMBULATORY_CARE_PROVIDER_SITE_OTHER): Payer: BLUE CROSS/BLUE SHIELD | Admitting: Obstetrics and Gynecology

## 2018-08-26 VITALS — BP 126/82 | HR 82 | Ht 65.0 in | Wt 302.0 lb

## 2018-08-26 DIAGNOSIS — F4310 Post-traumatic stress disorder, unspecified: Secondary | ICD-10-CM | POA: Diagnosis not present

## 2018-08-26 DIAGNOSIS — Z01419 Encounter for gynecological examination (general) (routine) without abnormal findings: Secondary | ICD-10-CM

## 2018-08-26 DIAGNOSIS — Z124 Encounter for screening for malignant neoplasm of cervix: Secondary | ICD-10-CM | POA: Insufficient documentation

## 2018-08-26 DIAGNOSIS — R7989 Other specified abnormal findings of blood chemistry: Secondary | ICD-10-CM

## 2018-08-26 DIAGNOSIS — N281 Cyst of kidney, acquired: Secondary | ICD-10-CM | POA: Diagnosis not present

## 2018-08-26 NOTE — Progress Notes (Signed)
Gynecology Annual Exam  PCP: McLean-Scocuzza, [name], MD  Chief Complaint:  Chief Complaint  Patient presents with  . Gynecologic Exam    Referred by [facility]    History of Present Illness:Patient is a 52 y.o. G1P1001 presents for annual exam. The patient has no complaints today.   LMP: Patient's last menstrual period was 05/21/2016. Postmenopausal no bleeding.    The patient is not sexually active. The patient does perform self breast exams.  There is no notable family history of breast or ovarian cancer in her family.  The patient wears seatbelts: yes.   The patient has regular exercise: not asked.    The patient reports current symptoms of depression.  Currently being followed by PCP   Review of Systems: Review of Systems  Constitutional: Negative for chills and fever.  HENT: Negative for congestion.   Respiratory: Negative for cough and shortness of breath.   Cardiovascular: Negative for chest pain and palpitations.  Gastrointestinal: Negative for abdominal pain, constipation, diarrhea, heartburn, nausea and vomiting.  Genitourinary: Negative for dysuria, frequency and urgency.  Skin: Negative for itching and rash.  Neurological: Negative for dizziness and headaches.  Endo/Heme/Allergies: Negative for polydipsia.  Psychiatric/Behavioral: Positive for depression. The patient is nervous/anxious.     Past Medical History:  Past Medical History:  Diagnosis Date  . Acid reflux   . Anemia, iron deficiency 09/22/2017  . Anxiety   . Arthritis   . Constipation   . Depression   . Fibroid 03/19/2018  . H/O Bell's palsy    right in 2003   . Hypertension   . Insomnia   . Morbid obesity ([place])   . Muscle spasm   . Nocturia   . Numbness in feet   . Weakness     Past Surgical History:  Past Surgical History:  Procedure Laterality Date  . ANTERIOR CERVICAL DECOMP/DISCECTOMY FUSION N/A 04/22/2018   Procedure: ANTERIOR CERVICAL DECOMPRESSION FUSION, CERVICAL FIVE-SIX,  CERVICAL SIX-SEVEN WITH INSTRUMENTATION AND ALLOGRAFT;  Surgeon: Phylliss Bob, MD;  Location: [facility];  Service: Orthopedics;  Laterality: N/A;  . CESAREAN SECTION    . NECK SURGERY      Gynecologic History:  Patient's last menstrual period was 05/21/2016. Last mammogram: 04/09/2018 Results were: BI-RAD I  Obstetric History: G1P1001  Family History:  Family History  Problem Relation Age of Onset  . Hypertension Mother   . Depression Mother   . Melanoma Mother   . Alzheimer's disease Father   . Diabetes Father   . Hypertension Father   . Drug abuse Brother   . Anxiety disorder Brother   . Depression Brother   . Breast cancer Neg Hx     Social History:  Social History   Socioeconomic History  . Marital status: Divorced    Spouse name: Not on file  . Number of children: 1  . Years of education: HS  . Highest education level: High school graduate  Occupational History  . 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 Financial resource strain: Not hard at all  . Food insecurity:    Worry: Never true    Inability: Never true  . Transportation needs:    Medical: No    Non-medical: No  Tobacco Use  . Smoking status: Never Smoker  . Smokeless tobacco: Never Used  Substance and Sexual Activity  . Alcohol use: No  . Drug use: No  . Sexual activity: Yes    Birth  control/protection: None  Lifestyle  . Physical activity:    Days per week: 0 days    Minutes per session: 0 min  . Stress: Very much  Relationships  . Social connections:    Talks on phone: Three times a week    Gets together: Three times a week    Attends religious service: Never    Active member of club or organization: No    Attends meetings of clubs or organizations: Never    Relationship status: Divorced  . Intimate partner violence:    Fear of current or ex partner: No    Emotionally abused: No    Physically abused: No    Forced sexual activity: No  Other Topics Concern    . Not on file  Social History Narrative   Lives at home with her son.   Right-handed.   8 cups tea and 1 can of [facility] per day.   As of 03/19/18 pt has disability pending and not working     Allergies:  No Known Allergies  Medications: Prior to Admission medications   Medication Sig Start Date End Date Taking? Authorizing Provider  Biotin 10000 MCG TABS Take 2 tablets by mouth.   Yes [provider]  buPROPion (WELLBUTRIN XL) 150 MG 24 hr tablet Take 1 tablet (150 mg total) by mouth daily. 08/06/18  Yes Ursula Alert, MD  busPIRone (BUSPAR) 15 MG tablet Take 1 tablet (15 mg total) by mouth 3 (three) times daily. 08/06/18  Yes Ursula Alert, MD  celecoxib (CELEBREX) 100 MG capsule TAKE 1 CAPSULE BY MOUTH 2 TIMES DAILY 05/11/18  Yes [place], Modena Nunnery, MD  Cholecalciferol 50000 units capsule Take 1 capsule (50,000 Units total) by mouth once a week. 03/19/18  Yes McLean-Scocuzza, [name], MD  clonazePAM (KLONOPIN) 0.5 MG tablet Take 1 tablet (0.5 mg total) by mouth daily as needed for anxiety. 06/18/18  Yes McLean-Scocuzza, [name], MD  DULoxetine (CYMBALTA) 30 MG capsule Total of  90mg  daily 04/06/18  Yes McLean-Scocuzza, [name], MD  DULoxetine (CYMBALTA) 60 MG capsule Take 90 mg daily (60 mg + 30 mg) 04/06/18  Yes McLean-Scocuzza, [name], MD  ferrous sulfate 325 (65 FE) MG tablet Take 1 tablet (325 mg total) 2 (two) times daily with a meal by mouth. 10/02/17  Yes [place], Modena Nunnery, MD  hydrochlorothiazide (HYDRODIURIL) 25 MG tablet TAKE 1 TABLET BY MOUTH ONCE A DAY 12/16/17  Yes [place], Modena Nunnery, MD  lamoTRIgine (LAMICTAL) 100 MG tablet Take 1 tablet (100 mg total) by mouth daily. 05/25/18  Yes Ursula Alert, MD  lamoTRIgine (LAMICTAL) 25 MG tablet Take 1 tablet (25 mg total) by mouth daily. To be combined with 100 mg 08/06/18  Yes Ursula Alert, MD  linaclotide (LINZESS) 290 MCG CAPS capsule Take 1 capsule (290 mcg total) by mouth daily before breakfast. 07/28/18  Yes  McLean-Scocuzza, [name], MD  losartan (COZAAR) 50 MG tablet TAKE 1 TABLET BY MOUTH ONCE A DAY FOR HIGH BLOOD PRESSURE. 03/19/18  Yes [place], Modena Nunnery, MD  mupirocin ointment (BACTROBAN) 2 % Apply 1 application topically 3 (three) times daily. Left foot 06/18/18  Yes McLean-Scocuzza, [name], MD  omeprazole (PRILOSEC) 20 MG capsule Take 1 capsule (20 mg total) by mouth at bedtime. 07/15/18  Yes McLean-Scocuzza, [name], MD  risperiDONE (RISPERDAL) 0.25 MG tablet Take 1 tablet (0.25 mg total) by mouth 2 (two) times daily. 08/06/18  Yes Ursula Alert, MD  tiZANidine (ZANAFLEX) 4 MG tablet Take 1  tablet (4 mg total) by mouth every 8 (eight) hours as needed for muscle spasms. 06/18/18  Yes McLean-Scocuzza, [name], MD  topiramate (TOPAMAX) 200 MG tablet Take 1 tablet (200 mg total) by mouth 2 (two) times daily. 12/02/17  Yes [place], Modena Nunnery, MD  traMADol (ULTRAM) 50 MG tablet Take 50-100 mg by mouth every 6 (six) hours as needed (Dr. Lynann Bologna).   Yes [provider]  traZODone (DESYREL) 50 MG tablet Take 1-2 tablets (50-100 mg total) by mouth at bedtime as needed. for sleep 08/06/18  Yes Ursula Alert, MD    Physical Exam Vitals: Blood pressure 126/82, pulse 82, height 5\' 5"  (1.651 m), weight (!) 302 lb (137 kg), last menstrual period 05/21/2016.  General: NAD HEENT: normocephalic, anicteric Thyroid: no enlargement, no palpable nodules Pulmonary: No increased work of breathing, CTAB Cardiovascular: RRR, distal pulses 2+ Breast: Breast symmetrical, no tenderness, no palpable nodules or masses, no skin or nipple retraction present, no nipple discharge.  No axillary or supraclavicular lymphadenopathy. Abdomen: NABS, soft, non-tender, non-distended.  Umbilicus without lesions.  No hepatomegaly, splenomegaly or masses palpable. No evidence of hernia  Genitourinary:  External: Normal external female genitalia.  Normal urethral meatus, normal Bartholin's and Skene's glands.    Vagina: Normal  vaginal mucosa, no evidence of prolapse.    Cervix: Grossly normal in appearance, no bleeding  Uterus: Non-enlarged, mobile, normal contour.  No CMT  Adnexa: ovaries non-enlarged, no adnexal masses  Rectal: deferred  Lymphatic: no evidence of inguinal lymphadenopathy Extremities: no edema, erythema, or tenderness Neurologic: Grossly intact Psychiatric: mood appropriate, affect full  Female chaperone present for pelvic and breast  portions of the physical exam     Assessment: 52 y.o. G1P1001 routine annual exam  Plan: Problem List Items Addressed This Visit    None    Visit Diagnoses    Encounter for gynecological examination without abnormal finding    -  Primary   Renal cyst, left       Relevant Orders   Ambulatory referral to Nephrology   Elevated serum creatinine       Relevant Orders   Ambulatory referral to Nephrology   Screening for malignant neoplasm of cervix       Relevant Orders   Cytology - PAP      1) Mammogram - recommend yearly screening mammogram.  Mammogram Is up to date  2) STI screening  wasoffered and declined  3) ASCCP guidelines and rational discussed.  Patient opts for every 3 years screening interval  4) Osteoporosis  - per USPTF routine screening DEXA at age 5  5) Routine healthcare maintenance including cholesterol, diabetes screening discussed managed by PCP  6) Colonoscopy waiting to get cleared after spinal fusion  7) Large left renal cysts with up trending serum creatinine in the past year.  -  nephrology referral   8)  Return in about 1 year (around 08/27/2019) for annual.    Malachy Mood, MD [place] Pigeon, [facility] 08/26/2018, 1:49 PM

## 2018-08-26 NOTE — Patient Instructions (Signed)
Polycystic Kidney Disease, Adult Polycystic kidney disease is a disease in which the kidneys grow many small fluid-filled cysts. These cysts squeeze healthy kidney tissue, hurting the function of the kidneys. Polycystic kidney disease is present at birth and often causes progressive loss of kidney function and high blood pressure (hypertension). In milder forms of the disease, kidney function may be adequate throughout life. What are the causes? The condition is usually inherited from parents. What are the signs or symptoms?  Hypertension.  Not enough healthy red blood cells to carry adequate oxygen to your tissues (anemia).  Pain in middle and side of the back below ribs (flank pain). This can happen if a cyst is bleeding.  Blood in the urine.  Kidney failure.  Kidney stones.  Increased urination at night.  Liver disease and liver cysts. How is this diagnosed? Your health care provider will ask you about your history and symptoms and perform a physical exam. Tests may be done to diagnose the condition. They may include an ultrasound, a CT scan, or an MRI scan. Sometimes chromosome studies are done to see if you have the genes to have polycystic kidneys. How is this treated?  There is no treatment at this time to keep cysts from forming or getting larger.  Cysts may need to be drained with a needle if they are large, putting pressure on other organs, and further destroying kidney tissue. This may require surgery.  Hypertension may be treated with medicines. Treating hypertension slows down further damage to the kidneys.  If kidney failure occurs, dialysis or transplantation is used to treat the disease. Follow these instructions at home: You should follow up with a kidney specialist (nephrologist) so that your kidney function is monitored. Contact a health care provider if:  You have severe pain in the flank area.  You have blood in your urine. This information is not intended  to replace advice given to you by your health care provider. Make sure you discuss any questions you have with your health care provider. Document Released: 08/06/2001 Document Revised: 04/18/2016 Document Reviewed: 04/15/2013 Elsevier Interactive Patient Education  2017 [employer].

## 2018-08-28 LAB — CYTOLOGY - PAP
Diagnosis: NEGATIVE
HPV: NOT DETECTED

## 2018-08-31 NOTE — Progress Notes (Signed)
   THERAPIST PROGRESS NOTE  Session Time: 81mn  Participation Level: Active  Behavioral Response: CasualAlertEuthymic  Type of Therapy: Individual Therapy  Treatment Goals addressed: Anxiety  Interventions: CBT and Motivational Interviewing  Summary: Erika ZWILLINGis a 52y.o. female who presents with continued symptoms of her diagnosis. Therapist met with Patient in an outpatient setting to assess current mood and assist with making progress towards goals through the use of therapeutic intervention. Therapist did a brief mood check, assessing anger, fear, disgust, excitement, happiness, and sadness.  Reports a "Favorable" mood.  Provided support for Patient as she discussed her current mood. Stressed the importance of mood stabilization through learned coping skills and medication management.  Encouraged Patient to focus on her strengths and the positive aspects.   Suicidal/Homicidal: No   Plan: Return again in 2 weeks.  Diagnosis: Axis I: Generalized Anxiety Disorder    Axis II: No diagnosis    NLubertha South LCSW 07/22/2018

## 2018-09-01 ENCOUNTER — Other Ambulatory Visit: Payer: Self-pay | Admitting: Family Medicine
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
## 2018-09-07 ENCOUNTER — Other Ambulatory Visit: Payer: Self-pay

## 2018-09-07 ENCOUNTER — Ambulatory Visit (INDEPENDENT_AMBULATORY_CARE_PROVIDER_SITE_OTHER): Payer: BLUE CROSS/BLUE SHIELD | Admitting: Psychiatry

## 2018-09-07 ENCOUNTER — Encounter: Payer: Self-pay | Admitting: Psychiatry

## 2018-09-07 ENCOUNTER — Telehealth: Payer: Self-pay

## 2018-09-07 VITALS — BP 141/83 | HR 74 | Temp 97.3°F | Wt 296.4 lb

## 2018-09-07 DIAGNOSIS — F4310 Post-traumatic stress disorder, unspecified: Secondary | ICD-10-CM | POA: Diagnosis not present

## 2018-09-07 DIAGNOSIS — F410 Panic disorder [episodic paroxysmal anxiety] without agoraphobia: Secondary | ICD-10-CM

## 2018-09-07 DIAGNOSIS — F411 Generalized anxiety disorder: Secondary | ICD-10-CM

## 2018-09-07 DIAGNOSIS — F331 Major depressive disorder, recurrent, moderate: Secondary | ICD-10-CM

## 2018-09-07 DIAGNOSIS — F4010 Social phobia, unspecified: Secondary | ICD-10-CM

## 2018-09-07 MED ORDER — LAMOTRIGINE 100 MG PO TABS: 150.0000 mg | ORAL_TABLET | Freq: Every day | ORAL | 1 refills | Status: DC

## 2018-09-07 MED ORDER — RISPERIDONE 0.5 MG PO TABS: 0.2500 mg | ORAL_TABLET | ORAL | 0 refills | Status: DC

## 2018-09-07 MED ORDER — TRAZODONE HCL 150 MG PO TABS: 150.0000 mg | ORAL_TABLET | Freq: Every day | ORAL | 1 refills | Status: DC
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
## 2018-09-07 NOTE — Telephone Encounter (Signed)
received fax that a prior authorization is neededed for risperidone.5mg 

## 2018-09-07 NOTE — Telephone Encounter (Signed)
went online to [email or web address] and did the prior authorization for medicaiton.- pending review.

## 2018-09-07 NOTE — Telephone Encounter (Signed)
faxed and confirmed approval notice for the risperidone .5mg 

## 2018-09-07 NOTE — Telephone Encounter (Signed)
received notice that prior auth was approved from  09-07-18 to  09-05-2021

## 2018-09-09 ENCOUNTER — Ambulatory Visit: Payer: BLUE CROSS/BLUE SHIELD | Admitting: Licensed Clinical Social Worker

## 2018-09-11 ENCOUNTER — Other Ambulatory Visit: Payer: Self-pay | Admitting: Family Medicine

## 2018-09-21 ENCOUNTER — Encounter: Payer: Self-pay | Admitting: Psychiatry

## 2018-09-21 ENCOUNTER — Other Ambulatory Visit: Payer: Self-pay

## 2018-09-21 ENCOUNTER — Ambulatory Visit (INDEPENDENT_AMBULATORY_CARE_PROVIDER_SITE_OTHER): Payer: BLUE CROSS/BLUE SHIELD | Admitting: Psychiatry

## 2018-09-21 VITALS — BP 121/77 | HR 66 | Temp 97.7°F | Wt 299.4 lb

## 2018-09-21 DIAGNOSIS — F331 Major depressive disorder, recurrent, moderate: Secondary | ICD-10-CM

## 2018-09-21 DIAGNOSIS — F4010 Social phobia, unspecified: Secondary | ICD-10-CM

## 2018-09-21 DIAGNOSIS — F4310 Post-traumatic stress disorder, unspecified: Secondary | ICD-10-CM | POA: Diagnosis not present

## 2018-09-21 DIAGNOSIS — F411 Generalized anxiety disorder: Secondary | ICD-10-CM | POA: Diagnosis not present

## 2018-09-21 DIAGNOSIS — F410 Panic disorder [episodic paroxysmal anxiety] without agoraphobia: Secondary | ICD-10-CM

## 2018-09-21 MED ORDER — CLONAZEPAM 0.5 MG PO TABS: 0.2500 mg | ORAL_TABLET | ORAL | 0 refills | Status: DC

## 2018-09-21 MED ORDER — PROPRANOLOL HCL 10 MG PO TABS: 10.0000 mg | ORAL_TABLET | Freq: Three times a day (TID) | ORAL | 1 refills | Status: DC | PRN

## 2018-09-21 NOTE — Progress Notes (Signed)
[facility] MD OP Progress Note  09/21/2018 12:56 PM Erika Hanson  MRN:  [id number]  Chief Complaint: ' I am here for follow up." Chief Complaint    Follow-up; Weight Gain     HPI: Erika Hanson is a 52 year old Caucasian female, lives in [place], has a history of depression, anxiety disorder, somatoform disorder, functional neurological symptom disorder, morbid obesity, vitamin D, folate and iron deficiency, chronic pain, presented to the clinic today for a follow-up visit.  Patient today reports that she continues to struggle with some anxiety symptoms on and off.  She reports she hence uses Klonopin when she feels that way.  She however reports she does not use it every day and has been using it 3-4 times per week.  She is aware about the risk of being on benzodiazepine therapy long-term.  Discussed adding propranolol as needed and discussed to limit the use of Klonopin.  She agrees with plan.  Patient reports she is compliant on her other medications as prescribed.  She reports some fatigue, tiredness, restlessness at night and so on which she does not know if it is due to her medications or due to her recent renal problems.  She has upcoming appointment with her provider.  She continues to be in psychotherapy which is going well.  Patient denies any other concerns today.  Visit Diagnosis:    ICD-10-CM   1. PTSD (post-traumatic stress disorder) F43.10   2. GAD (generalized anxiety disorder) F41.1   3. MDD (major depressive disorder), recurrent episode, moderate (HCC) F33.1   4. Social anxiety disorder F40.10 clonazePAM (KLONOPIN) 0.5 MG tablet    propranolol (INDERAL) 10 MG tablet  5. Panic attacks F41.0 clonazePAM (KLONOPIN) 0.5 MG tablet    propranolol (INDERAL) 10 MG tablet    Past Psychiatric History: Have reviewed past psychiatric history from my progress note on 02/10/2018.  Past trials of Paxil, Celexa, Cymbalta  Past Medical History:  Past Medical History:  Diagnosis Date  . Acid reflux    . Anemia, iron deficiency 09/22/2017  . Anxiety   . Arthritis   . Constipation   . Depression   . Fibroid 03/19/2018  . H/O Bell's palsy    right in 2003   . Hypertension   . Insomnia   . Morbid obesity ([place])   . Muscle spasm   . Nocturia   . Numbness in feet   . Weakness     Past Surgical History:  Procedure Laterality Date  . ANTERIOR CERVICAL DECOMP/DISCECTOMY FUSION N/A 04/22/2018   Procedure: ANTERIOR CERVICAL DECOMPRESSION FUSION, CERVICAL FIVE-SIX, CERVICAL SIX-SEVEN WITH INSTRUMENTATION AND ALLOGRAFT;  Surgeon: Phylliss Bob, MD;  Location: [facility];  Service: Orthopedics;  Laterality: N/A;  . CESAREAN SECTION    . NECK SURGERY      Family Psychiatric History: Reviewed family psychiatric history from my progress note on 02/10/2018  Family History:  Family History  Problem Relation Age of Onset  . Hypertension Mother   . Depression Mother   . Melanoma Mother   . Alzheimer's disease Father   . Diabetes Father   . Hypertension Father   . Drug abuse Brother   . Anxiety disorder Brother   . Depression Brother   . Breast cancer Neg Hx     Social History: I have reviewed social history from my progress note on 02/10/2018 Social History   Socioeconomic History  . Marital status: Divorced    Spouse name: Not on file  . Number of children: 1  .  Years of education: HS  . Highest education level: High school graduate  Occupational History  . 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 Financial resource strain: Not hard at all  . Food insecurity:    Worry: Never true    Inability: Never true  . Transportation needs:    Medical: No    Non-medical: No  Tobacco Use  . Smoking status: Never Smoker  . Smokeless tobacco: Never Used  Substance and Sexual Activity  . Alcohol use: No  . Drug use: No  . Sexual activity: Yes    Birth control/protection: None  Lifestyle  . Physical activity:    Days per week: 0 days    Minutes per session:  0 min  . Stress: Very much  Relationships  . Social connections:    Talks on phone: Three times a week    Gets together: Three times a week    Attends religious service: Never    Active member of club or organization: No    Attends meetings of clubs or organizations: Never    Relationship status: Divorced  Other Topics Concern  . Not on file  Social History Narrative   Lives at home with her son.   Right-handed.   8 cups tea and 1 can of [facility] per day.   As of 03/19/18 pt has disability pending and not working     Allergies: No Known Allergies  Metabolic Disorder Labs: Lab Results  Component Value Date   HGBA1C 5.3 12/30/2017   MPG 105 12/30/2017   MPG 120 05/21/2017   No results found for: PROLACTIN Lab Results  Component Value Date   CHOL 219 (H) 03/20/2018   TRIG 108 03/20/2018   HDL 69 03/20/2018   CHOLHDL 3.2 03/20/2018   VLDL 29 05/21/2017   LDLCALC 128 (H) 03/20/2018   LDLCALC 135 (H) 05/21/2017   Lab Results  Component Value Date   TSH 2.74 03/20/2018   TSH 4.060 02/17/2017    Therapeutic Level Labs: No results found for: LITHIUM No results found for: VALPROATE No components found for:  CBMZ  Current Medications: Current Outpatient Medications  Medication Sig Dispense Refill  . albuterol (PROVENTIL HFA;VENTOLIN HFA) 108 (90 Base) MCG/ACT inhaler Inhale into the lungs.    . Biotin 10000 MCG TABS Take 2 tablets by mouth.    Marland Kitchen buPROPion (WELLBUTRIN XL) 150 MG 24 hr tablet Take 1 tablet (150 mg total) by mouth daily. 90 tablet 1  . busPIRone (BUSPAR) 15 MG tablet Take 1 tablet (15 mg total) by mouth 3 (three) times daily. 270 tablet 1  . celecoxib (CELEBREX) 100 MG capsule TAKE 1 CAPSULE BY MOUTH TWICE DAILY 60 capsule 3  . Cholecalciferol 50000 units capsule Take 1 capsule (50,000 Units total) by mouth once a week. 13 capsule 1  . clonazePAM (KLONOPIN) 0.5 MG tablet Take 0.5-1 tablets (0.25-0.5 mg total) by mouth as directed. Take half to one  tablet as needed only for severe anxiety attacks. 20 tablet 0  . DULoxetine (CYMBALTA) 30 MG capsule Total of  90mg  daily 30 capsule 11  . DULoxetine (CYMBALTA) 60 MG capsule Take 90 mg daily (60 mg + 30 mg) 30 capsule 11  . ferrous sulfate 325 (65 FE) MG tablet Take 1 tablet (325 mg total) 2 (two) times daily with a meal by mouth.    . hydrochlorothiazide (HYDRODIURIL) 25 MG tablet TAKE 1 TABLET BY MOUTH ONCE DAILY 30 tablet 6  .  lamoTRIgine (LAMICTAL) 100 MG tablet Take 1.5 tablets (150 mg total) by mouth daily. 135 tablet 1  . linaclotide (LINZESS) 290 MCG CAPS capsule Take 1 capsule (290 mcg total) by mouth daily before breakfast. 90 capsule 3  . losartan (COZAAR) 50 MG tablet TAKE 1 TABLET BY MOUTH ONCE A DAY FOR HIGH BLOOD PRESSURE. 60 tablet 6  . mupirocin ointment (BACTROBAN) 2 % Apply 1 application topically 3 (three) times daily. Left foot 30 g 0  . omeprazole (PRILOSEC) 20 MG capsule Take 1 capsule (20 mg total) by mouth at bedtime. 90 capsule 1  . propranolol (INDERAL) 10 MG tablet Take 1 tablet (10 mg total) by mouth 3 (three) times daily as needed. For severe anxiety attacks 90 tablet 1  . risperiDONE (RISPERDAL) 0.5 MG tablet Take 0.5-1 tablets (0.25-0.5 mg total) by mouth as directed. Take 0.5 mg twice a day and 0.25 mg as needed daily for agitation 225 tablet 0  . tiZANidine (ZANAFLEX) 4 MG tablet Take 1 tablet (4 mg total) by mouth every 8 (eight) hours as needed for muscle spasms. 90 tablet 11  . topiramate (TOPAMAX) 200 MG tablet TAKE 1 TABLET BY MOUTH TWICE A DAY 180 tablet 2  . traMADol (ULTRAM) 50 MG tablet Take 50-100 mg by mouth every 6 (six) hours as needed (Dr. Lynann Bologna).    Marland Kitchen traZODone (DESYREL) 150 MG tablet Take 1 tablet (150 mg total) by mouth at bedtime. sleep 90 tablet 1  . Vitamin D, Ergocalciferol, (DRISDOL) 50000 units CAPS capsule      No current facility-administered medications for this visit.      Musculoskeletal: Strength & Muscle Tone: within normal  limits Gait & Station: normal Patient leans: N/A  Psychiatric Specialty Exam: Review of Systems  Psychiatric/Behavioral: Positive for depression. The patient is nervous/anxious.   All other systems reviewed and are negative.   Blood pressure 121/77, pulse 66, temperature 97.7 F (36.5 C), temperature source Oral, weight 299 lb 6.4 oz (135.8 kg), last menstrual period 05/21/2016.Body mass index is 49.82 kg/m.  General Appearance: Casual  Eye Contact:  Fair  Speech:  Clear and Coherent  Volume:  Normal  Mood:  Anxious and Dysphoric  Affect:  Congruent  Thought Process:  Goal Directed and Descriptions of Associations: Intact  Orientation:  Full (Time, Place, and Person)  Thought Content: Logical   Suicidal Thoughts:  No  Homicidal Thoughts:  No  Memory:  Immediate;   Fair Recent;   Fair Remote;   Fair  Judgement:  Fair  Insight:  Fair  Psychomotor Activity:  Normal  Concentration:  Concentration: Fair and Attention Span: Fair  Recall:  [employer] of Knowledge: Fair  Language: Fair  Akathisia:  No  Handed:  Right  AIMS (if indicated): Denies tremors, rigidity  Assets:  Communication Skills Desire for Improvement Social Support  ADL's:  Intact  Cognition: WNL  Sleep:  restless at times   Screenings: AIMS     Office Visit from 08/06/2018 in [facility] Total Score  0    PHQ2-9     Office Visit from 06/18/2018 in [facility] Office Visit from 03/19/2018 in [facility] Office Visit from 12/02/2017 in [facility] Visit from 09/22/2017 in [facility] Visit from 08/27/2017 in [facility]  PHQ-2 Total Score  0  0  0  0  0       Assessment and Plan:Erika Hanson is  a 52 yr old Caucasian female, has a history of depression, anxiety, PTSD, functional neurological symptom disorder per history, presented to the clinic today for a follow-up visit.  Patient  continues to struggle with psychosocial stressors of multiple medical problems, her recent kidney problems.  She however continues to be in psychotherapy sessions.  She continues to be compliant with medications.  We will continue to make changes as noted below.  Plan Depression Cymbalta 90 mg p.o. daily.  Initiated per neurology. Wellbutrin 150 mg p.o. daily Trazodone 150 mg p.o. nightly Lamictal 150 mg p.o. daily Risperidone 0.5 mg p.o. twice daily and 0.25 mg p.o. daily as needed for agitation  For anxiety BuSpar 15 mg p.o. 3 times daily Change Klonopin to 0.25 - 0.5 mg as needed only for severe anxiety attacks.  Patient advised about limiting use and the risk of being on long-term benzodiazepine therapy. Add propranolol 10 mg p.o. 3 times daily as needed for severe anxiety symptoms I have reviewed [place] controlled substance database.  For PTSD She will continue psychotherapy sessions.  History of functional neurological symptom disorder I have reviewed medical records.  Her neurologist diagnosed her with the same per report however could not find a specific diagnosis documented in her [place] [facility].  Patient never had EEG done, hence cannot rule out true seizures versus psychogenic spells.    Patient continues to be under the care of her primary medical doctor for vitamin D deficiency, iron deficiency, folate deficiency, morbid obesity.  Will continue psychotherapy sessions with Erika Hanson.  Follow-up in clinic in 4 weeks or sooner if needed.  More than 50 % of the time was spent for psychoeducation and supportive psychotherapy and care coordination.  This note was generated in part or whole with voice recognition software. Voice recognition is usually quite accurate but there are transcription errors that can and very often do occur. I apologize for any typographical errors that were not detected and corrected.       Ursula Alert, MD 09/21/2018, 12:56 PM

## 2018-09-21 NOTE — Progress Notes (Signed)
   THERAPIST PROGRESS NOTE  Session Time: 560 min  Participation Level: Active  Behavioral Response: CasualAlertDepressed  Type of Therapy: Individual Therapy  Treatment Goals addressed: Coping and Diagnosis: PTSD  Interventions: CBT and Motivational Interviewing  Summary: Erika Hanson is a 52 y.o. female who presents with continued symptoms of her diagnosis.  Patient vented her frustration about her stressors.  Patient reports mood as "Favorable most days."  Patient listed how she was able to establish favorable most days.  Discussion of the long term effects of PTSD.  Educated patient on PTSD and discussed symptoms and how she can use deep breathing and mindfulness to reduce stressors and triggers.   Suicidal/Homicidal: No  Plan: Return again in 2 weeks.  Diagnosis: Axis I: Post Traumatic Stress Disorder    Axis II: No diagnosis    Lubertha South, LCSW 09/13/2018

## 2018-09-21 NOTE — Patient Instructions (Signed)
Propranolol tablets  What is this medicine?  PROPRANOLOL (proe PRAN oh lole) is a beta-blocker. Beta-blockers reduce the workload on the heart and help it to beat more regularly. This medicine is used to treat high blood pressure, to control irregular heart rhythms (arrhythmias) and to relieve chest pain caused by angina. It may also be helpful after a heart attack. This medicine is also used to prevent migraine headaches, relieve uncontrollable shaking (tremors), and help certain problems related to the thyroid gland and adrenal gland.  This medicine may be used for other purposes; ask your health care provider or pharmacist if you have questions.  COMMON BRAND NAME(S): Inderal  What should I tell my health care provider before I take this medicine?  They need to know if you have any of these conditions:  -circulation problems or blood vessel disease  -diabetes  -history of heart attack or heart disease, vasospastic angina  -kidney disease  -liver disease  -lung or breathing disease, like asthma or emphysema  -pheochromocytoma  -slow heart rate  -thyroid disease  -an unusual or allergic reaction to propranolol, other beta-blockers, medicines, foods, dyes, or preservatives  -pregnant or trying to get pregnant  -breast-feeding  How should I use this medicine?  Take this medicine by mouth with a glass of water. Follow the directions on the prescription label. Take your doses at regular intervals. Do not take your medicine more often than directed. Do not stop taking except on your the advice of your doctor or health care professional.  Talk to your pediatrician regarding the use of this medicine in children. Special care may be needed.  Overdosage: If you think you have taken too much of this medicine contact a poison control center or emergency room at once.  NOTE: This medicine is only for you. Do not share this medicine with others.  What if I miss a dose?  If you miss a dose, take it as soon as you can. If it is  almost time for your next dose, take only that dose. Do not take double or extra doses.  What may interact with this medicine?  Do not take this medicine with any of the following medications:  -feverfew  -phenothiazines like chlorpromazine, mesoridazine, prochlorperazine, thioridazine  This medicine may also interact with the following medications:  -aluminum hydroxide gel  -antipyrine  -antiviral medicines for HIV or AIDS  -barbiturates like phenobarbital  -certain medicines for blood pressure, heart disease, irregular heart beat  -cimetidine  -ciprofloxacin  -diazepam  -fluconazole  -haloperidol  -isoniazid  -medicines for cholesterol like cholestyramine or colestipol  -medicines for mental depression  -medicines for migraine headache like almotriptan, eletriptan, frovatriptan, naratriptan, rizatriptan, sumatriptan, zolmitriptan  -NSAIDs, medicines for pain and inflammation, like ibuprofen or naproxen  -phenytoin  -rifampin  -teniposide  -theophylline  -thyroid medicines  -tolbutamide  -warfarin  -zileuton  This list may not describe all possible interactions. Give your health care provider a list of all the medicines, herbs, non-prescription drugs, or dietary supplements you use. Also tell them if you smoke, drink alcohol, or use illegal drugs. Some items may interact with your medicine.  What should I watch for while using this medicine?  Visit your doctor or health care professional for regular check ups. Check your blood pressure and pulse rate regularly. Ask your health care professional what your blood pressure and pulse rate should be, and when you should contact them.  You may get drowsy or dizzy. Do not drive, use machinery, or   you have diabetes, check with your doctor or health care professional before you change your diet or the dose of your diabetic medicine. Do not treat yourself for coughs, colds, or pain while you are taking this medicine without asking your doctor or health care professional for advice. Some ingredients may increase your blood pressure. What side effects may I notice from receiving this medicine? Side effects that you should report to your doctor or health care professional as soon as possible: -allergic reactions like skin rash, itching or hives, swelling of the face, lips, or tongue -breathing problems -changes in blood sugar -cold hands or feet -difficulty sleeping, nightmares -dry peeling skin -hallucinations -muscle cramps or weakness -slow heart rate -swelling of the legs and ankles -vomiting Side effects that usually do not require medical attention (report to your doctor or health care professional if they continue or are bothersome): -change in sex drive or performance -diarrhea -dry sore eyes -hair loss -nausea -weak or tired This list may not describe all possible side effects. Call your doctor for medical advice about side effects. You may report side effects to FDA at 1-800-FDA-1088. Where should I keep my medicine? Keep out of the reach of children. Store at room temperature between 15 and 30 degrees C (59 and 86 degrees F). Protect from light. Throw away any unused medicine after the expiration date. NOTE: This sheet is a summary. It may not cover all possible information. If you have questions about this medicine, talk to your doctor, pharmacist, or health care provider.  2018 Elsevier/Gold Standard (2013-07-16 14:51:53)

## 2018-09-23 ENCOUNTER — Other Ambulatory Visit: Payer: Self-pay | Admitting: Nephrology

## 2018-09-23 DIAGNOSIS — N183 Chronic kidney disease, stage 3 unspecified: Secondary | ICD-10-CM

## 2018-09-28 ENCOUNTER — Other Ambulatory Visit: Payer: Self-pay | Admitting: Internal Medicine

## 2018-09-28 DIAGNOSIS — E559 Vitamin D deficiency, unspecified: Secondary | ICD-10-CM

## 2018-09-29 ENCOUNTER — Ambulatory Visit (INDEPENDENT_AMBULATORY_CARE_PROVIDER_SITE_OTHER): Payer: BLUE CROSS/BLUE SHIELD | Admitting: Licensed Clinical Social Worker

## 2018-09-29 DIAGNOSIS — F4310 Post-traumatic stress disorder, unspecified: Secondary | ICD-10-CM

## 2018-10-01 ENCOUNTER — Ambulatory Visit
Admission: RE | Admit: 2018-10-01 | Discharge: 2018-10-01 | Disposition: A | Discharge status: Home / Self Care | Payer: BLUE CROSS/BLUE SHIELD | Source: Ambulatory Visit | Attending: Nephrology | Admitting: Nephrology

## 2018-10-01 DIAGNOSIS — N183 Chronic kidney disease, stage 3 unspecified: Secondary | ICD-10-CM

## 2018-10-02 NOTE — Progress Notes (Signed)
   THERAPIST PROGRESS NOTE  Session Time: 46mn  Participation Level: Active  Behavioral Response: CasualAlertEuthymic  Type of Therapy: Individual Therapy  Treatment Goals addressed: Coping  Interventions: CBT and Motivational Interviewing  Summary: Erika GRILLIis a 52y.o. female who presents with a reduction in symptoms.  Therapist met with Patient in an outpatient setting to assess current mood and assist with making progress towards goals through the use of therapeutic intervention. Therapist did a brief mood check, assessing anger, fear, disgust, excitement, happiness, and sadness.  Patient reports "favorable" mood.  Patient verbalized her frustration about living with her mother.  Normalized feelings and assisted with expectations of living in the home.  Patient reports that she wants to purchase a home for herself in the near future but lacks money. Discussion of her self esteem. Inadequate pattern of communication with partner, family members,  leading to frequent arguing and conflicts.    Suicidal/Homicidal: No  Plan: Return again in 2 weeks.  Diagnosis: Axis I: Post Traumatic Stress Disorder    Axis II: No diagnosis    NLubertha South LCSW 09/29/2018

## 2018-10-20 ENCOUNTER — Ambulatory Visit: Payer: BLUE CROSS/BLUE SHIELD | Admitting: Psychiatry

## 2018-10-20 ENCOUNTER — Encounter: Payer: Self-pay | Admitting: Internal Medicine

## 2018-10-20 ENCOUNTER — Ambulatory Visit (INDEPENDENT_AMBULATORY_CARE_PROVIDER_SITE_OTHER): Payer: BLUE CROSS/BLUE SHIELD | Admitting: Internal Medicine

## 2018-10-20 VITALS — BP 126/84 | HR 70 | Temp 97.9°F | Ht 65.0 in | Wt 290.6 lb

## 2018-10-20 DIAGNOSIS — R251 Tremor, unspecified: Secondary | ICD-10-CM

## 2018-10-20 DIAGNOSIS — F4010 Social phobia, unspecified: Secondary | ICD-10-CM

## 2018-10-20 DIAGNOSIS — G894 Chronic pain syndrome: Secondary | ICD-10-CM

## 2018-10-20 DIAGNOSIS — E559 Vitamin D deficiency, unspecified: Secondary | ICD-10-CM

## 2018-10-20 DIAGNOSIS — F4310 Post-traumatic stress disorder, unspecified: Secondary | ICD-10-CM | POA: Insufficient documentation

## 2018-10-20 DIAGNOSIS — F331 Major depressive disorder, recurrent, moderate: Secondary | ICD-10-CM | POA: Diagnosis not present

## 2018-10-20 DIAGNOSIS — G8929 Other chronic pain: Secondary | ICD-10-CM

## 2018-10-20 DIAGNOSIS — R6889 Other general symptoms and signs: Secondary | ICD-10-CM

## 2018-10-20 DIAGNOSIS — R5383 Other fatigue: Secondary | ICD-10-CM

## 2018-10-20 DIAGNOSIS — N183 Chronic kidney disease, stage 3 unspecified: Secondary | ICD-10-CM

## 2018-10-20 DIAGNOSIS — F411 Generalized anxiety disorder: Secondary | ICD-10-CM | POA: Diagnosis not present

## 2018-10-20 DIAGNOSIS — E785 Hyperlipidemia, unspecified: Secondary | ICD-10-CM | POA: Insufficient documentation

## 2018-10-20 DIAGNOSIS — M2550 Pain in unspecified joint: Secondary | ICD-10-CM

## 2018-10-20 DIAGNOSIS — I7300 Raynaud's syndrome without gangrene: Secondary | ICD-10-CM

## 2018-10-20 DIAGNOSIS — M5412 Radiculopathy, cervical region: Secondary | ICD-10-CM

## 2018-10-20 HISTORY — DX: Raynaud's syndrome without gangrene: I73.00

## 2018-10-20 HISTORY — DX: Tremor, unspecified: R25.1

## 2018-10-20 MED ORDER — TRAMADOL HCL 50 MG PO TABS: 100.0000 mg | ORAL_TABLET | Freq: Every day | ORAL | 2 refills | Status: DC | PRN

## 2018-10-20 NOTE — Patient Instructions (Addendum)
d3 5000 IU daily   Essential Tremor A tremor is trembling or shaking that you cannot control. Most tremors affect the hands or arms. Tremors can also affect the head, vocal cords, face, and other parts of the body. Essential tremor is a tremor without a known cause. What are the causes? Essential tremor has no known cause. What increases the risk? You may be at greater risk of essential tremor if:  You have a family member with essential tremor.  You are age 97 or older.  You take certain medicines.  What are the signs or symptoms? The main sign of a tremor is uncontrolled and unintentional rhythmic shaking of a body part.  You may have difficulty eating with a spoon or fork.  You may have difficulty writing.  You may nod your head up and down or side to side.  You may have a quivering voice.  Your tremors:  May get worse over time.  May come and go.  May be more noticeable on one side of your body.  May get worse due to stress, fatigue, caffeine, and extreme heat or cold.  How is this diagnosed? Your health care provider can diagnose essential tremor based on your symptoms, medical history, and a physical examination. There is no single test to diagnose an essential tremor. However, your health care provider may perform a variety of tests to rule out other conditions. Tests may include:  Blood and urine tests.  Imaging studies of your brain, such as: ? CT scan. ? MRI.  A test that measures involuntary muscle movement (electromyogram).  How is this treated? Your tremors may go away without treatment. Mild tremors may not need treatment if they do not affect your day-to-day life. Severe tremors may need to be treated using one or a combination of the following options:  Medicines. This may include medicine that is injected.  Lifestyle changes.  Physical therapy.  Follow these instructions at home:  Take medicines only as directed by your health care  provider.  Limit alcohol intake to no more than 1 drink per day for nonpregnant women and 2 drinks per day for men. One drink equals 12 oz of beer, 5 oz of wine, or 1 oz of hard liquor.  Do not use any tobacco products, including cigarettes, chewing tobacco, or electronic cigarettes. If you need help quitting, ask your health care provider.  Take medicines only as directed by your health care provider.  Avoid extreme heat or cold.  Limit the amount of caffeine you consumeas directed by your health care provider.  Try to get eight hours of sleep each night.  Find ways to manage your stress, such as meditation or yoga.  Keep all follow-up visits as directed by your health care provider. This is important. This includes any physical therapy visits. Contact a health care provider if:  You experience any changes in the location or intensity of your tremors.  You start having a tremor after starting a new medicine.  You have tremor with other symptoms such as: ? Numbness. ? Tingling. ? Pain. ? Weakness.  Your tremor gets worse.  Your tremor interferes with your daily life. This information is not intended to replace advice given to you by your health care provider. Make sure you discuss any questions you have with your health care provider. Document Released: 12/02/2014 Document Revised: 04/18/2016 Document Reviewed: 05/09/2014 Elsevier Interactive Patient Education  2018 [place] phenomenon is a condition that affects  the blood vessels (arteries) that carry blood to your fingers and toes. The arteries that supply blood to your ears or the tip of your nose might also be affected. Raynaud phenomenon causes the arteries to temporarily narrow. As a result, the flow of blood to the affected areas is temporarily decreased. This usually occurs in response to cold temperatures or stress. During an attack, the skin in the affected areas turns white. You may  also feel tingling or numbness in those areas. Attacks usually last for only a brief period, and then the blood flow to the area returns to normal. In most cases, Raynaud phenomenon does not cause serious health problems. What are the causes? For many people with this condition, the cause is not known. Raynaud phenomenon is sometimes associated with other diseases, such as scleroderma or lupus. What increases the risk? Raynaud phenomenon can affect anyone, but it develops most often in people who are 57-10 years old. It affects more females than males. What are the signs or symptoms? Symptoms of Raynaud phenomenon may occur when you are exposed to cold temperatures or when you have emotional stress. The symptoms may last for a few minutes or up to several hours. They usually affect your fingers but may also affect your toes, ears, or the tip of your nose. Symptoms may include:  Changes in skin color. The skin in the affected areas will turn pale or white. The skin may then change from white to bluish to red as normal blood flow returns to the area.  Numbness, tingling, or pain in the affected areas.  In severe cases, sores may develop in the affected areas. How is this diagnosed? Your health care provider will do a physical exam and take your medical history. You may be asked to put your hands in cold water to check for a reaction to cold temperature. Blood tests may be done to check for other diseases or conditions. Your health care provider may also order a test to check the movement of blood through your arteries and veins (vascular ultrasound). How is this treated? Treatment often involves making lifestyle changes and taking steps to control your exposure to cold temperatures. For more severe cases, medicine (calcium channel blockers) may be used to improve blood flow. Surgery is sometimes done to block the nerves that control the affected arteries, but this is rare. Follow these instructions  at home:  Avoid exposure to cold by taking these steps: ? If possible, stay indoors during cold weather. ? When you go outside during cold weather, dress in layers and wear mittens, a hat, a scarf, and warm footwear. ? Wear mittens or gloves when handling ice or frozen food. ? Use holders for glasses or cans containing cold drinks. ? Let warm water run for a while before taking a shower or bath. ? Warm up the car before driving in cold weather.  If possible, avoid stressful and emotional situations. Exercise, meditation, and yoga may help you cope with stress. Biofeedback may be useful.  Do not use any tobacco products, including cigarettes, chewing tobacco, or electronic cigarettes. If you need help quitting, ask your health care provider.  Avoid secondhand smoke.  Limit your use of caffeine. Switch to decaffeinated coffee, tea, and soda. Avoid chocolate.  Wear loose fitting socks and comfortable, roomy shoes.  Avoid vibrating tools and machinery.  Take medicines only as directed by your health care provider. Contact a health care provider if:  Your discomfort becomes worse despite lifestyle changes.  You develop sores on your fingers or toes that do not heal.  Your fingers or toes turn black.  You have breaks in the skin on your fingers or toes.  You have a fever.  You have pain or swelling in your joints.  You have a rash.  Your symptoms occur on only one side of your body. This information is not intended to replace advice given to you by your health care provider. Make sure you discuss any questions you have with your health care provider. Document Released: 11/08/2000 Document Revised: 04/18/2016 Document Reviewed: 05/15/2016 Elsevier Interactive Patient Education  2017 Elsevier Inc.    Hepatitis B Vaccine, Recombinant injection What is this medicine? HEPATITIS B VACCINE (hep uh TAHY tis B VAK seen) is a vaccine. It is used to prevent an infection with the  hepatitis B virus. This medicine may be used for other purposes; ask your health care provider or pharmacist if you have questions. COMMON BRAND NAME(S): Engerix-B, Recombivax HB What should I tell my health care provider before I take this medicine? They need to know if you have any of these conditions: -fever, infection -heart disease -hepatitis B infection -immune system problems -kidney disease -an unusual or allergic reaction to vaccines, yeast, other medicines, foods, dyes, or preservatives -pregnant or trying to get pregnant -breast-feeding How should I use this medicine? This vaccine is for injection into a muscle. It is given by a health care professional. A copy of Vaccine Information Statements will be given before each vaccination. Read this sheet carefully each time. The sheet may change frequently. Talk to your pediatrician regarding the use of this medicine in children. While this drug may be prescribed for children as young as newborn for selected conditions, precautions do apply. Overdosage: If you think you have taken too much of this medicine contact a poison control center or emergency room at once. NOTE: This medicine is only for you. Do not share this medicine with others. What if I miss a dose? It is important not to miss your dose. Call your doctor or health care professional if you are unable to keep an appointment. What may interact with this medicine? -medicines that suppress your immune function like adalimumab, anakinra, infliximab -medicines to treat cancer -steroid medicines like prednisone or cortisone This list may not describe all possible interactions. Give your health care provider a list of all the medicines, herbs, non-prescription drugs, or dietary supplements you use. Also tell them if you smoke, drink alcohol, or use illegal drugs. Some items may interact with your medicine. What should I watch for while using this medicine? See your health care  provider for all shots of this vaccine as directed. You must have 3 shots of this vaccine for protection from hepatitis B infection. Tell your doctor right away if you have any serious or unusual side effects after getting this vaccine. What side effects may I notice from receiving this medicine? Side effects that you should report to your doctor or health care professional as soon as possible: -allergic reactions like skin rash, itching or hives, swelling of the face, lips, or tongue -breathing problems -confused, irritated -fast, irregular heartbeat -flu-like syndrome -numb, tingling pain -seizures -unusually weak or tired Side effects that usually do not require medical attention (report to your doctor or health care professional if they continue or are bothersome): -diarrhea -fever -headache -loss of appetite -muscle pain -nausea -pain, redness, swelling, or irritation at site where injected -tiredness This list may not describe  all possible side effects. Call your doctor for medical advice about side effects. You may report side effects to FDA at 1-800-FDA-1088. Where should I keep my medicine? This drug is given in a hospital or clinic and will not be stored at home. NOTE: This sheet is a summary. It may not cover all possible information. If you have questions about this medicine, talk to your doctor, pharmacist, or health care provider.  2018 Elsevier/Gold Standard (2014-03-14 13:26:01)
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
## 2018-10-21 NOTE — Progress Notes (Signed)
Patient does not want to take

## 2018-10-28 ENCOUNTER — Other Ambulatory Visit (INDEPENDENT_AMBULATORY_CARE_PROVIDER_SITE_OTHER): Payer: Medicare Other

## 2018-10-28 DIAGNOSIS — D649 Anemia, unspecified: Secondary | ICD-10-CM | POA: Diagnosis not present

## 2018-10-28 DIAGNOSIS — G894 Chronic pain syndrome: Secondary | ICD-10-CM

## 2018-10-28 DIAGNOSIS — Z1329 Encounter for screening for other suspected endocrine disorder: Secondary | ICD-10-CM

## 2018-10-28 DIAGNOSIS — M2550 Pain in unspecified joint: Secondary | ICD-10-CM | POA: Diagnosis not present

## 2018-10-28 DIAGNOSIS — I7300 Raynaud's syndrome without gangrene: Secondary | ICD-10-CM | POA: Diagnosis not present

## 2018-10-28 DIAGNOSIS — R5383 Other fatigue: Secondary | ICD-10-CM

## 2018-10-28 DIAGNOSIS — G629 Polyneuropathy, unspecified: Secondary | ICD-10-CM | POA: Diagnosis not present

## 2018-10-28 DIAGNOSIS — E559 Vitamin D deficiency, unspecified: Secondary | ICD-10-CM | POA: Diagnosis not present

## 2018-10-28 DIAGNOSIS — E785 Hyperlipidemia, unspecified: Secondary | ICD-10-CM | POA: Diagnosis not present

## 2018-10-28 DIAGNOSIS — E538 Deficiency of other specified B group vitamins: Secondary | ICD-10-CM

## 2018-10-28 DIAGNOSIS — D509 Iron deficiency anemia, unspecified: Secondary | ICD-10-CM

## 2018-10-28 LAB — HEPATIC FUNCTION PANEL
ALT: 20 U/L (ref 0–35)
AST: 13 U/L (ref 0–37)
Albumin: 4.2 g/dL (ref 3.5–5.2)
Alkaline Phosphatase: 99 U/L (ref 39–117)
Bilirubin, Direct: 0.1 mg/dL (ref 0.0–0.3)
TOTAL PROTEIN: 6.8 g/dL (ref 6.0–8.3)
Total Bilirubin: 0.4 mg/dL (ref 0.2–1.2)

## 2018-10-28 LAB — CBC WITH DIFFERENTIAL/PLATELET
Basophils Absolute: 0 10*3/uL (ref 0.0–0.1)
Basophils Relative: 1.1 % (ref 0.0–3.0)
Eosinophils Absolute: 0.2 10*3/uL (ref 0.0–0.7)
Eosinophils Relative: 4 % (ref 0.0–5.0)
HEMATOCRIT: 44.3 % (ref 36.0–46.0)
Hemoglobin: 14.9 g/dL (ref 12.0–15.0)
LYMPHS PCT: 26.9 % (ref 12.0–46.0)
Lymphs Abs: 1.2 10*3/uL (ref 0.7–4.0)
MCHC: 33.5 g/dL (ref 30.0–36.0)
MCV: 89.2 fl (ref 78.0–100.0)
Monocytes Absolute: 0.4 10*3/uL (ref 0.1–1.0)
Monocytes Relative: 8.9 % (ref 3.0–12.0)
Neutro Abs: 2.7 10*3/uL (ref 1.4–7.7)
Neutrophils Relative %: 59.1 % (ref 43.0–77.0)
Platelets: 209 10*3/uL (ref 150.0–400.0)
RBC: 4.97 Mil/uL (ref 3.87–5.11)
RDW: 13.9 % (ref 11.5–15.5)
WBC: 4.5 10*3/uL (ref 4.0–10.5)

## 2018-10-28 LAB — B12 AND FOLATE PANEL
Folate: 9.7 ng/mL (ref >5.9)
Vitamin B-12: 258 pg/mL (ref 211–911)

## 2018-10-28 LAB — T4, FREE: Free T4: 2.24 ng/dL — ABNORMAL HIGH (ref 0.60–1.60)

## 2018-10-28 LAB — LIPID PANEL
Cholesterol: 215 mg/dL — ABNORMAL HIGH (ref 0–200)
HDL: 57.3 mg/dL (ref >39.00)
LDL CALC: 139 mg/dL — AB (ref 0–99)
NonHDL: 157.86
Total CHOL/HDL Ratio: 4
Triglycerides: 92 mg/dL (ref 0.0–149.0)
VLDL: 18.4 mg/dL (ref 0.0–40.0)

## 2018-10-28 LAB — TSH: TSH: 1.11 u[IU]/mL (ref 0.35–4.50)

## 2018-10-28 LAB — C-REACTIVE PROTEIN: CRP: 0.4 mg/dL — ABNORMAL LOW (ref 0.5–20.0)

## 2018-10-28 LAB — VITAMIN D 25 HYDROXY (VIT D DEFICIENCY, FRACTURES): VITD: 58.12 ng/mL (ref 30.00–100.00)

## 2018-10-28 LAB — SEDIMENTATION RATE: Sed Rate: 13 mm/hr (ref 0–30)

## 2018-10-28 NOTE — Addendum Note (Signed)
Addended by: Arby Barrette on: 10/28/2018 09:32 AM   Modules accepted: Orders

## 2018-10-29 ENCOUNTER — Ambulatory Visit: Payer: BLUE CROSS/BLUE SHIELD | Admitting: Licensed Clinical Social Worker

## 2018-10-29 LAB — CYCLIC CITRUL PEPTIDE ANTIBODY, IGG: Cyclic Citrullin Peptide Ab: <16 UNITS

## 2018-10-29 LAB — RHEUMATOID FACTOR: Rheumatoid fact SerPl-aCnc: <14 IU/mL (ref <14)

## 2018-11-04 ENCOUNTER — Ambulatory Visit: Payer: BLUE CROSS/BLUE SHIELD | Admitting: Psychiatry

## 2018-11-23 ENCOUNTER — Other Ambulatory Visit: Payer: Self-pay | Admitting: Psychiatry

## 2018-11-23 ENCOUNTER — Ambulatory Visit: Payer: BLUE CROSS/BLUE SHIELD | Admitting: Licensed Clinical Social Worker

## 2018-11-23 DIAGNOSIS — F4010 Social phobia, unspecified: Secondary | ICD-10-CM

## 2018-11-23 DIAGNOSIS — F410 Panic disorder [episodic paroxysmal anxiety] without agoraphobia: Secondary | ICD-10-CM

## 2018-11-24 ENCOUNTER — Ambulatory Visit (INDEPENDENT_AMBULATORY_CARE_PROVIDER_SITE_OTHER): Payer: Medicare Other | Admitting: Internal Medicine

## 2018-11-24 DIAGNOSIS — H66002 Acute suppurative otitis media without spontaneous rupture of ear drum, left ear: Secondary | ICD-10-CM | POA: Diagnosis not present

## 2018-11-24 MED ORDER — AMOXICILLIN-POT CLAVULANATE 875-125 MG PO TABS: 1.0000 | ORAL_TABLET | Freq: Two times a day (BID) | ORAL | 0 refills | Status: DC

## 2018-11-24 MED ORDER — BENZONATATE 200 MG PO CAPS: 200.0000 mg | ORAL_CAPSULE | Freq: Two times a day (BID) | ORAL | 0 refills | Status: DC | PRN

## 2018-11-24 MED ORDER — PREDNISONE 10 MG PO TABS: ORAL_TABLET | ORAL | 0 refills | Status: DC

## 2018-11-24 NOTE — Patient Instructions (Signed)
I am treating you for sinusitis/otitis media   which is a complication from a  viral infection due to  persistent sinus congestion.   I am prescribing an antibiotic (augmentin: take twice daily with food ) and a prednisone taper  To manage the infection and the inflammation in your sinuses.   The prednisone taper should be taken as follows :    6 tablets all at once  IN THE MORNING  on Day 1,  Then 5 tablets on day 2  CONTINUE TO TAPER  by 1 tablet daily until gone  Use the tessalon capsules up to 3 times daily for the cough.  If they are too $$$ ,  Use OTC Delsym or Robitussin DM.    For nighttime cough,   Add benadryl 25 mg 30 minutes before bedtime to the cough medicine    you may try using  Afrin nasal spray twice daily for a maximum of 5 days  To manage the congestion.  Gargle with salt water as needed for sore throat.   Please take a probiotic ( Align, Flora[place] or Culturelle), the generic version of one of these over the counter medications, or an alternative form (kombucha,  Yogurt, or another dietary source) for a minimum of 3 weeks to prevent a serious antibiotic associated diarrhea  Called clostridium dificile colitis.

## 2018-11-24 NOTE — Progress Notes (Signed)
Subjective:  Patient ID: Erika Hanson, female    DOB: 30-Dec-1965  Age: 52 y.o. MRN: [id number]  CC: The encounter diagnosis was Non-recurrent acute suppurative otitis media of left ear without spontaneous rupture of tympanic membrane.  HPI Erika Hanson presents for evaluation and treatment of cough and sinus pressure affecting both sides and ears.  Has been having cough productive of brown sputum and brown nasal drainage for the past 7 days  Started with blood streaked mucoid nasal discharge  Which is now red/brown.  And now a dry cough,    Taking coridicin   Taking tramadol for chronic pain , now prescribed by [employer] (contract signed)  History  of hypertension on meds  No alcohol or tobacco use     Outpatient Medications Prior to Visit  Medication Sig Dispense Refill  . albuterol (PROVENTIL HFA;VENTOLIN HFA) 108 (90 Base) MCG/ACT inhaler Inhale into the lungs.    . Biotin 10000 MCG TABS Take 2 tablets by mouth.    Marland Kitchen buPROPion (WELLBUTRIN XL) 150 MG 24 hr tablet Take 1 tablet (150 mg total) by mouth daily. 90 tablet 1  . busPIRone (BUSPAR) 15 MG tablet Take 1 tablet (15 mg total) by mouth 3 (three) times daily. 270 tablet 1  . celecoxib (CELEBREX) 100 MG capsule TAKE 1 CAPSULE BY MOUTH TWICE DAILY 60 capsule 3  . Cholecalciferol 50000 units capsule Take 1 capsule (50,000 Units total) by mouth once a week. 13 capsule 1  . clonazePAM (KLONOPIN) 0.5 MG tablet Take 0.5-1 tablets (0.25-0.5 mg total) by mouth as directed. Take half to one tablet as needed only for severe anxiety attacks. 20 tablet 0  . DULoxetine (CYMBALTA) 30 MG capsule Total of  90mg  daily 30 capsule 11  . DULoxetine (CYMBALTA) 60 MG capsule Take 90 mg daily (60 mg + 30 mg) 30 capsule 11  . ferrous sulfate 325 (65 FE) MG tablet Take 1 tablet (325 mg total) 2 (two) times daily with a meal by mouth.    . hydrochlorothiazide (HYDRODIURIL) 25 MG tablet TAKE 1 TABLET BY MOUTH ONCE DAILY 30 tablet 6  . lamoTRIgine  (LAMICTAL) 100 MG tablet Take 1.5 tablets (150 mg total) by mouth daily. 135 tablet 1  . linaclotide (LINZESS) 290 MCG CAPS capsule Take 1 capsule (290 mcg total) by mouth daily before breakfast. 90 capsule 3  . losartan (COZAAR) 50 MG tablet TAKE 1 TABLET BY MOUTH ONCE A DAY FOR HIGH BLOOD PRESSURE. 60 tablet 6  . mupirocin ointment (BACTROBAN) 2 % Apply 1 application topically 3 (three) times daily. Left foot 30 g 0  . omeprazole (PRILOSEC) 20 MG capsule Take 1 capsule (20 mg total) by mouth at bedtime. 90 capsule 1  . propranolol (INDERAL) 10 MG tablet TAKE 1 TABLET BY MOUTH 3 TIMES DAILY AS NEEDED FOR SEVERE ANXIETY ATTACKS 90 tablet 1  . risperiDONE (RISPERDAL) 0.5 MG tablet TAKE AS DIRECTED: TAKE 1 TABLET (0.5 MG TOTAL) BY MOUTH TWICE DAILY AND 1/2 TABLET (0.25 MG TOTAL) BY MOUTH DAILY FOR AGITATION. 225 tablet 0  . tiZANidine (ZANAFLEX) 4 MG tablet Take 1 tablet (4 mg total) by mouth every 8 (eight) hours as needed for muscle spasms. 90 tablet 11  . topiramate (TOPAMAX) 200 MG tablet TAKE 1 TABLET BY MOUTH TWICE A DAY 180 tablet 2  . traMADol (ULTRAM) 50 MG tablet Take 2 tablets (100 mg total) by mouth daily as needed (Dr. Lynann Bologna). 60 tablet 2  . traZODone (DESYREL) 150 MG tablet  Take 1 tablet (150 mg total) by mouth at bedtime. sleep 90 tablet 1  . Vitamin D, Ergocalciferol, (DRISDOL) 50000 units CAPS capsule      No facility-administered medications prior to visit.     Review of Systems;  Patient denies headache, fevers, malaise, unintentional weight loss, skin rash, eye pain, sinus congestion and sinus pain, sore throat, dysphagia,  hemoptysis , cough, dyspnea, wheezing, chest pain, palpitations, orthopnea, edema, abdominal pain, nausea, melena, diarrhea, constipation, flank pain, dysuria, hematuria, urinary  Frequency, nocturia, numbness, tingling, seizures,  Focal weakness, Loss of consciousness,  Tremor, insomnia, depression, anxiety, and suicidal ideation.      Objective:  BP  120/78 (BP Location: Left Arm, Patient Position: Sitting, Cuff Size: Large)   Pulse 70   Temp 98.6 F (37 C) (Oral)   Resp 16   Ht 5\' 5"  (1.651 m)   Wt 289 lb 3.2 oz (131.2 kg)   LMP 05/21/2016   SpO2 97%   BMI 48.13 kg/m   BP Readings from Last 3 Encounters:  11/24/18 120/78  10/20/18 126/84  08/26/18 126/82    Wt Readings from Last 3 Encounters:  11/24/18 289 lb 3.2 oz (131.2 kg)  10/20/18 290 lb 9.6 oz (131.8 kg)  08/26/18 (!) 302 lb (137 kg)    General appearance: alert, cooperative and appears stated age Ears: injected erythematous left TM,  Right TM normal s Throat: lips, mucosa, and tongue normal; teeth and gums normal. tosillary erythema without ulceration of placques Neck: no adenopathy, no carotid bruit, supple, symmetrical, trachea midline and thyroid not enlarged, symmetric, no tenderness/mass/nodules Back: symmetric, no curvature. ROM normal. No CVA tenderness. Lungs: clear to auscultation bilaterally Heart: regular rate and rhythm, S1, S2 normal, no murmur, click, rub or gallop Abdomen: soft, non-tender; bowel sounds normal; no masses,  no organomegaly Pulses: 2+ and symmetric Skin: Skin color, texture, turgor normal. No rashes or lesions Lymph nodes: Cervical, supraclavicular, and axillary nodes normal.  Lab Results  Component Value Date   HGBA1C 5.3 12/30/2017   HGBA1C 5.8 (H) 05/21/2017   HGBA1C 6.3 (H) 02/17/2017    Lab Results  Component Value Date   CREATININE 1.23 (H) 04/22/2018   CREATININE 1.17 (H) 12/30/2017   CREATININE 1.20 (H) 05/21/2017    Lab Results  Component Value Date   WBC 4.5 10/28/2018   HGB 14.9 10/28/2018   HCT 44.3 10/28/2018   PLT 209.0 10/28/2018   GLUCOSE 109 (H) 04/22/2018   CHOL 215 (H) 10/28/2018   TRIG 92.0 10/28/2018   HDL 57.30 10/28/2018   LDLCALC 139 (H) 10/28/2018   ALT 20 10/28/2018   AST 13 10/28/2018   NA 140 04/22/2018   K 3.5 04/22/2018   CL 105 04/22/2018   CREATININE 1.23 (H) 04/22/2018   BUN  26 (H) 04/22/2018   CO2 26 04/22/2018   TSH 1.11 10/28/2018   INR 1.06 04/22/2018   HGBA1C 5.3 12/30/2017    US Renal  Result Date: 10/02/2018 CLINICAL DATA:  Chronic kidney disease stage 3 EXAM: RENAL / URINARY TRACT ULTRASOUND COMPLETE COMPARISON:  None. FINDINGS: Right Kidney: Renal measurements: 9.9 x 4.2 x 4.6 cm = volume: 100 mL . Echogenicity within normal limits. No mass or hydronephrosis visualized. Left Kidney: Renal measurements: 11.7 x 4.6 x 5.4 cm = volume: 153 mL. Branching fluid-filled cystic structure in the region of the renal pelvis. This involves the mid and lower pole and could represent lower pole hydronephrosis or parapelvic cyst. No renal mass. Bladder: Appears normal for  degree of bladder distention. IMPRESSION: Left kidney larger than right.  Renal measurements above Fluid-filled branching structure left lower pole. Review of the prior MRI of 03/27/2018 confirms this finding is more suggestive of parapelvic cysts rather than lower pole hydronephrosis. Electronically Signed   By: Franchot Gallo M.D.   On: 10/02/2018 08:56    Assessment & Plan:   Problem List Items Addressed This Visit    Otitis media    Given chronicity of symptoms, development of left ear pain and  exam consistent with bacterial otitis ,  Will treat with empiric antibiotics, decongestants, and steroid taper . probiotic advised        Relevant Medications   amoxicillin-clavulanate (AUGMENTIN) 875-125 MG tablet      I am having Stokes C. Malecki "KIM" start on amoxicillin-clavulanate, predniSONE, and benzonatate. I am also having her maintain her ferrous sulfate, losartan, Cholecalciferol, DULoxetine, DULoxetine, Biotin, tiZANidine, mupirocin ointment, omeprazole, linaclotide, buPROPion, busPIRone, hydrochlorothiazide, albuterol, Vitamin D (Ergocalciferol), lamoTRIgine, traZODone, topiramate, celecoxib, clonazePAM, traMADol, risperiDONE, and propranolol.  Meds ordered this encounter  Medications  .  amoxicillin-clavulanate (AUGMENTIN) 875-125 MG tablet    Sig: Take 1 tablet by mouth 2 (two) times daily.    Dispense:  14 tablet    Refill:  0  . predniSONE (DELTASONE) 10 MG tablet    Sig: 6 tablets in the morning  on Day 1 , then reduce by 1 tablet daily until gone    Dispense:  21 tablet    Refill:  0  . benzonatate (TESSALON) 200 MG capsule    Sig: Take 1 capsule (200 mg total) by mouth 2 (two) times daily as needed for cough.    Dispense:  20 capsule    Refill:  0    There are no discontinued medications.  Follow-up: No follow-ups on file.   Crecencio Mc, MD

## 2018-11-25 DIAGNOSIS — H6690 Otitis media, unspecified, unspecified ear: Secondary | ICD-10-CM | POA: Insufficient documentation

## 2018-11-25 HISTORY — DX: Otitis media, unspecified, unspecified ear: H66.90

## 2018-11-25 NOTE — Assessment & Plan Note (Signed)
Given chronicity of symptoms, development of left ear pain and  exam consistent with bacterial otitis ,  Will treat with empiric antibiotics, decongestants, and steroid taper . probiotic advised    

## 2018-12-16 ENCOUNTER — Other Ambulatory Visit: Payer: Self-pay | Admitting: Psychiatry

## 2018-12-16 ENCOUNTER — Telehealth: Payer: Self-pay | Admitting: Psychiatry

## 2018-12-16 ENCOUNTER — Other Ambulatory Visit: Payer: Self-pay | Admitting: Family Medicine

## 2018-12-16 DIAGNOSIS — F4010 Social phobia, unspecified: Secondary | ICD-10-CM

## 2018-12-16 DIAGNOSIS — F410 Panic disorder [episodic paroxysmal anxiety] without agoraphobia: Secondary | ICD-10-CM

## 2018-12-16 NOTE — Telephone Encounter (Signed)
Patient needs appointment prior to further refills

## 2018-12-16 NOTE — Telephone Encounter (Signed)
received fax that risperidone .5mg  was approved from  12-08-18-12-06-21

## 2018-12-17 ENCOUNTER — Telehealth: Payer: Self-pay

## 2018-12-17 NOTE — Telephone Encounter (Signed)
received fax stating a pa was required for risperidone

## 2018-12-17 NOTE — Telephone Encounter (Signed)
form needs to be signed and completed .    just a fyi it possible needs prior auth because it is more then one tablet a day. you may need to call in a 1mg  and a .25mg  instead but i will send in paper work upon completing and see what happens.   Form is in your box

## 2018-12-17 NOTE — Telephone Encounter (Signed)
ok 

## 2018-12-28 ENCOUNTER — Ambulatory Visit (INDEPENDENT_AMBULATORY_CARE_PROVIDER_SITE_OTHER): Payer: Medicaid Other | Admitting: Psychiatry

## 2018-12-28 ENCOUNTER — Other Ambulatory Visit: Payer: Self-pay

## 2018-12-28 ENCOUNTER — Encounter: Payer: Self-pay | Admitting: Psychiatry

## 2018-12-28 VITALS — BP 125/79 | HR 72 | Temp 97.5°F | Wt 289.4 lb

## 2018-12-28 DIAGNOSIS — F4310 Post-traumatic stress disorder, unspecified: Secondary | ICD-10-CM | POA: Diagnosis not present

## 2018-12-28 DIAGNOSIS — F331 Major depressive disorder, recurrent, moderate: Secondary | ICD-10-CM

## 2018-12-28 DIAGNOSIS — F411 Generalized anxiety disorder: Secondary | ICD-10-CM

## 2018-12-28 DIAGNOSIS — F4010 Social phobia, unspecified: Secondary | ICD-10-CM

## 2018-12-28 DIAGNOSIS — F410 Panic disorder [episodic paroxysmal anxiety] without agoraphobia: Secondary | ICD-10-CM

## 2018-12-28 MED ORDER — CLONAZEPAM 0.5 MG PO TABS: 0.2500 mg | ORAL_TABLET | ORAL | 0 refills | Status: DC

## 2018-12-28 MED ORDER — BUPROPION HCL ER (SR) 100 MG PO TB12: 100.0000 mg | ORAL_TABLET | Freq: Two times a day (BID) | ORAL | 0 refills | Status: DC
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
## 2018-12-30 ENCOUNTER — Ambulatory Visit: Payer: BLUE CROSS/BLUE SHIELD | Admitting: Licensed Clinical Social Worker

## 2019-01-01 ENCOUNTER — Encounter: Payer: Self-pay | Admitting: Family

## 2019-01-01 ENCOUNTER — Ambulatory Visit (INDEPENDENT_AMBULATORY_CARE_PROVIDER_SITE_OTHER): Payer: Medicare Other | Admitting: Family

## 2019-01-01 VITALS — BP 110/74 | HR 83 | Temp 97.7°F | Ht 65.0 in | Wt 286.8 lb

## 2019-01-01 DIAGNOSIS — J40 Bronchitis, not specified as acute or chronic: Secondary | ICD-10-CM | POA: Diagnosis not present

## 2019-01-01 MED ORDER — ALBUTEROL SULFATE HFA 108 (90 BASE) MCG/ACT IN AERS: 2.0000 | INHALATION_SPRAY | Freq: Four times a day (QID) | RESPIRATORY_TRACT | 0 refills | Status: DC | PRN

## 2019-01-01 MED ORDER — AZITHROMYCIN 250 MG PO TABS: ORAL_TABLET | ORAL | 0 refills | Status: DC

## 2019-01-01 MED ORDER — BENZONATATE 100 MG PO CAPS: 100.0000 mg | ORAL_CAPSULE | Freq: Two times a day (BID) | ORAL | 0 refills | Status: DC | PRN

## 2019-01-01 NOTE — Patient Instructions (Signed)
Use albuterol every 6 hours for first 24 hours to get good medication into the lungs and loosen congestion; after, you may use as needed and eventually stop all together when cough resolves.  Start zpak  Ensure to take probiotics while on antibiotics and also for 2 weeks after completion. It is important to re-colonize the gut with good bacteria and also to prevent any diarrheal infections associated with antibiotic use.   Tessalon perles as needed  Let me know if not better
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
## 2019-01-13 ENCOUNTER — Other Ambulatory Visit: Payer: Self-pay | Admitting: Psychiatry

## 2019-01-13 DIAGNOSIS — F4010 Social phobia, unspecified: Secondary | ICD-10-CM

## 2019-01-13 DIAGNOSIS — F410 Panic disorder [episodic paroxysmal anxiety] without agoraphobia: Secondary | ICD-10-CM

## 2019-01-20 ENCOUNTER — Ambulatory Visit: Payer: Medicaid Other | Admitting: Licensed Clinical Social Worker

## 2019-01-25 ENCOUNTER — Ambulatory Visit: Payer: BLUE CROSS/BLUE SHIELD | Admitting: Psychiatry

## 2019-01-27 ENCOUNTER — Other Ambulatory Visit: Payer: Self-pay | Admitting: Family Medicine

## 2019-01-27 ENCOUNTER — Other Ambulatory Visit: Payer: Self-pay | Admitting: Psychiatry

## 2019-01-27 DIAGNOSIS — F411 Generalized anxiety disorder: Secondary | ICD-10-CM

## 2019-01-27 DIAGNOSIS — F331 Major depressive disorder, recurrent, moderate: Secondary | ICD-10-CM

## 2019-01-27 DIAGNOSIS — F4310 Post-traumatic stress disorder, unspecified: Secondary | ICD-10-CM

## 2019-01-27 DIAGNOSIS — F4010 Social phobia, unspecified: Secondary | ICD-10-CM

## 2019-01-27 DIAGNOSIS — F410 Panic disorder [episodic paroxysmal anxiety] without agoraphobia: Secondary | ICD-10-CM

## 2019-02-02 ENCOUNTER — Other Ambulatory Visit: Payer: Self-pay

## 2019-02-02 ENCOUNTER — Other Ambulatory Visit: Payer: Self-pay | Admitting: Psychiatry

## 2019-02-02 DIAGNOSIS — F4310 Post-traumatic stress disorder, unspecified: Secondary | ICD-10-CM

## 2019-02-02 DIAGNOSIS — F331 Major depressive disorder, recurrent, moderate: Secondary | ICD-10-CM

## 2019-02-02 DIAGNOSIS — F411 Generalized anxiety disorder: Secondary | ICD-10-CM

## 2019-02-02 MED ORDER — OMEPRAZOLE 20 MG PO CPDR: 20.0000 mg | DELAYED_RELEASE_CAPSULE | Freq: Every day | ORAL | 1 refills | Status: DC

## 2019-02-03 ENCOUNTER — Encounter: Payer: Self-pay | Admitting: Psychiatry

## 2019-02-03 ENCOUNTER — Other Ambulatory Visit: Payer: Self-pay

## 2019-02-03 ENCOUNTER — Ambulatory Visit (INDEPENDENT_AMBULATORY_CARE_PROVIDER_SITE_OTHER): Payer: Medicare Other | Admitting: Psychiatry

## 2019-02-03 VITALS — BP 123/75 | HR 71 | Temp 97.8°F | Wt 289.0 lb

## 2019-02-03 DIAGNOSIS — F4010 Social phobia, unspecified: Secondary | ICD-10-CM | POA: Diagnosis not present

## 2019-02-03 DIAGNOSIS — F331 Major depressive disorder, recurrent, moderate: Secondary | ICD-10-CM

## 2019-02-03 DIAGNOSIS — F4310 Post-traumatic stress disorder, unspecified: Secondary | ICD-10-CM | POA: Diagnosis not present

## 2019-02-03 DIAGNOSIS — F410 Panic disorder [episodic paroxysmal anxiety] without agoraphobia: Secondary | ICD-10-CM

## 2019-02-03 DIAGNOSIS — F411 Generalized anxiety disorder: Secondary | ICD-10-CM | POA: Diagnosis not present

## 2019-02-03 MED ORDER — RISPERIDONE 0.5 MG PO TABS: 0.5000 mg | ORAL_TABLET | Freq: Two times a day (BID) | ORAL | 1 refills | Status: DC

## 2019-02-03 MED ORDER — LAMOTRIGINE 100 MG PO TABS: 150.0000 mg | ORAL_TABLET | Freq: Every day | ORAL | 1 refills | Status: DC
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
## 2019-02-10 ENCOUNTER — Other Ambulatory Visit: Payer: Self-pay | Admitting: Family Medicine

## 2019-02-10 ENCOUNTER — Other Ambulatory Visit: Payer: Self-pay | Admitting: Psychiatry

## 2019-02-10 DIAGNOSIS — F411 Generalized anxiety disorder: Secondary | ICD-10-CM

## 2019-02-10 DIAGNOSIS — F331 Major depressive disorder, recurrent, moderate: Secondary | ICD-10-CM

## 2019-02-17 ENCOUNTER — Other Ambulatory Visit: Payer: Self-pay

## 2019-02-17 ENCOUNTER — Ambulatory Visit (INDEPENDENT_AMBULATORY_CARE_PROVIDER_SITE_OTHER): Payer: BLUE CROSS/BLUE SHIELD | Admitting: Licensed Clinical Social Worker

## 2019-02-17 DIAGNOSIS — F4310 Post-traumatic stress disorder, unspecified: Secondary | ICD-10-CM

## 2019-02-18 ENCOUNTER — Ambulatory Visit: Payer: Self-pay | Admitting: Internal Medicine
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
## 2019-03-01 ENCOUNTER — Other Ambulatory Visit: Payer: Self-pay | Admitting: Psychiatry

## 2019-03-01 DIAGNOSIS — F410 Panic disorder [episodic paroxysmal anxiety] without agoraphobia: Secondary | ICD-10-CM

## 2019-03-01 DIAGNOSIS — F4010 Social phobia, unspecified: Secondary | ICD-10-CM

## 2019-03-01 DIAGNOSIS — F4310 Post-traumatic stress disorder, unspecified: Secondary | ICD-10-CM

## 2019-03-01 DIAGNOSIS — F331 Major depressive disorder, recurrent, moderate: Secondary | ICD-10-CM

## 2019-03-01 DIAGNOSIS — F411 Generalized anxiety disorder: Secondary | ICD-10-CM

## 2019-03-02 ENCOUNTER — Other Ambulatory Visit: Payer: Self-pay | Admitting: Internal Medicine

## 2019-03-02 DIAGNOSIS — G8929 Other chronic pain: Secondary | ICD-10-CM

## 2019-03-02 MED ORDER — TRAMADOL HCL 50 MG PO TABS: 100.0000 mg | ORAL_TABLET | Freq: Every day | ORAL | 2 refills | Status: DC | PRN

## 2019-03-12 ENCOUNTER — Ambulatory Visit (INDEPENDENT_AMBULATORY_CARE_PROVIDER_SITE_OTHER): Payer: Medicare Other | Admitting: Internal Medicine

## 2019-03-12 DIAGNOSIS — L237 Allergic contact dermatitis due to plants, except food: Secondary | ICD-10-CM

## 2019-03-12 DIAGNOSIS — Z1231 Encounter for screening mammogram for malignant neoplasm of breast: Secondary | ICD-10-CM

## 2019-03-12 DIAGNOSIS — F411 Generalized anxiety disorder: Secondary | ICD-10-CM | POA: Diagnosis not present

## 2019-03-12 MED ORDER — PREDNISONE 20 MG PO TABS: 40.0000 mg | ORAL_TABLET | Freq: Every day | ORAL | 0 refills | Status: DC

## 2019-03-12 MED ORDER — TRIAMCINOLONE ACETONIDE 0.1 % EX CREA: 1.0000 "application " | TOPICAL_CREAM | Freq: Two times a day (BID) | CUTANEOUS | 0 refills | Status: DC
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
## 2019-03-16 ENCOUNTER — Encounter: Payer: Self-pay | Admitting: Internal Medicine

## 2019-03-16 NOTE — Progress Notes (Signed)
You want me to resch appt? It's already a Doxy appt. Let me know? Thank you!

## 2019-03-27 ENCOUNTER — Other Ambulatory Visit: Payer: Self-pay | Admitting: Psychiatry

## 2019-03-27 DIAGNOSIS — F410 Panic disorder [episodic paroxysmal anxiety] without agoraphobia: Secondary | ICD-10-CM

## 2019-03-27 DIAGNOSIS — F4010 Social phobia, unspecified: Secondary | ICD-10-CM

## 2019-03-29 ENCOUNTER — Telehealth: Payer: Self-pay | Admitting: Internal Medicine

## 2019-03-29 NOTE — Telephone Encounter (Signed)
Poison ivy is a lot better, she did not even take the full amount of prednisone.

## 2019-03-29 NOTE — Telephone Encounter (Signed)
Call pt how is poison ivy ? -should be better would not rec further refills of prednisone if better as we are trying to avoid steroids during COVID if possible would use topical steroid creams  Call [facility] and d/c further refills of prednisone   [place]

## 2019-04-14 ENCOUNTER — Encounter: Payer: Self-pay | Admitting: Internal Medicine

## 2019-04-14 ENCOUNTER — Other Ambulatory Visit: Payer: Self-pay

## 2019-04-14 ENCOUNTER — Ambulatory Visit (INDEPENDENT_AMBULATORY_CARE_PROVIDER_SITE_OTHER): Payer: Medicare Other | Admitting: Internal Medicine

## 2019-04-14 DIAGNOSIS — R946 Abnormal results of thyroid function studies: Secondary | ICD-10-CM | POA: Diagnosis not present

## 2019-04-14 DIAGNOSIS — M542 Cervicalgia: Secondary | ICD-10-CM | POA: Diagnosis not present

## 2019-04-14 DIAGNOSIS — R269 Unspecified abnormalities of gait and mobility: Secondary | ICD-10-CM

## 2019-04-14 DIAGNOSIS — R937 Abnormal findings on diagnostic imaging of other parts of musculoskeletal system: Secondary | ICD-10-CM | POA: Diagnosis not present

## 2019-04-14 DIAGNOSIS — G8929 Other chronic pain: Secondary | ICD-10-CM

## 2019-04-14 DIAGNOSIS — Z1389 Encounter for screening for other disorder: Secondary | ICD-10-CM

## 2019-04-14 DIAGNOSIS — I10 Essential (primary) hypertension: Secondary | ICD-10-CM | POA: Diagnosis not present

## 2019-04-14 DIAGNOSIS — E785 Hyperlipidemia, unspecified: Secondary | ICD-10-CM

## 2019-04-14 DIAGNOSIS — M5442 Lumbago with sciatica, left side: Secondary | ICD-10-CM

## 2019-04-14 DIAGNOSIS — W19XXXA Unspecified fall, initial encounter: Secondary | ICD-10-CM | POA: Diagnosis not present
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
## 2019-04-20 DIAGNOSIS — M542 Cervicalgia: Secondary | ICD-10-CM | POA: Diagnosis not present

## 2019-04-20 DIAGNOSIS — M5412 Radiculopathy, cervical region: Secondary | ICD-10-CM | POA: Diagnosis not present

## 2019-04-21 ENCOUNTER — Other Ambulatory Visit: Payer: Self-pay | Admitting: Psychiatry

## 2019-04-21 DIAGNOSIS — F4010 Social phobia, unspecified: Secondary | ICD-10-CM

## 2019-04-21 DIAGNOSIS — F411 Generalized anxiety disorder: Secondary | ICD-10-CM

## 2019-04-21 DIAGNOSIS — F4310 Post-traumatic stress disorder, unspecified: Secondary | ICD-10-CM

## 2019-04-21 DIAGNOSIS — F331 Major depressive disorder, recurrent, moderate: Secondary | ICD-10-CM

## 2019-04-21 DIAGNOSIS — F410 Panic disorder [episodic paroxysmal anxiety] without agoraphobia: Secondary | ICD-10-CM

## 2019-04-21 MED ORDER — BUPROPION HCL ER (SR) 100 MG PO TB12: 100.0000 mg | ORAL_TABLET | Freq: Two times a day (BID) | ORAL | 1 refills | Status: DC

## 2019-04-21 MED ORDER — CLONAZEPAM 0.5 MG PO TABS: ORAL_TABLET | ORAL | 1 refills | Status: DC

## 2019-04-21 NOTE — Telephone Encounter (Signed)
Sent klonopin, wellbutrin to pharmacy

## 2019-04-27 ENCOUNTER — Other Ambulatory Visit: Payer: Self-pay | Admitting: Internal Medicine

## 2019-04-27 ENCOUNTER — Other Ambulatory Visit: Payer: Self-pay | Admitting: Family Medicine

## 2019-04-27 ENCOUNTER — Other Ambulatory Visit: Payer: Self-pay | Admitting: Psychiatry

## 2019-04-27 DIAGNOSIS — F331 Major depressive disorder, recurrent, moderate: Secondary | ICD-10-CM

## 2019-04-27 DIAGNOSIS — F410 Panic disorder [episodic paroxysmal anxiety] without agoraphobia: Secondary | ICD-10-CM

## 2019-04-27 DIAGNOSIS — F32A Depression, unspecified: Secondary | ICD-10-CM

## 2019-04-27 DIAGNOSIS — G8929 Other chronic pain: Secondary | ICD-10-CM

## 2019-04-27 DIAGNOSIS — K219 Gastro-esophageal reflux disease without esophagitis: Secondary | ICD-10-CM

## 2019-04-27 DIAGNOSIS — F4010 Social phobia, unspecified: Secondary | ICD-10-CM

## 2019-04-27 DIAGNOSIS — F411 Generalized anxiety disorder: Secondary | ICD-10-CM

## 2019-04-27 DIAGNOSIS — F329 Major depressive disorder, single episode, unspecified: Secondary | ICD-10-CM

## 2019-04-27 MED ORDER — OMEPRAZOLE 20 MG PO CPDR: 20.0000 mg | DELAYED_RELEASE_CAPSULE | Freq: Every day | ORAL | 3 refills | Status: DC

## 2019-04-27 MED ORDER — TRAMADOL HCL 50 MG PO TABS: 100.0000 mg | ORAL_TABLET | Freq: Every day | ORAL | 2 refills | Status: DC | PRN

## 2019-04-27 MED ORDER — DULOXETINE HCL 60 MG PO CPEP: ORAL_CAPSULE | ORAL | 11 refills | Status: DC

## 2019-04-27 MED ORDER — DULOXETINE HCL 30 MG PO CPEP: ORAL_CAPSULE | ORAL | 11 refills | Status: DC

## 2019-04-28 ENCOUNTER — Other Ambulatory Visit: Payer: Self-pay | Admitting: Orthopedic Surgery

## 2019-04-28 ENCOUNTER — Other Ambulatory Visit (HOSPITAL_COMMUNITY): Payer: Self-pay | Admitting: Orthopedic Surgery

## 2019-04-28 DIAGNOSIS — M5412 Radiculopathy, cervical region: Secondary | ICD-10-CM

## 2019-04-29 ENCOUNTER — Other Ambulatory Visit: Payer: Self-pay | Admitting: Internal Medicine

## 2019-04-29 ENCOUNTER — Other Ambulatory Visit: Payer: Self-pay | Admitting: Family Medicine

## 2019-04-29 DIAGNOSIS — M1990 Unspecified osteoarthritis, unspecified site: Secondary | ICD-10-CM

## 2019-04-29 MED ORDER — CELECOXIB 100 MG PO CAPS: 100.0000 mg | ORAL_CAPSULE | Freq: Two times a day (BID) | ORAL | 3 refills | Status: DC

## 2019-05-05 ENCOUNTER — Encounter: Payer: Self-pay | Admitting: Psychiatry

## 2019-05-05 ENCOUNTER — Other Ambulatory Visit: Payer: Self-pay

## 2019-05-05 ENCOUNTER — Ambulatory Visit (INDEPENDENT_AMBULATORY_CARE_PROVIDER_SITE_OTHER): Payer: Medicare Other | Admitting: Psychiatry

## 2019-05-05 DIAGNOSIS — F410 Panic disorder [episodic paroxysmal anxiety] without agoraphobia: Secondary | ICD-10-CM

## 2019-05-05 DIAGNOSIS — F331 Major depressive disorder, recurrent, moderate: Secondary | ICD-10-CM

## 2019-05-05 DIAGNOSIS — F4010 Social phobia, unspecified: Secondary | ICD-10-CM | POA: Diagnosis not present

## 2019-05-05 DIAGNOSIS — F411 Generalized anxiety disorder: Secondary | ICD-10-CM | POA: Diagnosis not present

## 2019-05-05 DIAGNOSIS — F4310 Post-traumatic stress disorder, unspecified: Secondary | ICD-10-CM

## 2019-05-05 MED ORDER — BUSPIRONE HCL 30 MG PO TABS: 30.0000 mg | ORAL_TABLET | Freq: Two times a day (BID) | ORAL | 1 refills | Status: DC
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
## 2019-05-08 ENCOUNTER — Other Ambulatory Visit: Payer: Self-pay

## 2019-05-08 ENCOUNTER — Ambulatory Visit
Admission: RE | Admit: 2019-05-08 | Discharge: 2019-05-08 | Disposition: A | Discharge status: Home / Self Care | Payer: Medicare Other | Source: Ambulatory Visit | Attending: Orthopedic Surgery | Admitting: Orthopedic Surgery

## 2019-05-08 DIAGNOSIS — M5412 Radiculopathy, cervical region: Secondary | ICD-10-CM | POA: Insufficient documentation

## 2019-05-08 DIAGNOSIS — M50121 Cervical disc disorder at C4-C5 level with radiculopathy: Secondary | ICD-10-CM | POA: Diagnosis not present

## 2019-05-11 ENCOUNTER — Other Ambulatory Visit: Payer: Self-pay | Admitting: Orthopedic Surgery

## 2019-05-11 DIAGNOSIS — S129XXA Fracture of neck, unspecified, initial encounter: Secondary | ICD-10-CM

## 2019-05-11 DIAGNOSIS — M542 Cervicalgia: Secondary | ICD-10-CM | POA: Diagnosis not present

## 2019-05-11 DIAGNOSIS — M5416 Radiculopathy, lumbar region: Secondary | ICD-10-CM | POA: Diagnosis not present

## 2019-05-11 DIAGNOSIS — S32009K Unspecified fracture of unspecified lumbar vertebra, subsequent encounter for fracture with nonunion: Secondary | ICD-10-CM

## 2019-05-25 ENCOUNTER — Other Ambulatory Visit: Payer: Self-pay | Admitting: Family Medicine

## 2019-05-25 ENCOUNTER — Other Ambulatory Visit: Payer: Self-pay | Admitting: Psychiatry

## 2019-05-25 DIAGNOSIS — F410 Panic disorder [episodic paroxysmal anxiety] without agoraphobia: Secondary | ICD-10-CM

## 2019-05-25 DIAGNOSIS — F4010 Social phobia, unspecified: Secondary | ICD-10-CM

## 2019-05-25 DIAGNOSIS — F331 Major depressive disorder, recurrent, moderate: Secondary | ICD-10-CM

## 2019-05-25 DIAGNOSIS — F411 Generalized anxiety disorder: Secondary | ICD-10-CM

## 2019-05-25 DIAGNOSIS — F4310 Post-traumatic stress disorder, unspecified: Secondary | ICD-10-CM

## 2019-05-26 MED ORDER — BUSPIRONE HCL 30 MG PO TABS: 30.0000 mg | ORAL_TABLET | Freq: Two times a day (BID) | ORAL | 1 refills | Status: DC

## 2019-05-26 MED ORDER — BUPROPION HCL ER (SR) 100 MG PO TB12: 100.0000 mg | ORAL_TABLET | Freq: Two times a day (BID) | ORAL | 1 refills | Status: DC

## 2019-05-26 NOTE — Telephone Encounter (Signed)
Sent bupropion and buspar to pharmacy for 90 days.

## 2019-06-01 ENCOUNTER — Ambulatory Visit: Payer: Medicare Other

## 2019-06-01 ENCOUNTER — Ambulatory Visit
Admission: RE | Admit: 2019-06-01 | Discharge: 2019-06-01 | Disposition: A | Discharge status: Home / Self Care | Payer: Medicare Other | Source: Ambulatory Visit | Attending: Orthopedic Surgery | Admitting: Orthopedic Surgery

## 2019-06-01 ENCOUNTER — Other Ambulatory Visit: Payer: Self-pay

## 2019-06-01 DIAGNOSIS — S129XXA Fracture of neck, unspecified, initial encounter: Secondary | ICD-10-CM | POA: Diagnosis not present

## 2019-06-01 DIAGNOSIS — M4807 Spinal stenosis, lumbosacral region: Secondary | ICD-10-CM | POA: Diagnosis not present

## 2019-06-01 DIAGNOSIS — M5127 Other intervertebral disc displacement, lumbosacral region: Secondary | ICD-10-CM | POA: Diagnosis not present

## 2019-06-01 DIAGNOSIS — S32009K Unspecified fracture of unspecified lumbar vertebra, subsequent encounter for fracture with nonunion: Secondary | ICD-10-CM | POA: Diagnosis not present

## 2019-06-01 DIAGNOSIS — M5031 Other cervical disc degeneration,  high cervical region: Secondary | ICD-10-CM | POA: Diagnosis not present

## 2019-06-01 DIAGNOSIS — M47817 Spondylosis without myelopathy or radiculopathy, lumbosacral region: Secondary | ICD-10-CM | POA: Diagnosis not present

## 2019-06-04 ENCOUNTER — Ambulatory Visit (INDEPENDENT_AMBULATORY_CARE_PROVIDER_SITE_OTHER): Payer: Medicare Other | Admitting: Psychiatry

## 2019-06-04 ENCOUNTER — Encounter: Payer: Self-pay | Admitting: Psychiatry

## 2019-06-04 ENCOUNTER — Other Ambulatory Visit: Payer: Self-pay

## 2019-06-04 DIAGNOSIS — F331 Major depressive disorder, recurrent, moderate: Secondary | ICD-10-CM | POA: Insufficient documentation

## 2019-06-04 DIAGNOSIS — F410 Panic disorder [episodic paroxysmal anxiety] without agoraphobia: Secondary | ICD-10-CM | POA: Diagnosis not present

## 2019-06-04 DIAGNOSIS — F411 Generalized anxiety disorder: Secondary | ICD-10-CM | POA: Diagnosis not present

## 2019-06-04 DIAGNOSIS — F4310 Post-traumatic stress disorder, unspecified: Secondary | ICD-10-CM

## 2019-06-04 DIAGNOSIS — F4010 Social phobia, unspecified: Secondary | ICD-10-CM | POA: Diagnosis not present

## 2019-06-04 HISTORY — DX: Major depressive disorder, recurrent, moderate: F33.1

## 2019-06-04 MED ORDER — PROPRANOLOL HCL 20 MG PO TABS: 20.0000 mg | ORAL_TABLET | Freq: Two times a day (BID) | ORAL | 0 refills | Status: DC | PRN

## 2019-06-04 MED ORDER — RISPERIDONE 0.5 MG PO TABS: 0.5000 mg | ORAL_TABLET | ORAL | 0 refills | Status: DC
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
## 2019-06-08 ENCOUNTER — Other Ambulatory Visit (INDEPENDENT_AMBULATORY_CARE_PROVIDER_SITE_OTHER): Payer: Medicare Other

## 2019-06-08 ENCOUNTER — Other Ambulatory Visit: Payer: Self-pay

## 2019-06-08 DIAGNOSIS — I10 Essential (primary) hypertension: Secondary | ICD-10-CM

## 2019-06-08 DIAGNOSIS — R946 Abnormal results of thyroid function studies: Secondary | ICD-10-CM | POA: Diagnosis not present

## 2019-06-08 DIAGNOSIS — M5416 Radiculopathy, lumbar region: Secondary | ICD-10-CM | POA: Diagnosis not present

## 2019-06-08 DIAGNOSIS — Z1389 Encounter for screening for other disorder: Secondary | ICD-10-CM | POA: Diagnosis not present

## 2019-06-08 DIAGNOSIS — E785 Hyperlipidemia, unspecified: Secondary | ICD-10-CM | POA: Diagnosis not present

## 2019-06-08 DIAGNOSIS — M542 Cervicalgia: Secondary | ICD-10-CM | POA: Diagnosis not present

## 2019-06-08 LAB — COMPREHENSIVE METABOLIC PANEL
ALT: 15 U/L (ref 0–35)
AST: 11 U/L (ref 0–37)
Albumin: 4 g/dL (ref 3.5–5.2)
Alkaline Phosphatase: 101 U/L (ref 39–117)
BUN: 25 mg/dL — ABNORMAL HIGH (ref 6–23)
CO2: 27 mEq/L (ref 19–32)
Calcium: 9 mg/dL (ref 8.4–10.5)
Chloride: 107 mEq/L (ref 96–112)
Creatinine, Ser: 1.22 mg/dL — ABNORMAL HIGH (ref 0.40–1.20)
GFR: 46.45 mL/min — ABNORMAL LOW (ref >60.00)
Glucose, Bld: 90 mg/dL (ref 70–99)
Potassium: 3.7 mEq/L (ref 3.5–5.1)
Sodium: 142 mEq/L (ref 135–145)
Total Bilirubin: 0.4 mg/dL (ref 0.2–1.2)
Total Protein: 6.1 g/dL (ref 6.0–8.3)

## 2019-06-08 LAB — CBC WITH DIFFERENTIAL/PLATELET
Basophils Absolute: 0 10*3/uL (ref 0.0–0.1)
Basophils Relative: 0.8 % (ref 0.0–3.0)
Eosinophils Absolute: 0.1 10*3/uL (ref 0.0–0.7)
Eosinophils Relative: 2.4 % (ref 0.0–5.0)
HCT: 43.2 % (ref 36.0–46.0)
Hemoglobin: 14.5 g/dL (ref 12.0–15.0)
Lymphocytes Relative: 27.3 % (ref 12.0–46.0)
Lymphs Abs: 1.3 10*3/uL (ref 0.7–4.0)
MCHC: 33.5 g/dL (ref 30.0–36.0)
MCV: 90.2 fl (ref 78.0–100.0)
Monocytes Absolute: 0.4 10*3/uL (ref 0.1–1.0)
Monocytes Relative: 8.1 % (ref 3.0–12.0)
Neutro Abs: 3 10*3/uL (ref 1.4–7.7)
Neutrophils Relative %: 61.4 % (ref 43.0–77.0)
Platelets: 201 10*3/uL (ref 150.0–400.0)
RBC: 4.79 Mil/uL (ref 3.87–5.11)
RDW: 13.9 % (ref 11.5–15.5)
WBC: 4.9 10*3/uL (ref 4.0–10.5)

## 2019-06-08 LAB — LIPID PANEL
Cholesterol: 199 mg/dL (ref 0–200)
HDL: 53.4 mg/dL (ref >39.00)
LDL Cholesterol: 125 mg/dL — ABNORMAL HIGH (ref 0–99)
NonHDL: 145.79
Total CHOL/HDL Ratio: 4
Triglycerides: 103 mg/dL (ref 0.0–149.0)
VLDL: 20.6 mg/dL (ref 0.0–40.0)

## 2019-06-08 LAB — T4, FREE: Free T4: 1.54 ng/dL (ref 0.60–1.60)

## 2019-06-08 LAB — TSH: TSH: 3.32 u[IU]/mL (ref 0.35–4.50)

## 2019-06-09 LAB — URINALYSIS, ROUTINE W REFLEX MICROSCOPIC
Bilirubin Urine: NEGATIVE
Glucose, UA: NEGATIVE
Hgb urine dipstick: NEGATIVE
Ketones, ur: NEGATIVE
Leukocytes,Ua: NEGATIVE
Nitrite: NEGATIVE
Protein, ur: NEGATIVE
Specific Gravity, Urine: 1.02 (ref 1.001–1.03)
pH: 6.5 (ref 5.0–8.0)

## 2019-06-23 ENCOUNTER — Other Ambulatory Visit: Payer: Self-pay | Admitting: Psychiatry

## 2019-06-23 ENCOUNTER — Other Ambulatory Visit: Payer: Self-pay | Admitting: Family Medicine

## 2019-06-23 DIAGNOSIS — F4010 Social phobia, unspecified: Secondary | ICD-10-CM

## 2019-06-23 DIAGNOSIS — F410 Panic disorder [episodic paroxysmal anxiety] without agoraphobia: Secondary | ICD-10-CM

## 2019-06-24 ENCOUNTER — Other Ambulatory Visit: Payer: Self-pay | Admitting: Family Medicine

## 2019-06-24 ENCOUNTER — Other Ambulatory Visit: Payer: Self-pay | Admitting: Internal Medicine

## 2019-06-24 DIAGNOSIS — M545 Low back pain, unspecified: Secondary | ICD-10-CM

## 2019-06-24 DIAGNOSIS — M62838 Other muscle spasm: Secondary | ICD-10-CM

## 2019-06-24 DIAGNOSIS — M25559 Pain in unspecified hip: Secondary | ICD-10-CM

## 2019-06-24 MED ORDER — TIZANIDINE HCL 4 MG PO TABS: 4.0000 mg | ORAL_TABLET | Freq: Three times a day (TID) | ORAL | 11 refills | Status: DC | PRN

## 2019-07-23 ENCOUNTER — Other Ambulatory Visit: Payer: Self-pay | Admitting: Family Medicine

## 2019-07-26 ENCOUNTER — Other Ambulatory Visit: Payer: Self-pay | Admitting: Family Medicine

## 2019-07-27 ENCOUNTER — Encounter: Payer: Self-pay | Admitting: Psychiatry

## 2019-07-27 ENCOUNTER — Other Ambulatory Visit: Payer: Self-pay

## 2019-07-27 ENCOUNTER — Ambulatory Visit (INDEPENDENT_AMBULATORY_CARE_PROVIDER_SITE_OTHER): Payer: Medicare Other | Admitting: Psychiatry

## 2019-07-27 DIAGNOSIS — F331 Major depressive disorder, recurrent, moderate: Secondary | ICD-10-CM

## 2019-07-27 DIAGNOSIS — F411 Generalized anxiety disorder: Secondary | ICD-10-CM

## 2019-07-27 DIAGNOSIS — F4010 Social phobia, unspecified: Secondary | ICD-10-CM

## 2019-07-27 DIAGNOSIS — F4310 Post-traumatic stress disorder, unspecified: Secondary | ICD-10-CM | POA: Diagnosis not present

## 2019-07-27 DIAGNOSIS — F410 Panic disorder [episodic paroxysmal anxiety] without agoraphobia: Secondary | ICD-10-CM

## 2019-07-27 HISTORY — DX: Panic disorder (episodic paroxysmal anxiety): F41.0

## 2019-07-27 MED ORDER — QUETIAPINE FUMARATE 50 MG PO TABS: 25.0000 mg | ORAL_TABLET | Freq: Every day | ORAL | 0 refills | Status: DC

## 2019-07-27 MED ORDER — BENZTROPINE MESYLATE 0.5 MG PO TABS: 0.5000 mg | ORAL_TABLET | Freq: Every day | ORAL | 0 refills | Status: DC | PRN
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
## 2019-08-18 ENCOUNTER — Ambulatory Visit: Payer: Medicare Other | Admitting: Licensed Clinical Social Worker

## 2019-08-24 ENCOUNTER — Ambulatory Visit (INDEPENDENT_AMBULATORY_CARE_PROVIDER_SITE_OTHER): Payer: Medicare Other | Admitting: Internal Medicine

## 2019-08-24 ENCOUNTER — Telehealth: Payer: Self-pay | Admitting: Psychiatry

## 2019-08-24 ENCOUNTER — Other Ambulatory Visit: Payer: Self-pay

## 2019-08-24 DIAGNOSIS — Z1211 Encounter for screening for malignant neoplasm of colon: Secondary | ICD-10-CM | POA: Diagnosis not present

## 2019-08-24 DIAGNOSIS — R159 Full incontinence of feces: Secondary | ICD-10-CM | POA: Diagnosis not present

## 2019-08-24 DIAGNOSIS — G8929 Other chronic pain: Secondary | ICD-10-CM | POA: Diagnosis not present

## 2019-08-24 DIAGNOSIS — F331 Major depressive disorder, recurrent, moderate: Secondary | ICD-10-CM | POA: Diagnosis not present

## 2019-08-24 DIAGNOSIS — G894 Chronic pain syndrome: Secondary | ICD-10-CM | POA: Diagnosis not present

## 2019-08-24 DIAGNOSIS — F4310 Post-traumatic stress disorder, unspecified: Secondary | ICD-10-CM

## 2019-08-24 DIAGNOSIS — E611 Iron deficiency: Secondary | ICD-10-CM | POA: Diagnosis not present

## 2019-08-24 DIAGNOSIS — G4701 Insomnia due to medical condition: Secondary | ICD-10-CM | POA: Insufficient documentation

## 2019-08-24 DIAGNOSIS — F411 Generalized anxiety disorder: Secondary | ICD-10-CM

## 2019-08-24 DIAGNOSIS — G4700 Insomnia, unspecified: Secondary | ICD-10-CM

## 2019-08-24 DIAGNOSIS — F4010 Social phobia, unspecified: Secondary | ICD-10-CM

## 2019-08-24 HISTORY — DX: Insomnia due to medical condition: G47.01

## 2019-08-24 MED ORDER — BREXPIPRAZOLE 0.25 MG PO TABS: 0.2500 mg | ORAL_TABLET | Freq: Every day | ORAL | 1 refills | Status: DC

## 2019-08-24 MED ORDER — TRAZODONE HCL 100 MG PO TABS: 100.0000 mg | ORAL_TABLET | Freq: Every evening | ORAL | 1 refills | Status: DC | PRN

## 2019-08-24 NOTE — Telephone Encounter (Signed)
-----   Message from Delorise Jackson, MD sent at 08/24/2019 10:51 AM EDT ----- Increased anxiety and reduced sleepCan she see therapist more frequently Erika Hanson seeing 1x per month Also trouble sleeping coming off trazadone
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
## 2019-08-24 NOTE — Progress Notes (Signed)
I will reach out to her ,thanks for letting me know.

## 2019-08-24 NOTE — Telephone Encounter (Signed)
Return call to patient after receiving message from her primary care provider. Patient reports she is not sleeping.  Seroquel is making her anxiety and depression worse.  Discussed stopping Seroquel.  Restart trazodone 100 to 150 mg  Start Rexulti 0.25 mg p.o. nightly  Discussed with her to reach out to Ms. Miguel Dibble to restart more frequent psychotherapy sessions.  Gave her the phone number.  She has an upcoming appointment with me in 3 to 4 days from now.

## 2019-08-26 ENCOUNTER — Other Ambulatory Visit: Payer: Self-pay

## 2019-08-26 ENCOUNTER — Encounter: Payer: Self-pay | Admitting: Psychiatry

## 2019-08-26 ENCOUNTER — Ambulatory Visit: Payer: Medicare Other | Admitting: Licensed Clinical Social Worker

## 2019-08-26 ENCOUNTER — Other Ambulatory Visit: Payer: Self-pay | Admitting: Internal Medicine

## 2019-08-26 ENCOUNTER — Ambulatory Visit (INDEPENDENT_AMBULATORY_CARE_PROVIDER_SITE_OTHER): Payer: Medicare Other | Admitting: Psychiatry

## 2019-08-26 DIAGNOSIS — G8929 Other chronic pain: Secondary | ICD-10-CM

## 2019-08-26 DIAGNOSIS — K581 Irritable bowel syndrome with constipation: Secondary | ICD-10-CM

## 2019-08-26 DIAGNOSIS — F331 Major depressive disorder, recurrent, moderate: Secondary | ICD-10-CM

## 2019-08-26 DIAGNOSIS — F410 Panic disorder [episodic paroxysmal anxiety] without agoraphobia: Secondary | ICD-10-CM | POA: Diagnosis not present

## 2019-08-26 DIAGNOSIS — F411 Generalized anxiety disorder: Secondary | ICD-10-CM

## 2019-08-26 DIAGNOSIS — F4310 Post-traumatic stress disorder, unspecified: Secondary | ICD-10-CM

## 2019-08-26 DIAGNOSIS — F4010 Social phobia, unspecified: Secondary | ICD-10-CM

## 2019-08-26 MED ORDER — LINACLOTIDE 290 MCG PO CAPS: 290.0000 ug | ORAL_CAPSULE | Freq: Every day | ORAL | 3 refills | Status: DC

## 2019-08-26 MED ORDER — TRAMADOL HCL 50 MG PO TABS: 100.0000 mg | ORAL_TABLET | Freq: Every day | ORAL | 2 refills | Status: DC | PRN
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
## 2019-09-02 ENCOUNTER — Other Ambulatory Visit: Payer: Self-pay

## 2019-09-02 ENCOUNTER — Ambulatory Visit: Payer: Medicare Other | Admitting: Licensed Clinical Social Worker

## 2019-09-08 ENCOUNTER — Other Ambulatory Visit: Payer: Self-pay | Admitting: Family Medicine

## 2019-09-10 ENCOUNTER — Other Ambulatory Visit: Payer: Self-pay | Admitting: Internal Medicine

## 2019-09-10 DIAGNOSIS — I10 Essential (primary) hypertension: Secondary | ICD-10-CM

## 2019-09-10 MED ORDER — HYDROCHLOROTHIAZIDE 25 MG PO TABS: 25.0000 mg | ORAL_TABLET | Freq: Every day | ORAL | 3 refills | Status: DC

## 2019-09-29 ENCOUNTER — Encounter: Payer: Self-pay | Admitting: Psychiatry

## 2019-09-29 ENCOUNTER — Other Ambulatory Visit: Payer: Self-pay

## 2019-09-29 ENCOUNTER — Ambulatory Visit (INDEPENDENT_AMBULATORY_CARE_PROVIDER_SITE_OTHER): Payer: Medicare Other | Admitting: Psychiatry

## 2019-09-29 DIAGNOSIS — F4010 Social phobia, unspecified: Secondary | ICD-10-CM | POA: Diagnosis not present

## 2019-09-29 DIAGNOSIS — F410 Panic disorder [episodic paroxysmal anxiety] without agoraphobia: Secondary | ICD-10-CM

## 2019-09-29 DIAGNOSIS — F411 Generalized anxiety disorder: Secondary | ICD-10-CM

## 2019-09-29 DIAGNOSIS — F331 Major depressive disorder, recurrent, moderate: Secondary | ICD-10-CM

## 2019-09-29 DIAGNOSIS — F4310 Post-traumatic stress disorder, unspecified: Secondary | ICD-10-CM

## 2019-09-29 MED ORDER — LAMOTRIGINE 100 MG PO TABS: 50.0000 mg | ORAL_TABLET | Freq: Every day | ORAL | 1 refills | Status: DC

## 2019-09-29 MED ORDER — ZOLPIDEM TARTRATE 5 MG PO TABS: 5.0000 mg | ORAL_TABLET | Freq: Every evening | ORAL | 0 refills | Status: DC | PRN
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
## 2019-10-13 ENCOUNTER — Encounter: Payer: Self-pay | Admitting: Psychiatry

## 2019-10-13 ENCOUNTER — Other Ambulatory Visit: Payer: Self-pay | Admitting: Psychiatry

## 2019-10-13 DIAGNOSIS — F4310 Post-traumatic stress disorder, unspecified: Secondary | ICD-10-CM

## 2019-10-13 DIAGNOSIS — F410 Panic disorder [episodic paroxysmal anxiety] without agoraphobia: Secondary | ICD-10-CM

## 2019-10-13 DIAGNOSIS — F331 Major depressive disorder, recurrent, moderate: Secondary | ICD-10-CM

## 2019-10-13 DIAGNOSIS — F411 Generalized anxiety disorder: Secondary | ICD-10-CM

## 2019-10-13 DIAGNOSIS — F4010 Social phobia, unspecified: Secondary | ICD-10-CM

## 2019-10-13 MED ORDER — BREXPIPRAZOLE 0.25 MG PO TABS: 0.2500 mg | ORAL_TABLET | Freq: Every day | ORAL | 1 refills | Status: DC

## 2019-10-13 NOTE — Telephone Encounter (Signed)
Sent Rexulti to pharmacy.

## 2019-10-20 ENCOUNTER — Other Ambulatory Visit: Payer: Self-pay | Admitting: Psychiatry

## 2019-10-20 DIAGNOSIS — F331 Major depressive disorder, recurrent, moderate: Secondary | ICD-10-CM

## 2019-10-20 DIAGNOSIS — F411 Generalized anxiety disorder: Secondary | ICD-10-CM

## 2019-10-20 DIAGNOSIS — F4310 Post-traumatic stress disorder, unspecified: Secondary | ICD-10-CM

## 2019-10-20 MED ORDER — BUPROPION HCL ER (SR) 100 MG PO TB12: 100.0000 mg | ORAL_TABLET | Freq: Two times a day (BID) | ORAL | 1 refills | Status: DC

## 2019-10-20 NOTE — Telephone Encounter (Signed)
Sent Bupropion to pharmacy

## 2019-10-28 ENCOUNTER — Other Ambulatory Visit: Payer: Self-pay

## 2019-10-28 ENCOUNTER — Ambulatory Visit (INDEPENDENT_AMBULATORY_CARE_PROVIDER_SITE_OTHER): Payer: Medicare Other | Admitting: Psychiatry

## 2019-10-28 DIAGNOSIS — Z5329 Procedure and treatment not carried out because of patient's decision for other reasons: Secondary | ICD-10-CM

## 2019-10-28 DIAGNOSIS — Z91199 Patient's noncompliance with other medical treatment and regimen due to unspecified reason: Secondary | ICD-10-CM | POA: Insufficient documentation

## 2019-10-28 HISTORY — DX: Patient's noncompliance with other medical treatment and regimen due to unspecified reason: Z91.199

## 2019-10-28 NOTE — Progress Notes (Signed)
     No response to calls or text.

## 2019-11-03 ENCOUNTER — Other Ambulatory Visit: Payer: Self-pay | Admitting: Psychiatry

## 2019-11-03 DIAGNOSIS — F4010 Social phobia, unspecified: Secondary | ICD-10-CM

## 2019-11-03 DIAGNOSIS — F410 Panic disorder [episodic paroxysmal anxiety] without agoraphobia: Secondary | ICD-10-CM

## 2019-11-12 ENCOUNTER — Other Ambulatory Visit: Payer: Self-pay | Admitting: Internal Medicine

## 2019-11-12 DIAGNOSIS — I10 Essential (primary) hypertension: Secondary | ICD-10-CM

## 2019-11-12 MED ORDER — LOSARTAN POTASSIUM 50 MG PO TABS: ORAL_TABLET | ORAL | 3 refills | Status: DC

## 2019-11-24 ENCOUNTER — Ambulatory Visit (INDEPENDENT_AMBULATORY_CARE_PROVIDER_SITE_OTHER): Payer: Medicare Other | Admitting: Internal Medicine

## 2019-11-24 ENCOUNTER — Other Ambulatory Visit: Payer: Self-pay

## 2019-11-24 VITALS — Ht 65.0 in | Wt 278.0 lb

## 2019-11-24 DIAGNOSIS — E785 Hyperlipidemia, unspecified: Secondary | ICD-10-CM

## 2019-11-24 DIAGNOSIS — J329 Chronic sinusitis, unspecified: Secondary | ICD-10-CM

## 2019-11-24 DIAGNOSIS — B3731 Acute candidiasis of vulva and vagina: Secondary | ICD-10-CM

## 2019-11-24 DIAGNOSIS — I7300 Raynaud's syndrome without gangrene: Secondary | ICD-10-CM

## 2019-11-24 DIAGNOSIS — H9209 Otalgia, unspecified ear: Secondary | ICD-10-CM

## 2019-11-24 DIAGNOSIS — I10 Essential (primary) hypertension: Secondary | ICD-10-CM

## 2019-11-24 DIAGNOSIS — B373 Candidiasis of vulva and vagina: Secondary | ICD-10-CM | POA: Diagnosis not present

## 2019-11-24 MED ORDER — AZITHROMYCIN 250 MG PO TABS: ORAL_TABLET | ORAL | 0 refills | Status: DC

## 2019-11-24 MED ORDER — LOSARTAN POTASSIUM 25 MG PO TABS: ORAL_TABLET | ORAL | 3 refills | Status: DC

## 2019-11-24 MED ORDER — FLUTICASONE PROPIONATE 50 MCG/ACT NA SUSP: 2.0000 | Freq: Every day | NASAL | 2 refills | Status: DC

## 2019-11-24 MED ORDER — AMLODIPINE BESYLATE 5 MG PO TABS: 5.0000 mg | ORAL_TABLET | Freq: Every day | ORAL | 3 refills | Status: DC

## 2019-11-24 MED ORDER — SALINE SPRAY 0.65 % NA SOLN: 2.0000 | NASAL | 0 refills | Status: DC | PRN

## 2019-11-24 MED ORDER — FLUCONAZOLE 150 MG PO TABS: 150.0000 mg | ORAL_TABLET | Freq: Once | ORAL | 0 refills | Status: AC
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
## 2019-12-08 ENCOUNTER — Encounter: Payer: Self-pay | Admitting: Internal Medicine

## 2019-12-08 ENCOUNTER — Other Ambulatory Visit: Payer: Self-pay | Admitting: Internal Medicine

## 2019-12-08 ENCOUNTER — Ambulatory Visit (INDEPENDENT_AMBULATORY_CARE_PROVIDER_SITE_OTHER): Payer: Medicare Other

## 2019-12-08 ENCOUNTER — Other Ambulatory Visit: Payer: Self-pay

## 2019-12-08 VITALS — Ht 65.0 in | Wt 278.0 lb

## 2019-12-08 DIAGNOSIS — J329 Chronic sinusitis, unspecified: Secondary | ICD-10-CM

## 2019-12-08 DIAGNOSIS — Z20822 Contact with and (suspected) exposure to covid-19: Secondary | ICD-10-CM

## 2019-12-08 DIAGNOSIS — Z0000 Encounter for general adult medical examination without abnormal findings: Secondary | ICD-10-CM | POA: Diagnosis not present

## 2019-12-08 DIAGNOSIS — H9209 Otalgia, unspecified ear: Secondary | ICD-10-CM

## 2019-12-08 MED ORDER — NEOMYCIN-POLYMYXIN-HC 3.5-10000-1 OT SOLN: 4.0000 [drp] | Freq: Four times a day (QID) | OTIC | 0 refills | Status: DC

## 2019-12-08 MED ORDER — LEVOFLOXACIN 750 MG PO TABS: 750.0000 mg | ORAL_TABLET | Freq: Every day | ORAL | 0 refills | Status: DC
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
## 2019-12-08 NOTE — Patient Instructions (Addendum)
  Erika Hanson , Thank you for taking time to come for your Medicare Wellness Visit. I appreciate your ongoing commitment to your health goals. Please review the following plan we discussed and let me know if I can assist you in the future.   These are the goals we discussed: Goals      Patient Stated   . Increase physical activity (pt-stated)     Leg strengthening exercises       This is a list of the screening recommended for you and due dates:  Health Maintenance  Topic Date Due  . Mammogram  04/10/2019  . Flu Shot  02/23/2020*  . Colon Cancer Screening  12/07/2020*  . Pap Smear  08/26/2021  . Tetanus Vaccine  06/18/2028  . HIV Screening  Completed  *Topic was postponed. The date shown is not the original due date.

## 2019-12-09 ENCOUNTER — Other Ambulatory Visit: Payer: Self-pay | Admitting: Psychiatry

## 2019-12-09 ENCOUNTER — Ambulatory Visit: Payer: Medicare Other | Attending: Internal Medicine

## 2019-12-09 ENCOUNTER — Other Ambulatory Visit: Payer: Self-pay

## 2019-12-09 DIAGNOSIS — F331 Major depressive disorder, recurrent, moderate: Secondary | ICD-10-CM

## 2019-12-09 DIAGNOSIS — Z20822 Contact with and (suspected) exposure to covid-19: Secondary | ICD-10-CM

## 2019-12-09 DIAGNOSIS — F4310 Post-traumatic stress disorder, unspecified: Secondary | ICD-10-CM

## 2019-12-09 DIAGNOSIS — F411 Generalized anxiety disorder: Secondary | ICD-10-CM

## 2019-12-10 ENCOUNTER — Ambulatory Visit (INDEPENDENT_AMBULATORY_CARE_PROVIDER_SITE_OTHER): Payer: Medicare Other | Admitting: Psychiatry

## 2019-12-10 ENCOUNTER — Other Ambulatory Visit: Payer: Self-pay

## 2019-12-10 ENCOUNTER — Encounter: Payer: Self-pay | Admitting: Psychiatry

## 2019-12-10 DIAGNOSIS — F4010 Social phobia, unspecified: Secondary | ICD-10-CM

## 2019-12-10 DIAGNOSIS — G2111 Neuroleptic induced parkinsonism: Secondary | ICD-10-CM

## 2019-12-10 DIAGNOSIS — F411 Generalized anxiety disorder: Secondary | ICD-10-CM

## 2019-12-10 DIAGNOSIS — T43505A Adverse effect of unspecified antipsychotics and neuroleptics, initial encounter: Secondary | ICD-10-CM

## 2019-12-10 DIAGNOSIS — F3342 Major depressive disorder, recurrent, in full remission: Secondary | ICD-10-CM

## 2019-12-10 DIAGNOSIS — F410 Panic disorder [episodic paroxysmal anxiety] without agoraphobia: Secondary | ICD-10-CM

## 2019-12-10 DIAGNOSIS — F4310 Post-traumatic stress disorder, unspecified: Secondary | ICD-10-CM | POA: Diagnosis not present

## 2019-12-10 HISTORY — DX: Adverse effect of unspecified antipsychotics and neuroleptics, initial encounter: T43.505A

## 2019-12-10 HISTORY — DX: Neuroleptic induced parkinsonism: G21.11

## 2019-12-10 LAB — NOVEL CORONAVIRUS, NAA: SARS-CoV-2, NAA: NOT DETECTED

## 2019-12-10 MED ORDER — BUSPIRONE HCL 30 MG PO TABS: 30.0000 mg | ORAL_TABLET | Freq: Two times a day (BID) | ORAL | 1 refills | Status: DC

## 2019-12-10 MED ORDER — ZOLPIDEM TARTRATE 10 MG PO TABS: 10.0000 mg | ORAL_TABLET | Freq: Every evening | ORAL | 0 refills | Status: DC | PRN

## 2019-12-10 MED ORDER — BENZTROPINE MESYLATE 0.5 MG PO TABS: 0.5000 mg | ORAL_TABLET | Freq: Every day | ORAL | 1 refills | Status: DC | PRN

## 2019-12-10 MED ORDER — BREXPIPRAZOLE 0.25 MG PO TABS: 0.2500 mg | ORAL_TABLET | Freq: Every day | ORAL | 0 refills | Status: DC
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
## 2019-12-20 ENCOUNTER — Encounter: Payer: Self-pay | Admitting: Internal Medicine

## 2019-12-20 ENCOUNTER — Other Ambulatory Visit: Payer: Self-pay | Admitting: Internal Medicine

## 2019-12-20 DIAGNOSIS — H9209 Otalgia, unspecified ear: Secondary | ICD-10-CM

## 2019-12-22 ENCOUNTER — Other Ambulatory Visit: Payer: Self-pay | Admitting: Internal Medicine

## 2019-12-22 DIAGNOSIS — G8929 Other chronic pain: Secondary | ICD-10-CM

## 2019-12-22 MED ORDER — TRAMADOL HCL 50 MG PO TABS: 100.0000 mg | ORAL_TABLET | Freq: Every day | ORAL | 2 refills | Status: DC | PRN

## 2019-12-24 DIAGNOSIS — K029 Dental caries, unspecified: Secondary | ICD-10-CM | POA: Diagnosis not present

## 2019-12-24 DIAGNOSIS — H9202 Otalgia, left ear: Secondary | ICD-10-CM | POA: Diagnosis not present

## 2019-12-24 DIAGNOSIS — H6982 Other specified disorders of Eustachian tube, left ear: Secondary | ICD-10-CM | POA: Diagnosis not present

## 2019-12-24 IMAGING — MR MR CERVICAL SPINE W/O CM
5 series · 33 of 48 positions shown · non-contrast
Comparison: Cervical MRI 03/10/2017.

CLINICAL DATA: Central and left-sided neck pain with bilateral hand
numbness for 2 years.

EXAM:
MRI CERVICAL SPINE WITHOUT CONTRAST
TECHNIQUE: Multiplanar, multisequence MR imaging of the cervical spine was
performed. No intravenous contrast was administered.

[Series 2: T2 · sagittal · 3.0mm · 0.56mm/px · 6 of 13 slices shown (1 of 2)]
[im 1/13]
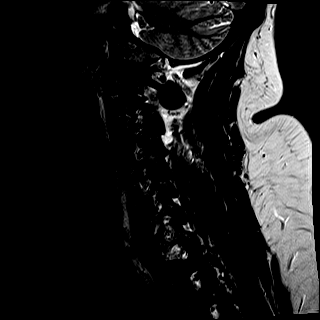
[im 3/13]
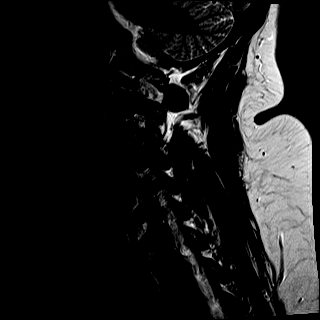
[im 5/13]
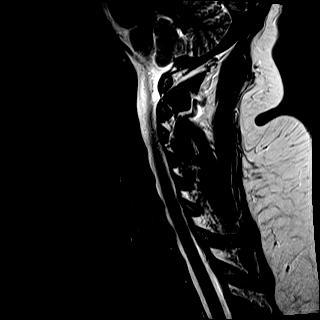
[im 8/13]
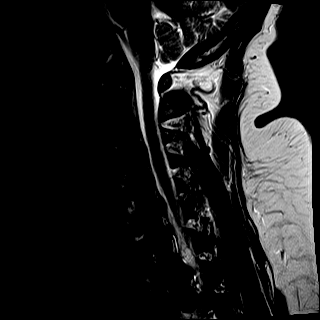
[im 10/13]
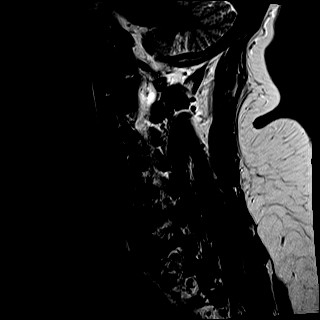
[im 13/13]
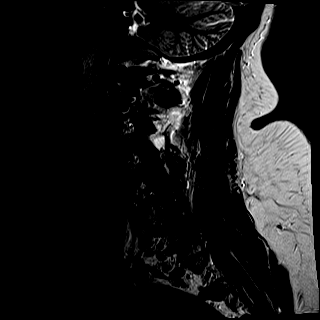

[Series 3: T1 · sagittal · 3.0mm · 0.70mm/px · 7 of 13 slices shown]
[im 1/13]
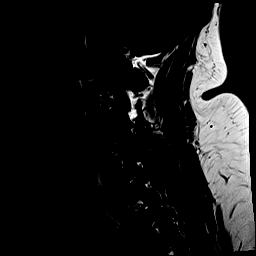
[im 3/13]
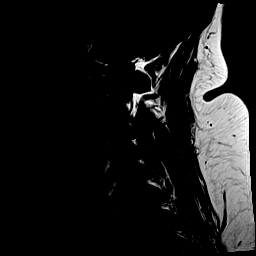
[im 5/13]
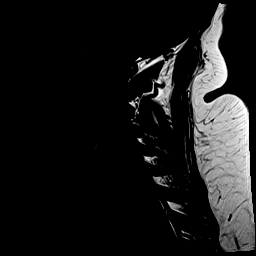
[im 7/13]
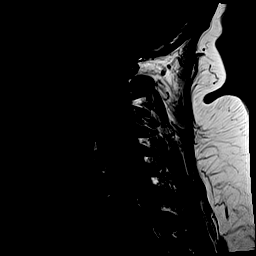
[im 9/13]
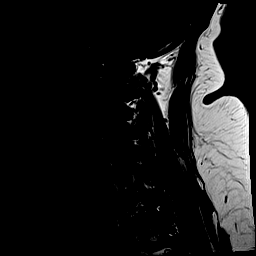
[im 11/13]
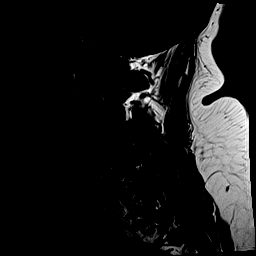
[im 13/13]
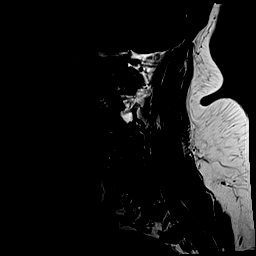

[Series 4: STIR · sagittal · 3.0mm · 0.35mm/px · 7 of 13 slices shown]
[im 1/13]
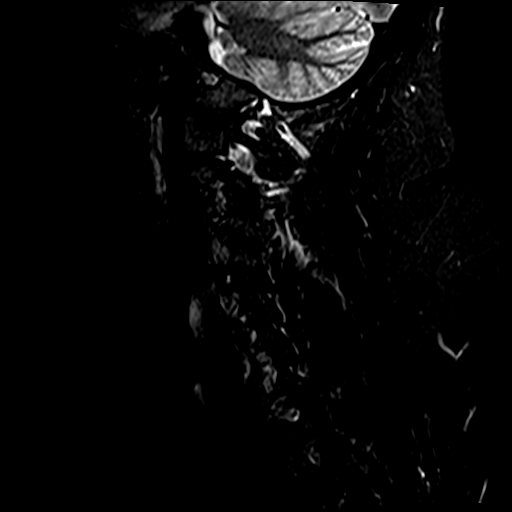
[im 3/13]
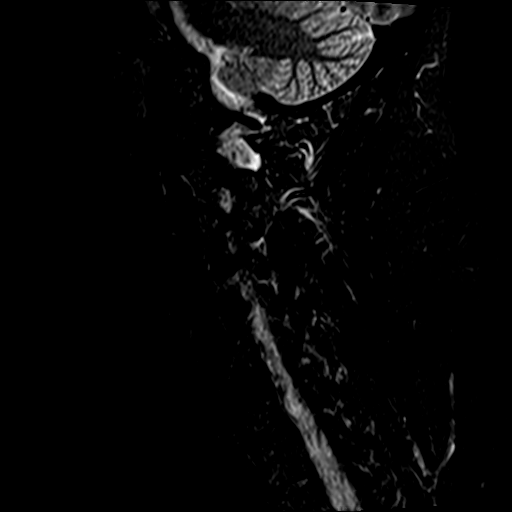
[im 5/13]
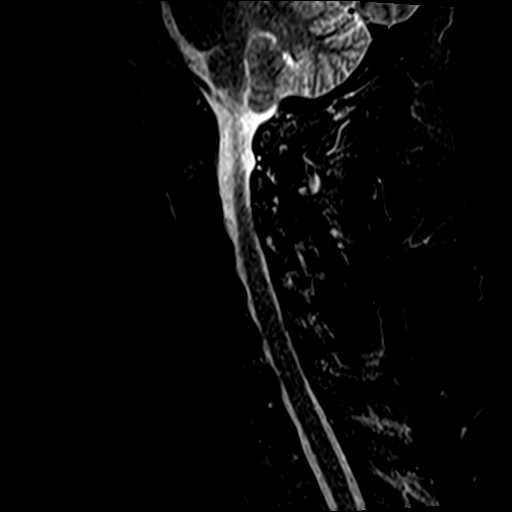
[im 7/13]
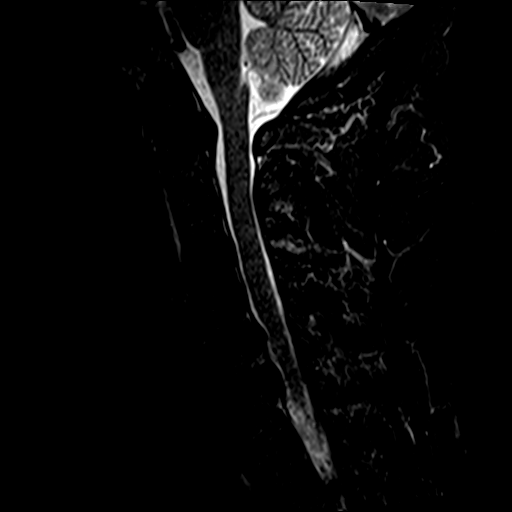
[im 9/13]
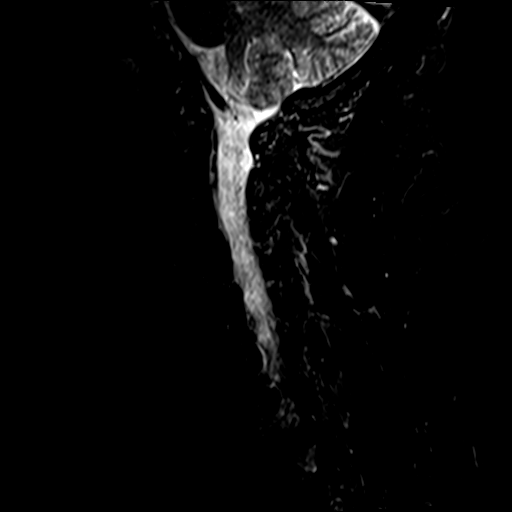
[im 11/13]
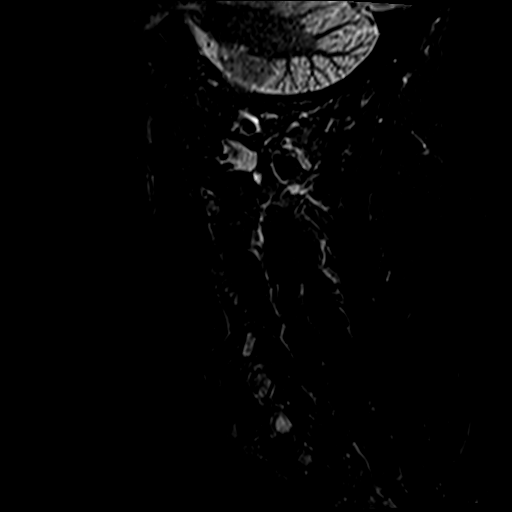
[im 13/13]
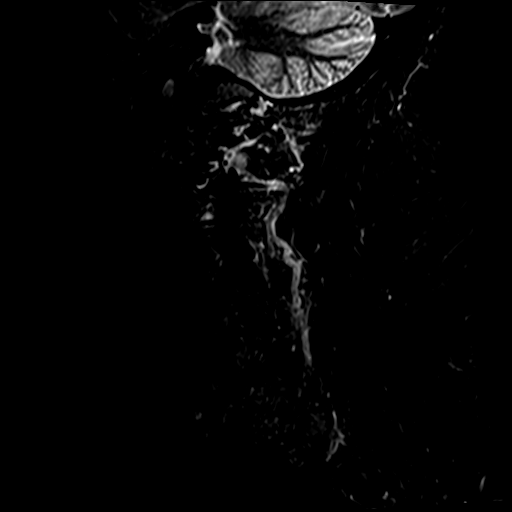

[Series 5: T2 · axial · 3.0mm · 0.62mm/px · z∈[-108,-18]mm · 8 of 26 slices shown (2 of 2)]
[im 1/26]
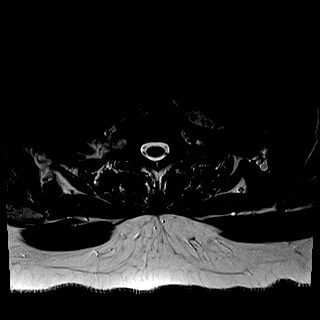
[im 4/26]
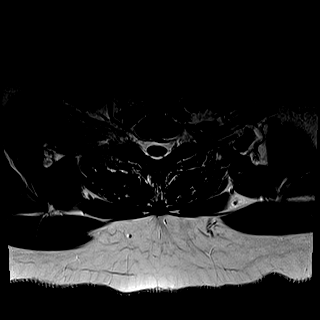
[im 8/26]
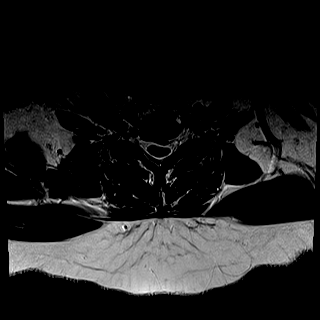
[im 12/26]
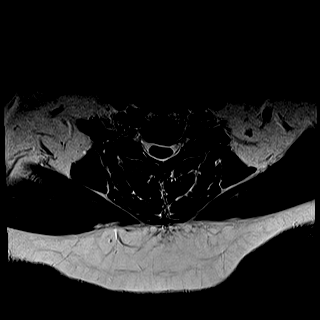
[im 14/26]
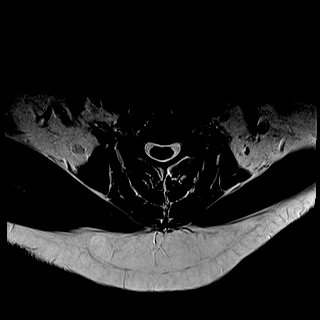
[im 18/26]
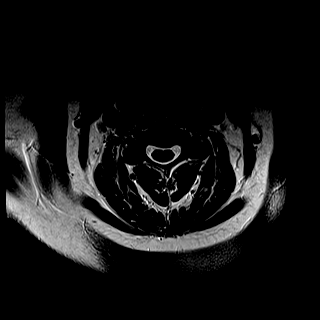
[im 22/26]
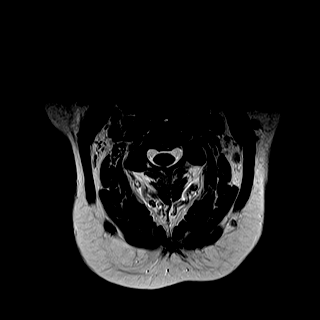
[im 26/26]
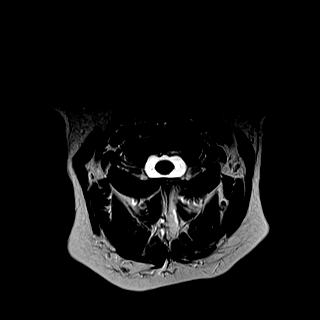

[Series 6: mpgr ax · axial · 3.0mm · 0.39mm/px · z∈[-99,-52]mm · 5 of 26 slices shown]
[im 1/26]
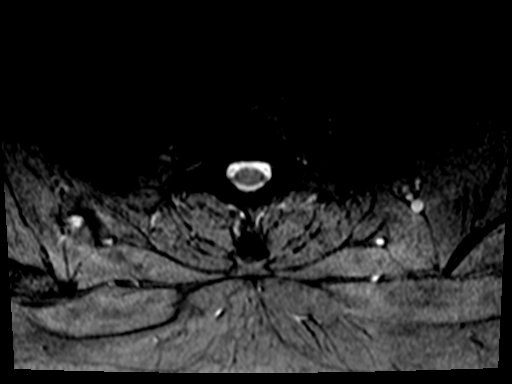
[im 4/26]
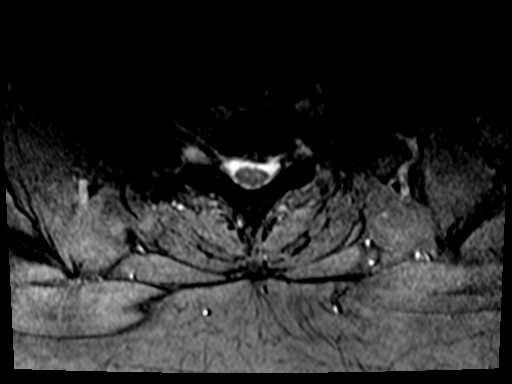
[im 8/26]
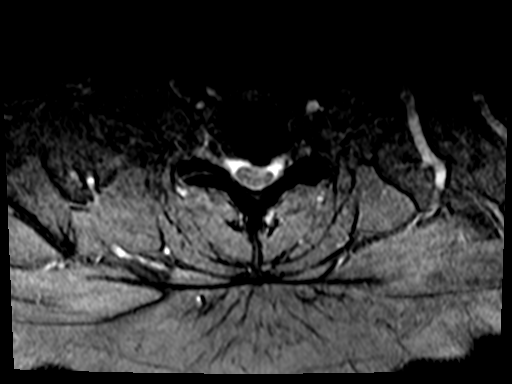
[im 12/26]
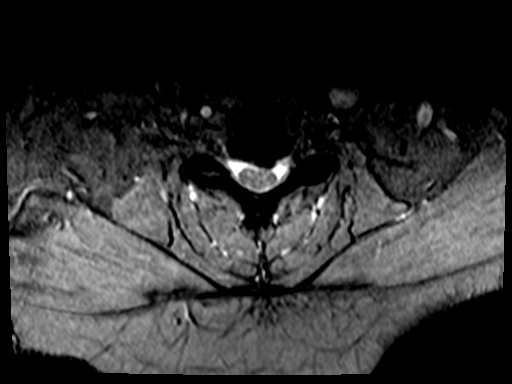
[im 14/26]
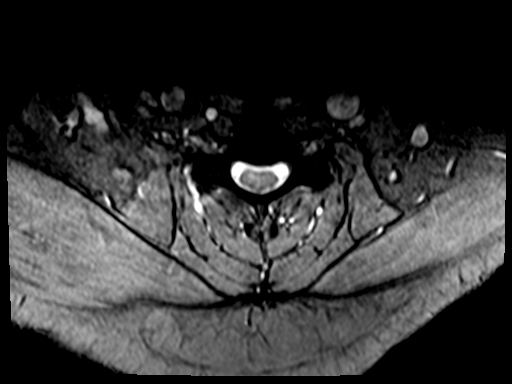

[33 of 48 positions shown; findings below may reference images not displayed]

FINDINGS: Alignment: Stable with straightening, but no focal angulation or
listhesis.

Vertebrae: No acute or suspicious osseous findings. The spinal canal
is relatively small on a congenital basis.

Cord: Normal in signal and caliber.

Posterior Fossa, vertebral arteries, paraspinal tissues: Visualized
portions of the posterior fossa and paraspinal soft tissues appear
unremarkable. Bilateral vertebral artery flow voids.

Disc levels:

C2-3: Normal interspace.

C3-4: Mild disc bulging, uncinate spurring and facet hypertrophy.
Mild left foraminal narrowing.

C4-5: Mild disc bulging, uncinate spurring and facet hypertrophy. No
spinal stenosis or nerve root encroachment.

C5-6: Chronic left paracentral disc protrusion, flattening the
ventral surface of the cord. Both foramina are patent.

C6-7: Chronic left paracentral disc protrusion, flattening the
ventral surface of the cord. Both foramina are patent.

C7-T1: Normal interspace.
IMPRESSION: 1. Stable left paracentral disc protrusions at C5-6 and C6-7 with
stable mild cord flattening. No nerve root encroachment.
2. No abnormal cord signal.
3. No acute findings. Stable mild left foraminal narrowing at C3-4
and scattered uncinate spurring and facet hypertrophy.

## 2019-12-30 ENCOUNTER — Other Ambulatory Visit (INDEPENDENT_AMBULATORY_CARE_PROVIDER_SITE_OTHER): Payer: Medicare Other

## 2019-12-30 ENCOUNTER — Other Ambulatory Visit: Payer: Self-pay

## 2019-12-30 DIAGNOSIS — E785 Hyperlipidemia, unspecified: Secondary | ICD-10-CM | POA: Diagnosis not present

## 2019-12-30 DIAGNOSIS — I10 Essential (primary) hypertension: Secondary | ICD-10-CM | POA: Diagnosis not present

## 2019-12-30 LAB — COMPREHENSIVE METABOLIC PANEL
ALT: 16 U/L (ref 0–35)
AST: 12 U/L (ref 0–37)
Albumin: 4 g/dL (ref 3.5–5.2)
Alkaline Phosphatase: 113 U/L (ref 39–117)
BUN: 17 mg/dL (ref 6–23)
CO2: 32 mEq/L (ref 19–32)
Calcium: 9.7 mg/dL (ref 8.4–10.5)
Chloride: 102 mEq/L (ref 96–112)
Creatinine, Ser: 1.21 mg/dL — ABNORMAL HIGH (ref 0.40–1.20)
GFR: 46.79 mL/min — ABNORMAL LOW (ref >60.00)
Glucose, Bld: 77 mg/dL (ref 70–99)
Potassium: 3.9 mEq/L (ref 3.5–5.1)
Sodium: 139 mEq/L (ref 135–145)
Total Bilirubin: 0.3 mg/dL (ref 0.2–1.2)
Total Protein: 7 g/dL (ref 6.0–8.3)

## 2019-12-30 LAB — CBC WITH DIFFERENTIAL/PLATELET
Basophils Absolute: 0.1 10*3/uL (ref 0.0–0.1)
Basophils Relative: 1.1 % (ref 0.0–3.0)
Eosinophils Absolute: 0.2 10*3/uL (ref 0.0–0.7)
Eosinophils Relative: 2.7 % (ref 0.0–5.0)
HCT: 45.1 % (ref 36.0–46.0)
Hemoglobin: 15.1 g/dL — ABNORMAL HIGH (ref 12.0–15.0)
Lymphocytes Relative: 20.9 % (ref 12.0–46.0)
Lymphs Abs: 1.3 10*3/uL (ref 0.7–4.0)
MCHC: 33.5 g/dL (ref 30.0–36.0)
MCV: 89.9 fl (ref 78.0–100.0)
Monocytes Absolute: 0.4 10*3/uL (ref 0.1–1.0)
Monocytes Relative: 6.3 % (ref 3.0–12.0)
Neutro Abs: 4.1 10*3/uL (ref 1.4–7.7)
Neutrophils Relative %: 69 % (ref 43.0–77.0)
Platelets: 221 10*3/uL (ref 150.0–400.0)
RBC: 5.02 Mil/uL (ref 3.87–5.11)
RDW: 14 % (ref 11.5–15.5)
WBC: 6 10*3/uL (ref 4.0–10.5)

## 2019-12-30 LAB — LIPID PANEL
Cholesterol: 241 mg/dL — ABNORMAL HIGH (ref 0–200)
HDL: 61.2 mg/dL (ref >39.00)
LDL Cholesterol: 141 mg/dL — ABNORMAL HIGH (ref 0–99)
NonHDL: 180.16
Total CHOL/HDL Ratio: 4
Triglycerides: 196 mg/dL — ABNORMAL HIGH (ref 0.0–149.0)
VLDL: 39.2 mg/dL (ref 0.0–40.0)

## 2020-01-05 ENCOUNTER — Ambulatory Visit
Admission: RE | Admit: 2020-01-05 | Discharge: 2020-01-05 | Disposition: A | Discharge status: Home / Self Care | Payer: Medicare Other | Source: Ambulatory Visit | Attending: Internal Medicine | Admitting: Internal Medicine

## 2020-01-05 DIAGNOSIS — Z1231 Encounter for screening mammogram for malignant neoplasm of breast: Secondary | ICD-10-CM | POA: Diagnosis not present

## 2020-01-12 ENCOUNTER — Telehealth: Payer: Self-pay

## 2020-01-12 ENCOUNTER — Other Ambulatory Visit: Payer: Self-pay

## 2020-01-12 ENCOUNTER — Ambulatory Visit (INDEPENDENT_AMBULATORY_CARE_PROVIDER_SITE_OTHER): Payer: Medicare Other | Admitting: Psychiatry

## 2020-01-12 ENCOUNTER — Encounter: Payer: Self-pay | Admitting: Psychiatry

## 2020-01-12 DIAGNOSIS — F419 Anxiety disorder, unspecified: Secondary | ICD-10-CM | POA: Diagnosis not present

## 2020-01-12 DIAGNOSIS — F3342 Major depressive disorder, recurrent, in full remission: Secondary | ICD-10-CM | POA: Insufficient documentation

## 2020-01-12 DIAGNOSIS — F410 Panic disorder [episodic paroxysmal anxiety] without agoraphobia: Secondary | ICD-10-CM

## 2020-01-12 DIAGNOSIS — G4701 Insomnia due to medical condition: Secondary | ICD-10-CM | POA: Diagnosis not present

## 2020-01-12 DIAGNOSIS — G2111 Neuroleptic induced parkinsonism: Secondary | ICD-10-CM

## 2020-01-12 DIAGNOSIS — F411 Generalized anxiety disorder: Secondary | ICD-10-CM

## 2020-01-12 DIAGNOSIS — F4010 Social phobia, unspecified: Secondary | ICD-10-CM

## 2020-01-12 DIAGNOSIS — F4310 Post-traumatic stress disorder, unspecified: Secondary | ICD-10-CM

## 2020-01-12 HISTORY — DX: Major depressive disorder, recurrent, in full remission: F33.42

## 2020-01-12 MED ORDER — ZOLPIDEM TARTRATE ER 12.5 MG PO TBCR: 12.5000 mg | EXTENDED_RELEASE_TABLET | Freq: Every evening | ORAL | 0 refills | Status: DC | PRN
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
## 2020-01-12 NOTE — Telephone Encounter (Signed)
went online to [email or web address] and submited the prior auth-  pending review.

## 2020-01-12 NOTE — Telephone Encounter (Signed)
received fax from [email or web address] that a prior auth was needed for the zolpidem

## 2020-01-13 NOTE — Telephone Encounter (Signed)
Is Dr. Shea Evans out today? If not then can you please forward this message to her only? Thanks

## 2020-01-13 NOTE — Telephone Encounter (Signed)
Can you please let her know that Zolpidem CR is not covered by her insurance and she can continue using Ambien (prescribed to her previously) as needed for sleeping difficulties. Dr. Shea Evans can touch base with her once she returns to work for alternatives.

## 2020-01-13 NOTE — Telephone Encounter (Signed)
received fax that is not covered  at all under pt plan

## 2020-01-13 NOTE — Telephone Encounter (Signed)
Doctor eappen is out today

## 2020-01-14 NOTE — Telephone Encounter (Signed)
Returned call to patient.  She reports she rather wants to stay on the Ambien CR and is willing to pay out of pocket.

## 2020-01-17 ENCOUNTER — Other Ambulatory Visit: Payer: Self-pay | Admitting: Psychiatry

## 2020-01-17 DIAGNOSIS — F4310 Post-traumatic stress disorder, unspecified: Secondary | ICD-10-CM

## 2020-01-17 DIAGNOSIS — F411 Generalized anxiety disorder: Secondary | ICD-10-CM

## 2020-01-17 DIAGNOSIS — F331 Major depressive disorder, recurrent, moderate: Secondary | ICD-10-CM

## 2020-01-17 NOTE — Telephone Encounter (Signed)
prior auth for zolpidem tartrate was denied. the drug is not on the pt plan formulary.

## 2020-01-17 NOTE — Telephone Encounter (Signed)
Patient wants to pay for Ambien CR and prefers to stay on it.

## 2020-01-20 ENCOUNTER — Encounter: Payer: Self-pay | Admitting: Internal Medicine

## 2020-01-21 ENCOUNTER — Telehealth: Payer: Self-pay | Admitting: Internal Medicine

## 2020-01-21 NOTE — Telephone Encounter (Signed)
See notes below  Call [facility] pt established still having ear pain does not want to Dr. Richardson Landry and see if she can schedule with Dr. Pryor Ochoa or Tami Ribas ASAP for ear pain   Thank you [place]  They called back from ENT office. I have to see Dr. Richardson Landry again. Can not see anyone else. I'm not goin back there to see Dr. Richardson Landry. I don't like him. Will you suggest somewhere else?   [place], [place] C "KIM"  You 7 hours ago (11:16 AM)   Good morning Dr. Olivia Mackie.   I called ENT and requested to see another doctor and was told that they didn't do that. I would have to see Dr. Richardson Landry. I told her that I wanted to see Dr. Tami Ribas or Dr. Pryor Ochoa. She said that I would have to get special approval and that could be Monday. Can you help me?   You  Common, [place] C "KIM" [place] (5:46 PM)  TM Dr. Richardson Landry 12/24/19 ENT Thought you may have cavities did you call your dentist ?    I think you should call ENT back again for urgent appt you can request another ENT I like Dr. Tami Ribas or [facility] for second opinion   TMS   Thressa Sheller, CMA routed conversation to [employer] (5:36 PM)  Rocco Serene C "[place]"  You Yesterday (1:11 PM)   Hi Dr. Olivia Mackie.   My ear is still hurting. I have had 3 antibiotics and prednisone. I used most of the steroid ear drops. It mainly hurts at night. It gets a tickling feeling in it and then it starts hurting. If I take a small piece of cotton and saturate it with the ear drops and put it in my ear I get some relief. It is strangely hurting now. Please help me. The ENT did not help.

## 2020-01-27 NOTE — Telephone Encounter (Signed)
I called [facility] . Dr. Tami Ribas reviewed the patient's chart and agreed with Dr. Reola Mosher diagnosis and that the patient should be seen by a dentist and stay with Dr. Richardson Landry. If the patient chooses to go to another ENT a referral should be submitted.

## 2020-01-28 ENCOUNTER — Telehealth: Payer: Self-pay | Admitting: Internal Medicine

## 2020-01-28 NOTE — Telephone Encounter (Signed)
We called [facility] . Dr. Tami Ribas reviewed the patient's chart and agreed with Dr. Reola Mosher diagnosis and that the patient should be seen by a dentist and stay with Dr. Richardson Landry.  Does she want to see another ENT practice in [place] or [place]?

## 2020-01-31 ENCOUNTER — Encounter: Payer: Self-pay | Admitting: Psychiatry

## 2020-01-31 ENCOUNTER — Ambulatory Visit (INDEPENDENT_AMBULATORY_CARE_PROVIDER_SITE_OTHER): Payer: Medicare Other | Admitting: Psychiatry

## 2020-01-31 ENCOUNTER — Other Ambulatory Visit: Payer: Self-pay

## 2020-01-31 DIAGNOSIS — F4010 Social phobia, unspecified: Secondary | ICD-10-CM

## 2020-01-31 DIAGNOSIS — F411 Generalized anxiety disorder: Secondary | ICD-10-CM | POA: Diagnosis not present

## 2020-01-31 DIAGNOSIS — F4310 Post-traumatic stress disorder, unspecified: Secondary | ICD-10-CM

## 2020-01-31 DIAGNOSIS — F3342 Major depressive disorder, recurrent, in full remission: Secondary | ICD-10-CM

## 2020-01-31 DIAGNOSIS — G4701 Insomnia due to medical condition: Secondary | ICD-10-CM

## 2020-01-31 DIAGNOSIS — F410 Panic disorder [episodic paroxysmal anxiety] without agoraphobia: Secondary | ICD-10-CM

## 2020-01-31 DIAGNOSIS — G2111 Neuroleptic induced parkinsonism: Secondary | ICD-10-CM

## 2020-01-31 MED ORDER — ZOLPIDEM TARTRATE ER 12.5 MG PO TBCR: 12.5000 mg | EXTENDED_RELEASE_TABLET | Freq: Every evening | ORAL | 2 refills | Status: DC | PRN

## 2020-01-31 NOTE — Telephone Encounter (Signed)
Patient informed and verbalized understanding.  She prefers a ENT in [place]
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
## 2020-02-09 NOTE — Telephone Encounter (Signed)
Referral placed   TMS 

## 2020-02-09 NOTE — Addendum Note (Signed)
Addended by: Orland Mustard on: 02/09/2020 07:43 AM   Modules accepted: Orders

## 2020-02-22 DIAGNOSIS — J3489 Other specified disorders of nose and nasal sinuses: Secondary | ICD-10-CM | POA: Diagnosis not present

## 2020-02-22 DIAGNOSIS — J321 Chronic frontal sinusitis: Secondary | ICD-10-CM | POA: Diagnosis not present

## 2020-02-22 DIAGNOSIS — H60543 Acute eczematoid otitis externa, bilateral: Secondary | ICD-10-CM

## 2020-02-22 DIAGNOSIS — H608X3 Other otitis externa, bilateral: Secondary | ICD-10-CM | POA: Diagnosis not present

## 2020-02-22 HISTORY — DX: Acute eczematoid otitis externa, bilateral: H60.543

## 2020-02-26 DIAGNOSIS — Z6841 Body Mass Index (BMI) 40.0 and over, adult: Secondary | ICD-10-CM | POA: Diagnosis not present

## 2020-02-26 DIAGNOSIS — G959 Disease of spinal cord, unspecified: Secondary | ICD-10-CM | POA: Diagnosis not present

## 2020-02-28 ENCOUNTER — Other Ambulatory Visit: Payer: Self-pay | Admitting: Orthopedic Surgery

## 2020-02-28 DIAGNOSIS — G959 Disease of spinal cord, unspecified: Secondary | ICD-10-CM

## 2020-03-10 ENCOUNTER — Other Ambulatory Visit: Payer: Self-pay | Admitting: Internal Medicine

## 2020-03-10 ENCOUNTER — Other Ambulatory Visit: Payer: Self-pay

## 2020-03-10 ENCOUNTER — Ambulatory Visit
Admission: RE | Admit: 2020-03-10 | Discharge: 2020-03-10 | Disposition: A | Discharge status: Home / Self Care | Payer: Medicare Other | Source: Ambulatory Visit | Attending: Orthopedic Surgery | Admitting: Orthopedic Surgery

## 2020-03-10 ENCOUNTER — Telehealth: Payer: Self-pay | Admitting: Internal Medicine

## 2020-03-10 DIAGNOSIS — M4802 Spinal stenosis, cervical region: Secondary | ICD-10-CM | POA: Diagnosis not present

## 2020-03-10 DIAGNOSIS — G959 Disease of spinal cord, unspecified: Secondary | ICD-10-CM

## 2020-03-10 NOTE — Telephone Encounter (Signed)
Why is she taking topamax 200 mg 2x per day  For migraines?  Ive never filled this for her  Max for migraines rec is 200 mg daily  Does she want to reduce dose?

## 2020-03-13 NOTE — Telephone Encounter (Signed)
This medication is indicated for migraines, seizures, tremor NOT for nerve pain   Does she have any of the above to indicate need for this?  She can ask Dr. Lynann Bologna about this as well but this is not indicated for nerve pain  Let me know

## 2020-03-13 NOTE — Telephone Encounter (Signed)
Pt states she is taking the 200 mg twice daily for nerve pain and not migraines.

## 2020-03-14 DIAGNOSIS — R200 Anesthesia of skin: Secondary | ICD-10-CM | POA: Diagnosis not present

## 2020-03-14 DIAGNOSIS — Z6841 Body Mass Index (BMI) 40.0 and over, adult: Secondary | ICD-10-CM | POA: Diagnosis not present

## 2020-03-15 NOTE — Telephone Encounter (Signed)
Left message to return call 

## 2020-03-23 NOTE — Telephone Encounter (Signed)
Spoke with patient and she states her neurologist took her off the Gabapentin and put her on this medication. She states that she is going to stop taking the medication because she does not like the way it made her feel.   Patient also has an appointment in office tomorrow 03/24/20.

## 2020-03-23 NOTE — Telephone Encounter (Signed)
Noted Dr. Krista Blue neurology

## 2020-03-24 ENCOUNTER — Other Ambulatory Visit: Payer: Self-pay

## 2020-03-24 ENCOUNTER — Encounter: Payer: Self-pay | Admitting: Internal Medicine

## 2020-03-24 ENCOUNTER — Ambulatory Visit (INDEPENDENT_AMBULATORY_CARE_PROVIDER_SITE_OTHER): Payer: Medicare Other

## 2020-03-24 ENCOUNTER — Ambulatory Visit (INDEPENDENT_AMBULATORY_CARE_PROVIDER_SITE_OTHER): Payer: Medicare Other | Admitting: Internal Medicine

## 2020-03-24 VITALS — BP 124/80 | HR 75 | Temp 97.3°F | Ht 65.0 in | Wt 326.2 lb

## 2020-03-24 DIAGNOSIS — Z1211 Encounter for screening for malignant neoplasm of colon: Secondary | ICD-10-CM

## 2020-03-24 DIAGNOSIS — I10 Essential (primary) hypertension: Secondary | ICD-10-CM

## 2020-03-24 DIAGNOSIS — Q613 Polycystic kidney, unspecified: Secondary | ICD-10-CM

## 2020-03-24 DIAGNOSIS — J329 Chronic sinusitis, unspecified: Secondary | ICD-10-CM

## 2020-03-24 DIAGNOSIS — M778 Other enthesopathies, not elsewhere classified: Secondary | ICD-10-CM | POA: Diagnosis not present

## 2020-03-24 DIAGNOSIS — Z1283 Encounter for screening for malignant neoplasm of skin: Secondary | ICD-10-CM | POA: Diagnosis not present

## 2020-03-24 DIAGNOSIS — N1831 Chronic kidney disease, stage 3a: Secondary | ICD-10-CM | POA: Diagnosis not present

## 2020-03-24 DIAGNOSIS — M25522 Pain in left elbow: Secondary | ICD-10-CM

## 2020-03-24 MED ORDER — FLUTICASONE PROPIONATE 50 MCG/ACT NA SUSP: 2.0000 | Freq: Every day | NASAL | 11 refills | Status: DC
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
## 2020-03-24 NOTE — Patient Instructions (Addendum)
voltaren gel  HINT water but need 55-64 ounces water day  3 referrals  [facility]  [facility]    Actinic Keratosis An actinic keratosis is a precancerous growth on the skin. If there is more than one growth, the condition is called actinic keratoses. Actinic keratoses appear most often on areas of skin that get a lot of sun exposure, including the scalp, face, ears, lips, upper back, forearms, and the backs of the hands. If left untreated, these growths may develop into a skin cancer called squamous cell carcinoma. It is important to have all these growths checked by a health care provider to determine the best treatment approach. What are the causes? Actinic keratoses are caused by getting too much ultraviolet (UV) radiation from the sun or other UV light sources. What increases the risk? You are more likely to develop this condition if you:  Have light-colored skin and blue eyes.  Have blond or red hair.  Spend a lot of time in the sun.  Do not protect your skin from the sun when outdoors.  Are an older person. The risk of developing an actinic keratosis increases with age. What are the signs or symptoms? Actinic keratoses feel like scaly, rough spots of skin. Symptoms of this condition include growths that may:  Be as small as a pinhead or as big as a quarter.  Itch, hurt, or feel sensitive.  Be skin-colored, light tan, dark tan, pink, or a combination of any of these colors. In most cases, the growths become red.  Have a small piece of pink or gray skin (skin tag) growing from them. It may be easier to notice actinic keratoses by feeling them, rather than seeing them. Sometimes, actinic keratoses disappear, but many reappear a few days to a few weeks later. How is this diagnosed? This condition is usually diagnosed with a physical exam.  A tissue sample may be removed from the actinic keratosis and examined under a microscope (biopsy). How is this treated? If needed,  this condition may be treated by:  Scraping off the actinic keratosis (curettage).  Freezing the actinic keratosis with liquid nitrogen (cryosurgery). This causes the growth to eventually fall off the skin.  Applying medicated creams or gels to destroy the cells in the growth.  Applying chemicals to the actinic keratosis to make the outer layers of skin peel off (chemical peel).  Using photodynamic therapy. In this procedure, medicated cream is applied to the actinic keratosis. This cream increases your skin's sensitivity to light. Then, a strong light is aimed at the actinic keratosis to destroy cells in the growth. Follow these instructions at home: Skin care  Apply cool, wet cloths (cool compresses) to the affected areas.  Do not scratch your skin.  Check your skin regularly for any growths, especially growths that: ? Start to itch or bleed. ? Change in size, shape, or color. Caring for the treated area  Keep the treated area clean and dry as told by your health care provider.  Do not apply any medicine, cream, or lotion to the treated area unless your health care provider tells you to do that.  Do not pick at blisters or try to break them open. This can cause infection and scarring.  If you have red or irritated skin after treatment, follow instructions from your health care provider about how to take care of the treated area. Make sure you: ? Wash your hands with soap and water before you change your bandage (dressing). If  soap and water are not available, use hand sanitizer. ? Change your dressing as told by your health care provider.  If you have red or irritated skin after treatment, check your treated area every day for signs of infection. Check for: ? Redness, swelling, or pain. ? Fluid or blood. ? Warmth. ? Pus or a bad smell. General instructions  Take or apply over-the-counter and prescription medicines only as told by your health care provider.  Return to your  normal activities as told by your health care provider. Ask your health care provider what activities are safe for you.  Have a skin exam done every year by a health care provider who is a skin specialist (dermatologist).  Keep all follow-up visits as told by your health care provider. This is important. Lifestyle  Do not use any products that contain nicotine or tobacco, such as cigarettes and e-cigarettes. If you need help quitting, ask your health care provider.  Take steps to protect your skin from the sun. ? Try to avoid the sun between 10:00 a.m. and 4:00 p.m. This is when the UV light is the strongest. ? Use a sunscreen or sunblock with SPF 30 (sun protection factor 30) or greater. ? Apply sunscreen before you are exposed to sunlight and reapply as often as directed by the instructions on the sunscreen container. ? Always wear sunglasses that have UV protection, and always wear a hat and clothing to protect your skin from sunlight. ? When possible, avoid medicines that increase your sensitivity to sunlight. ? Do not use tanning beds or other indoor tanning devices. Contact a health care provider if:  You notice any changes or new growths on your skin.  You have swelling, pain, or more redness around your treated area.  You have fluid or blood coming from your treated area.  Your treated area feels warm to the touch.  You have pus or a bad smell coming from your treated area.  You have a fever.  You have a blister that becomes large and painful. Summary  An actinic keratosis is a precancerous growth on the skin. If there is more than one growth, the condition is called actinic keratoses. In some cases, if left untreated, these growths can develop into skin cancer.  Check your skin regularly for any growths, especially growths that start to itch or bleed, or change in size, shape, or color.  Take steps to protect your skin from the sun.  Contact a health care provider if  you notice any changes or new growths on your skin.  Keep all follow-up visits as told by your health care provider. This is important. This information is not intended to replace advice given to you by your health care provider. Make sure you discuss any questions you have with your health care provider. Document Revised: 03/24/2018 Document Reviewed: 03/24/2018 Elsevier Patient Education  [place].  Tennis Elbow Tennis elbow is swelling (inflammation) in your outer forearm, near your elbow. Swelling affects the tissues that connect muscle to bone (tendons). Tennis elbow can happen in any sport or job in which you use your elbow too much. It is caused by doing the same motion over and over. Tennis elbow can cause:  Pain and tenderness in your forearm and the outer part of your elbow. You may have pain all the time, or only when using the arm.  A burning feeling. This runs from your elbow through your arm.  Weak grip in your hand. Follow  these instructions at home: Activity  Rest your elbow and wrist. Avoid activities that cause problems, as told by your doctor.  If told by your doctor, wear an elbow strap to reduce stress on the area.  Do physical therapy exercises as told.  If you lift an object, lift it with your palm facing up. This is easier on your elbow. Lifestyle  If your tennis elbow is caused by sports, check your equipment and make sure that: ? You are using it correctly. ? It fits you well.  If your tennis elbow is caused by work or by using a computer, take breaks often to stretch your arm. Talk with your manager about how you can manage your condition at work. If you have a brace:  Wear the brace as told by your doctor. Remove it only as told by your doctor.  Loosen the brace if your fingers tingle, get numb, or turn cold and blue.  Keep the brace clean.  If the brace is not waterproof, ask your doctor if you may take the brace off for bathing. If you  must keep the brace on while bathing: ? Do not let it get wet. ? Cover it with a watertight covering when you take a bath or a shower. General instructions   If told, put ice on the painful area: ? Put ice in a plastic bag. ? Place a towel between your skin and the bag. ? Leave the ice on for 20 minutes, 2-3 times a day.  Take over-the-counter and prescription medicines only as told by your doctor.  Keep all follow-up visits as told by your doctor. This is important. Contact a doctor if:  Your pain does not get better with treatment.  Your pain gets worse.  You have weakness in your forearm, hand, or fingers.  You cannot feel your forearm, hand, or fingers. Summary  Tennis elbow is swelling (inflammation) in your outer forearm, near your elbow.  Tennis elbow is caused by doing the same motion over and over.  Rest your elbow and wrist. Avoid activities that cause problems, as told by your doctor.  If told, put ice on the painful area for 20 minutes, 2-3 times a day. This information is not intended to replace advice given to you by your health care provider. Make sure you discuss any questions you have with your health care provider. Document Revised: 08/07/2018 Document Reviewed: 08/26/2017 Elsevier Patient Education  [place].  Elbow and Forearm Exercises Ask your health care provider which exercises are safe for you. Do exercises exactly as told by your health care provider and adjust them as directed. It is normal to feel mild stretching, pulling, tightness, or discomfort as you do these exercises. Stop right away if you feel sudden pain or your pain gets worse. Do not begin these exercises until told by your health care provider. Range-of-motion exercises These exercises warm up your muscles and joints and improve the movement and flexibility of your injured elbow and forearm. The exercises also help to relieve pain, numbness, and tingling. These exercises are  done using the muscles in your injured elbow and forearm (active). Elbow flexion, active 1. Hold your left / right arm at your side, and bend your elbow (flexion) as far as you can using only your arm muscles. 2. Hold this position for __________ seconds. 3. Slowly return to the starting position. Repeat __________ times. Complete this exercise __________ times a day. Elbow extension, active 1. Hold your left /  right arm at your side, and straighten your elbow (extension) as much as you can using only your arm muscles. 2. Hold this position for __________ seconds. 3. Slowly return to the starting position. Repeat __________ times. Complete this exercise __________ times a day. Active forearm rotation, supination This is an exercise in which you turn (rotate) your forearm palm up (supination). 1. Stand or sit with your elbows at your sides. 2. Bend your left / right elbow to a 90-degree angle (right angle). 3. Rotate your palm up until you feel a gentle stretch on the inside of your forearm. 4. Hold this position for __________ seconds. 5. Slowly return to the starting position. Repeat __________ times. Complete this exercise __________ times a day. Active forearm rotation, pronation This is an exercise in which you turn (rotate) your forearm palm down (pronation). 1. Stand or sit with your elbows at your sides. 2. Bend your left / right elbow to a 90-degree angle (right angle). 3. Rotate your palm down until you feel a gentle stretch on the top of your forearm. 4. Hold this position for __________ seconds. 5. Slowly return to the starting position. Repeat __________ times. Complete this exercise __________ times a day. Stretching exercises These exercises warm up your muscles and joints and improve the movement and flexibility of your injured elbow and forearm. These exercises also help to relieve pain, numbness, and tingling. These exercises are done using your healthy elbow and forearm  to help stretch the muscles in your injured elbow and forearm (active-assisted). Elbow flexion, active-assisted  1. Hold your left / right arm at your side, and bend your elbow (flexion) as much as you can using your left / right arm muscles. 2. Use your other hand to bend your left / right elbow farther. To do this, gently push up on your forearm until you feel a gentle stretch on the back of your elbow. 3. Hold this position for __________ seconds. 4. Slowly return to the starting position. Repeat __________ times. Complete this exercise __________ times a day. Elbow extension, active-assisted  1. Hold your left / right arm at your side, and straighten your elbow (extension) as much as you can using your left / right arm muscles. 2. Use your other hand to straighten the left / right elbow farther. To do this, gently push down on your forearm until you feel a gentle stretch on the inside of your elbow. 3. Hold this position for __________ seconds. 4. Slowly return to the starting position. Repeat __________ times. Complete this exercise __________ times a day. Active-assisted forearm rotation, supination This is an exercise in which you turn (rotate) your forearm palm up (supination). 1. Sit with your left / right elbow bent in a 90-degree angle (right angle) with your forearm resting on a table. 2. Keeping your upper body and shoulder still, rotate your forearm so your palm faces upward. 3. Use your other hand to help rotate your forearm further until you feel a gentle to moderate stretch. 4. Hold this position for __________ seconds. 5. Slowly release the stretch and return to the starting position. Repeat __________ times. Complete this exercise __________ times a day. Active-assisted forearm rotation, pronation This is an exercise in which you turn (rotate) your forearm palm down (pronation). 1. Sit with your left / right elbow bent in a 90-degree angle (right angle) with your forearm  resting on a table. 2. Keeping your upper body and shoulder still, rotate your forearm so your palm faces  the tabletop. 3. Use your other hand to help rotate your forearm further until you feel a gentle to moderate stretch. 4. Hold this position for __________ seconds. 5. Slowly release the stretch and return to the starting position. Repeat __________ times. Complete this exercise __________ times a day. Passive elbow flexion, supine 1. Lie on your back (supine position). 2. Extend your left / right arm up in the air, bracing it with your other hand. 3. Let your left / right hand slowly lower toward your shoulder (passive flexion), while your elbow stays pointed toward the ceiling. You should feel a gentle stretch along the back of your upper arm and elbow. 4. If instructed by your health care provider, you may increase the intensity of your stretch by adding a small wrist weight or hand weight. 5. Hold this position for __________ seconds. 6. Slowly return to the starting position. Repeat __________ times. Complete this exercise __________ times a day. Passive elbow extension, supine  1. Lie on your back (supine position). Make sure that you are in a comfortable position that lets you relax your arm muscles. 2. Place a folded towel under your left / right upper arm so your elbow and shoulder are at the same height. Straighten your left / right arm so your elbow does not rest on the bed or towel. 3. Let the weight of your hand stretch your elbow (passive extension). Keep your arm and chest muscles relaxed. You should feel a stretch on the inside of your elbow. 4. If told by your health care provider, you may increase the intensity of your stretch by adding a small wrist weight or hand weight. 5. Hold this position for __________ seconds. 6. Slowly release the stretch. Repeat __________ times. Complete this exercise __________ times a day. Strengthening exercises These exercises build  strength and endurance in your elbow and forearm. Endurance is the ability to use your muscles for a long time, even after they get tired. Elbow flexion, isometric  1. Stand or sit up straight. 2. Bend your left / right elbow in a 90-degree angle (right angle), and keep your forearm at the height of your waist. Your thumb should be pointed toward the ceiling (neutral forearm). 3. Place your other hand on top of your left / right forearm. Gently push down while you resist with your left / right arm (isometric flexion). Push as hard as you can with both arms without causing any pain or movement at your left / right elbow. 4. Hold this position for __________ seconds. 5. Slowly release the tension in both arms. Let your muscles relax completely before you repeat the exercise. Repeat __________ times. Complete this exercise __________ times a day. Elbow extension, isometric  1. Stand or sit up straight. 2. Place your left / right arm so your palm faces your abdomen and is at the height of your waist. 3. Place your other hand on the underside of your left / right forearm. Gently push up while you resist with your left / right arm (isometric extension). Push as hard as you can with both arms without causing any pain or movement at your left / right elbow. 4. Hold this position for __________ seconds. 5. Slowly release the tension in both arms. Let your muscles relax completely before you repeat the exercise. Repeat __________ times. Complete this exercise __________ times a day. Elbow flexion with forearm palm up  1. Sit on a firm chair without armrests, or stand up. 2. Place your  left / right arm at your side with your elbow straight and your palm facing forward. 3. Holding a __________weight or gripping a rubber exercise band or tubing, bend your elbow to bring your hand toward your shoulder (flexion). 4. Hold this position for __________ seconds. 5. Slowly return to the starting position. Repeat  __________ times. Complete this exercise __________ times a day. Elbow extension, active  1. Sit on a firm chair without armrests, or stand up. 2. Hold a rubber exercise band or tubing in both hands. 3. Keeping your upper arms at your sides, bring both hands up to your left / right shoulder. Keep your left / right hand just below your other hand. 4. Straighten your left / right elbow (extension) while keeping your other arm still. 5. Hold this position for __________ seconds. 6. Control the resistance of the band or tubing as you return to the starting position. Repeat __________ times. Complete this exercise __________ times a day. Forearm rotation, supination  1. Sit with your left / right forearm supported on a table. Your elbow should be at waist height and bent at a 90-degree angle (right angle). 2. Gently grasp a lightweight hammer. 3. Rest your hand over the edge of the table with your palm facing down. 4. Without moving your left / right elbow, slowly rotate your forearm to turn your palm up toward the ceiling (supination). 5. Hold this position for __________ seconds. 6. Slowly return to the starting position. Repeat __________ times. Complete this exercise __________ times a day. Forearm rotation, pronation  1. Sit with your left / right forearm supported on a table. Keep your elbow below shoulder height. 2. Gently grasp a lightweight hammer. 3. Rest your hand over the edge of the table with your palm facing up. 4. Without moving your left / right elbow, slowly rotate your forearm to turn your palm down toward the floor (pronation). 5. Hold this position for __________seconds. 6. Slowly return to the starting position. Repeat __________ times. Complete this exercise __________ times a day. This information is not intended to replace advice given to you by your health care provider. Make sure you discuss any questions you have with your health care provider. Document Revised:  03/04/2019 Document Reviewed: 12/02/2018 Elsevier Patient Education  [place].    High Cholesterol  High cholesterol is a condition in which the blood has high levels of a white, waxy, fat-like substance (cholesterol). The human body needs small amounts of cholesterol. The liver makes all the cholesterol that the body needs. Extra (excess) cholesterol comes from the food that we eat. Cholesterol is carried from the liver by the blood through the blood vessels. If you have high cholesterol, deposits (plaques) may build up on the walls of your blood vessels (arteries). Plaques make the arteries narrower and stiffer. Cholesterol plaques increase your risk for heart attack and stroke. Work with your health care provider to keep your cholesterol levels in a healthy range. What increases the risk? This condition is more likely to develop in people who:  Eat foods that are high in animal fat (saturated fat) or cholesterol.  Are overweight.  Are not getting enough exercise.  Have a family history of high cholesterol. What are the signs or symptoms? There are no symptoms of this condition. How is this diagnosed? This condition may be diagnosed from the results of a blood test.  If you are older than age 69, your health care provider may check your cholesterol every 4-6  years.  You may be checked more often if you already have high cholesterol or other risk factors for heart disease. The blood test for cholesterol measures:  "Bad" cholesterol (LDL cholesterol). This is the main type of cholesterol that causes heart disease. The desired level for LDL is less than 100.  "Good" cholesterol (HDL cholesterol). This type helps to protect against heart disease by cleaning the arteries and carrying the LDL away. The desired level for HDL is 60 or higher.  Triglycerides. These are fats that the body can store or burn for energy. The desired number for triglycerides is lower than 150.  Total  cholesterol. This is a measure of the total amount of cholesterol in your blood, including LDL cholesterol, HDL cholesterol, and triglycerides. A healthy number is less than 200. How is this treated? This condition is treated with diet changes, lifestyle changes, and medicines. Diet changes  This may include eating more whole grains, fruits, vegetables, nuts, and fish.  This may also include cutting back on red meat and foods that have a lot of added sugar. Lifestyle changes  Changes may include getting at least 40 minutes of aerobic exercise 3 times a week. Aerobic exercises include walking, biking, and swimming. Aerobic exercise along with a healthy diet can help you maintain a healthy weight.  Changes may also include quitting smoking. Medicines  Medicines are usually given if diet and lifestyle changes have failed to reduce your cholesterol to healthy levels.  Your health care provider may prescribe a statin medicine. Statin medicines have been shown to reduce cholesterol, which can reduce the risk of heart disease. Follow these instructions at home: Eating and drinking If told by your health care provider:  Eat chicken (without skin), fish, veal, shellfish, ground [place] breast, and round or loin cuts of red meat.  Do not eat fried foods or fatty meats, such as hot dogs and salami.  Eat plenty of fruits, such as apples.  Eat plenty of vegetables, such as broccoli, potatoes, and carrots.  Eat beans, peas, and lentils.  Eat grains such as barley, rice, couscous, and bulgur wheat.  Eat pasta without cream sauces.  Use skim or nonfat milk, and eat low-fat or nonfat yogurt and cheeses.  Do not eat or drink whole milk, cream, ice cream, egg yolks, or hard cheeses.  Do not eat stick margarine or tub margarines that contain trans fats (also called partially hydrogenated oils).  Do not eat saturated tropical oils, such as coconut oil and palm oil.  Do not eat cakes, cookies,  crackers, or other baked goods that contain trans fats.  General instructions  Exercise as directed by your health care provider. Increase your activity level with activities such as gardening, walking, and taking the stairs.  Take over-the-counter and prescription medicines only as told by your health care provider.  Do not use any products that contain nicotine or tobacco, such as cigarettes and e-cigarettes. If you need help quitting, ask your health care provider.  Keep all follow-up visits as told by your health care provider. This is important. Contact a health care provider if:  You are struggling to maintain a healthy diet or weight.  You need help to start on an exercise program.  You need help to stop smoking. Get help right away if:  You have chest pain.  You have trouble breathing. This information is not intended to replace advice given to you by your health care provider. Make sure you discuss any questions you  have with your health care provider. Document Revised: 11/14/2017 Document Reviewed: 05/11/2016 Elsevier Patient Education  [place].  Cholesterol Content in Foods Cholesterol is a waxy, fat-like substance that helps to carry fat in the blood. The body needs cholesterol in small amounts, but too much cholesterol can cause damage to the arteries and heart. Most people should eat less than 200 milligrams (mg) of cholesterol a day. Foods with cholesterol  Cholesterol is found in animal-based foods, such as meat, seafood, and dairy. Generally, low-fat dairy and lean meats have less cholesterol than full-fat dairy and fatty meats. The milligrams of cholesterol per serving (mg per serving) of common cholesterol-containing foods are listed below. Meat and other proteins  Egg -- one large whole egg has 186 mg.  Veal shank -- 4 oz has 141 mg.  Lean ground [place] (93% lean) -- 4 oz has 118 mg.  Fat-trimmed lamb loin -- 4 oz has 106 mg.  Lean ground beef  (90% lean) -- 4 oz has 100 mg.  Lobster -- 3.5 oz has 90 mg.  Pork loin chops -- 4 oz has 86 mg.  Canned salmon -- 3.5 oz has 83 mg.  Fat-trimmed beef top loin -- 4 oz has 78 mg.  Frankfurter -- 1 frank (3.5 oz) has 77 mg.  Crab -- 3.5 oz has 71 mg.  Roasted chicken without skin, white meat -- 4 oz has 66 mg.  Light bologna -- 2 oz has 45 mg.  Deli-cut [place] -- 2 oz has 31 mg.  Canned tuna -- 3.5 oz has 31 mg.  Berniece Salines -- 1 oz has 29 mg.  Oysters and mussels (raw) -- 3.5 oz has 25 mg.  Mackerel -- 1 oz has 22 mg.  Trout -- 1 oz has 20 mg.  Pork sausage -- 1 link (1 oz) has 17 mg.  Salmon -- 1 oz has 16 mg.  Tilapia -- 1 oz has 14 mg. Dairy  Soft-serve ice cream --  cup (4 oz) has 103 mg.  Whole-milk yogurt -- 1 cup (8 oz) has 29 mg.  Cheddar cheese -- 1 oz has 28 mg.  American cheese -- 1 oz has 28 mg.  Whole milk -- 1 cup (8 oz) has 23 mg.  2% milk -- 1 cup (8 oz) has 18 mg.  Cream cheese -- 1 tablespoon (Tbsp) has 15 mg.  Cottage cheese --  cup (4 oz) has 14 mg.  Low-fat (1%) milk -- 1 cup (8 oz) has 10 mg.  Sour cream -- 1 Tbsp has 8.5 mg.  Low-fat yogurt -- 1 cup (8 oz) has 8 mg.  Nonfat Greek yogurt -- 1 cup (8 oz) has 7 mg.  Half-and-half cream -- 1 Tbsp has 5 mg. Fats and oils  Cod liver oil -- 1 tablespoon (Tbsp) has 82 mg.  Butter -- 1 Tbsp has 15 mg.  Lard -- 1 Tbsp has 14 mg.  Bacon grease -- 1 Tbsp has 14 mg.  Mayonnaise -- 1 Tbsp has 5-10 mg.  Margarine -- 1 Tbsp has 3-10 mg. Exact amounts of cholesterol in these foods may vary depending on specific ingredients and brands. Foods without cholesterol Most plant-based foods do not have cholesterol unless you combine them with a food that has cholesterol. Foods without cholesterol include:  Grains and cereals.  Vegetables.  Fruits.  Vegetable oils, such as olive, canola, and sunflower oil.  Legumes, such as peas, beans, and lentils.  Nuts and seeds.  Egg  whites. Summary  The body needs cholesterol in small amounts, but too much cholesterol can cause damage to the arteries and heart.  Most people should eat less than 200 milligrams (mg) of cholesterol a day. This information is not intended to replace advice given to you by your health care provider. Make sure you discuss any questions you have with your health care provider. Document Revised: 10/24/2017 Document Reviewed: 07/08/2017 Elsevier Patient Education  [place].

## 2020-04-05 ENCOUNTER — Telehealth: Payer: Medicare Other | Admitting: Psychiatry

## 2020-04-09 IMAGING — US US EXTREM UP*L* LTD
1 series · 12 of 12 positions shown · non-contrast
Comparison: None.

CLINICAL DATA: Left forearm palpable mass.

EXAM:
ULTRASOUND LEFT UPPER EXTREMITY LIMITED
TECHNIQUE: Ultrasound examination of the upper extremity soft tissues was
performed in the area of clinical concern (left forearm).

[Series 1: us extrem up*left* ltd · 0.07mm/px · 12 of 12 slices shown]
[im 1/12]
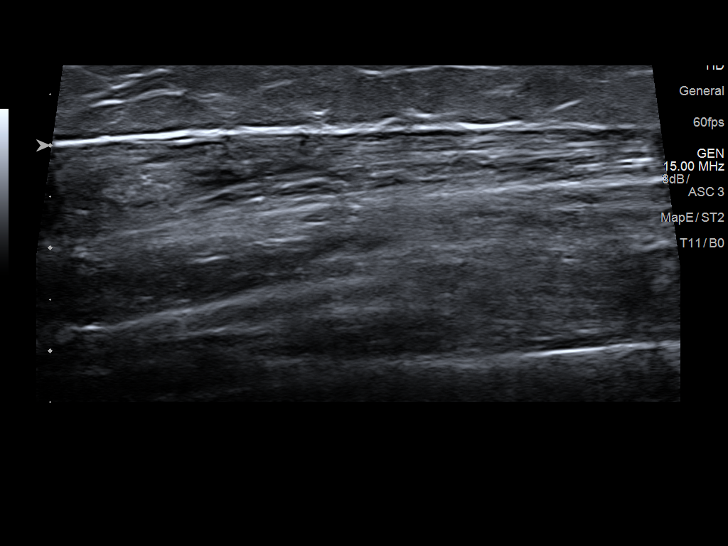
[im 2/12]
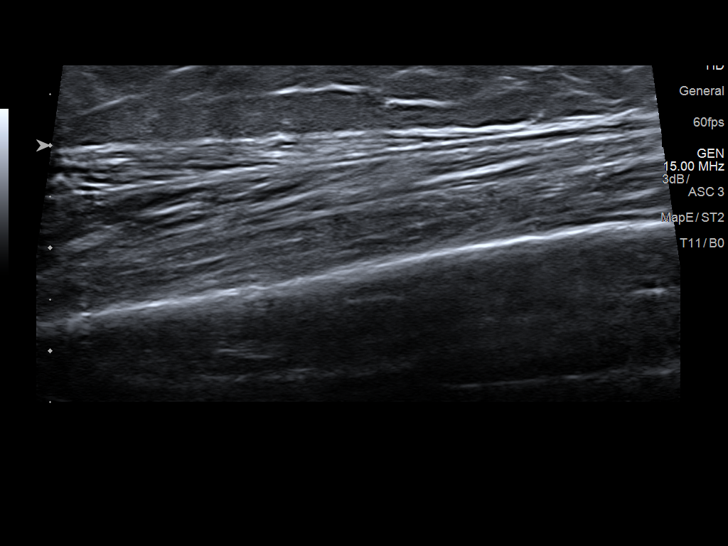
[im 3/12]
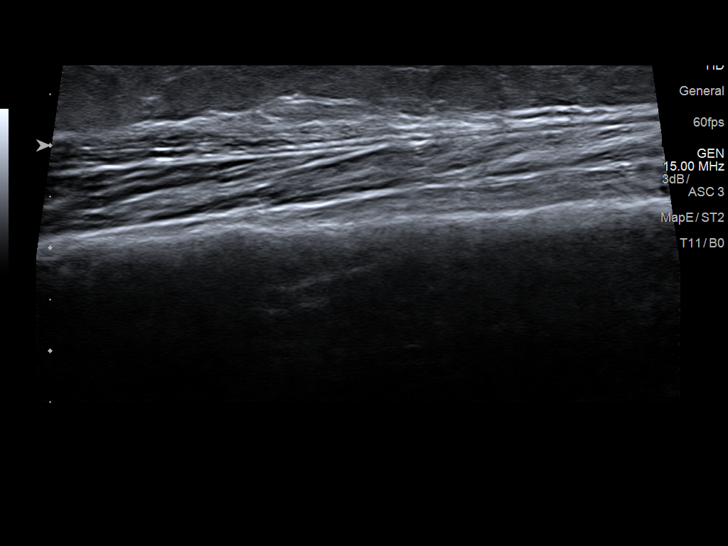
[im 4/12]
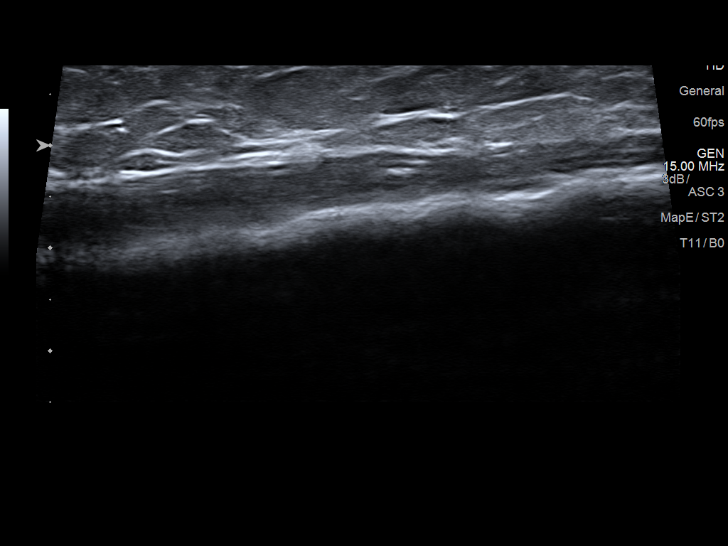
[im 5/12]
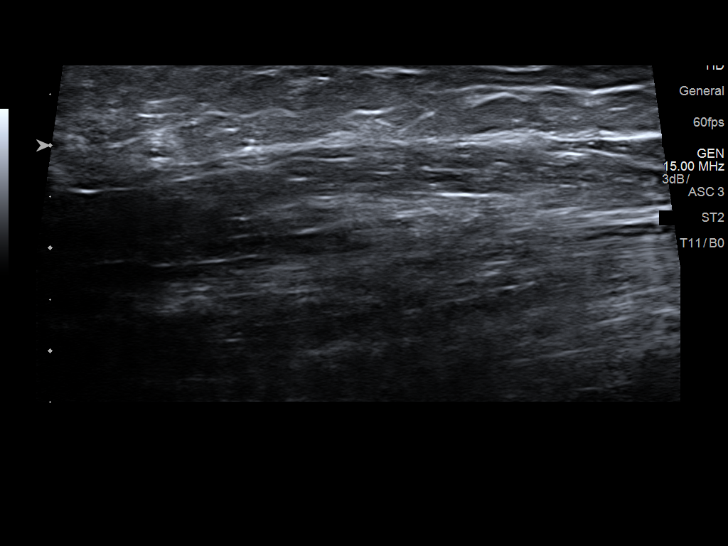
[im 6/12]
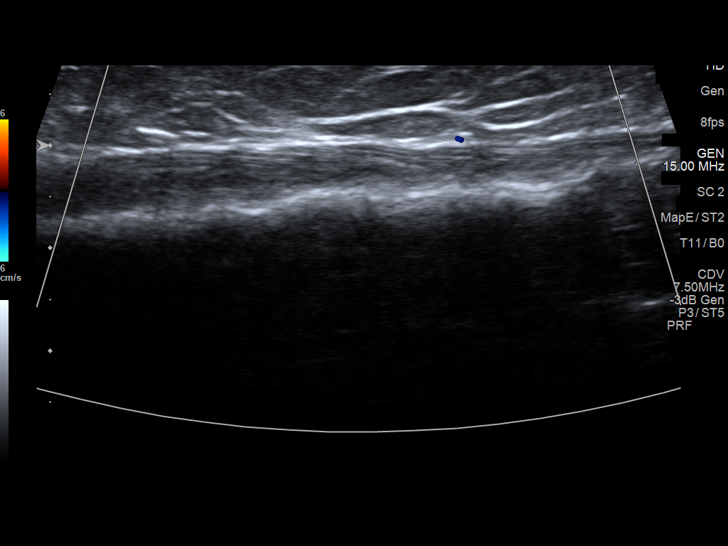
[im 7/12]
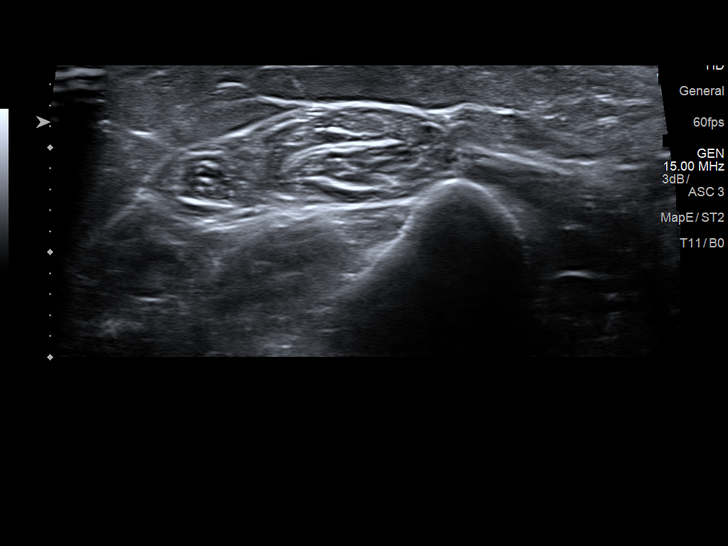
[im 8/12]
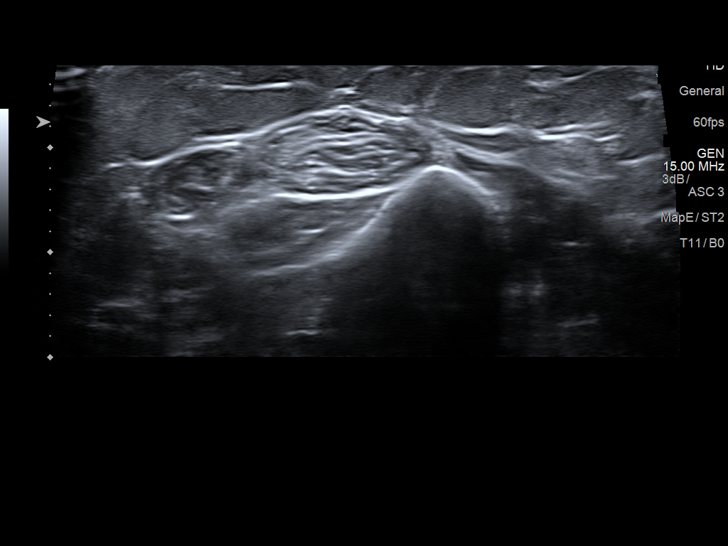
[im 9/12]
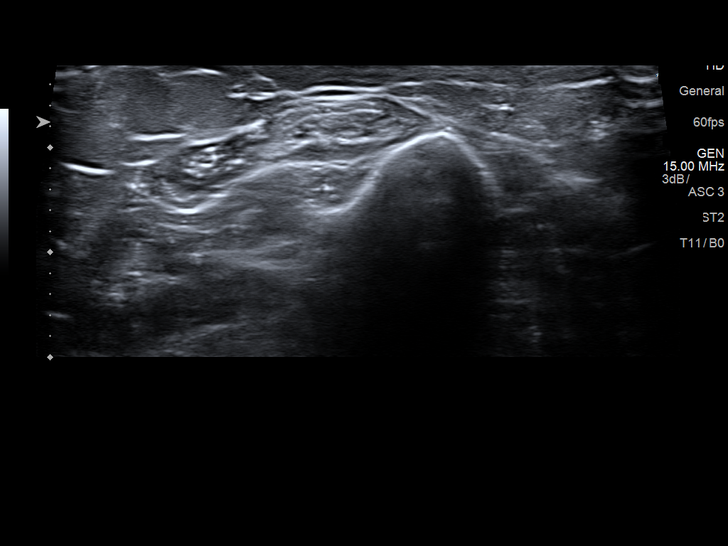
[im 10/12]
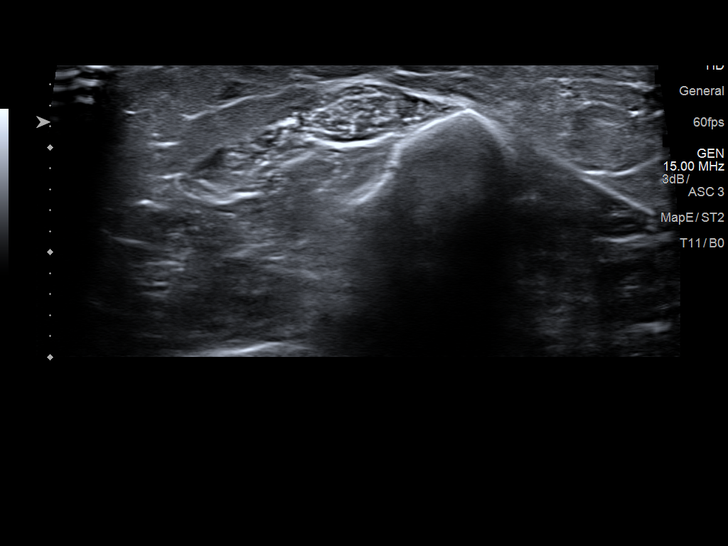
[im 11/12]
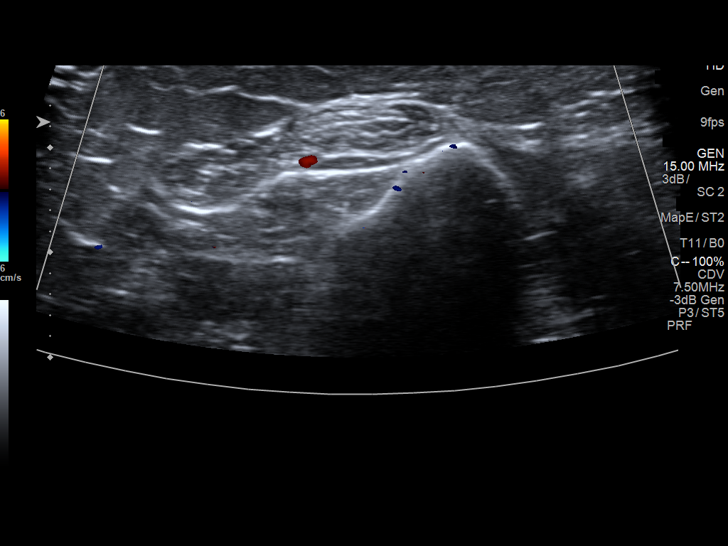
[im 12/12]
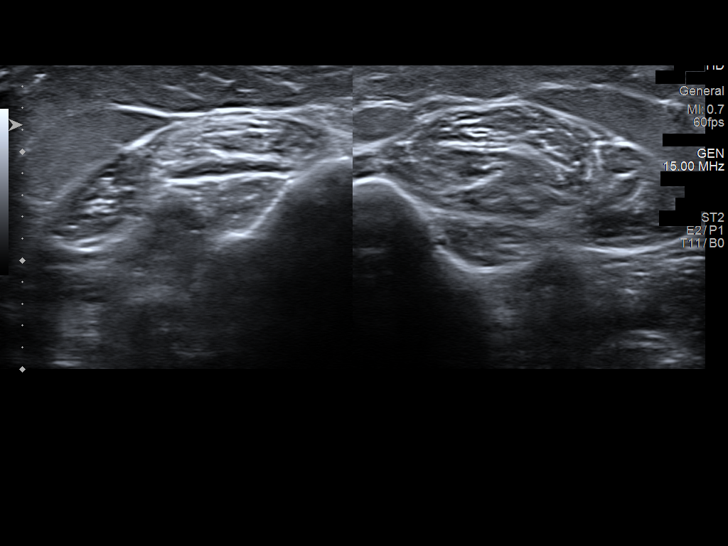

[12 of 12 positions shown; findings below may reference images not displayed]

FINDINGS: No soft tissue mass, fluid collection or abnormal vascularity is
seen in the area of the patient's palpable concern. The musculature
appears unremarkable.
IMPRESSION: No abnormality identified to correspond with the patient's palpable
concern in the left forearm. Clinical follow up of any palpable
concern recommended.

## 2020-04-11 ENCOUNTER — Other Ambulatory Visit: Payer: Self-pay | Admitting: Psychiatry

## 2020-04-11 DIAGNOSIS — F4310 Post-traumatic stress disorder, unspecified: Secondary | ICD-10-CM

## 2020-04-11 DIAGNOSIS — G2111 Neuroleptic induced parkinsonism: Secondary | ICD-10-CM

## 2020-04-11 DIAGNOSIS — F3342 Major depressive disorder, recurrent, in full remission: Secondary | ICD-10-CM

## 2020-04-11 DIAGNOSIS — F411 Generalized anxiety disorder: Secondary | ICD-10-CM

## 2020-04-17 DIAGNOSIS — E876 Hypokalemia: Secondary | ICD-10-CM | POA: Insufficient documentation

## 2020-04-17 DIAGNOSIS — R319 Hematuria, unspecified: Secondary | ICD-10-CM | POA: Insufficient documentation

## 2020-04-17 HISTORY — DX: Hypokalemia: E87.6

## 2020-04-17 HISTORY — DX: Hematuria, unspecified: R31.9

## 2020-04-21 DIAGNOSIS — N183 Chronic kidney disease, stage 3 unspecified: Secondary | ICD-10-CM | POA: Diagnosis not present

## 2020-04-21 DIAGNOSIS — R8290 Unspecified abnormal findings in urine: Secondary | ICD-10-CM | POA: Diagnosis not present

## 2020-04-26 DIAGNOSIS — N182 Chronic kidney disease, stage 2 (mild): Secondary | ICD-10-CM | POA: Diagnosis not present

## 2020-04-26 DIAGNOSIS — R319 Hematuria, unspecified: Secondary | ICD-10-CM | POA: Diagnosis not present

## 2020-04-26 DIAGNOSIS — I10 Essential (primary) hypertension: Secondary | ICD-10-CM | POA: Diagnosis not present

## 2020-04-26 DIAGNOSIS — R809 Proteinuria, unspecified: Secondary | ICD-10-CM | POA: Diagnosis not present

## 2020-05-02 ENCOUNTER — Other Ambulatory Visit: Payer: Self-pay

## 2020-05-02 ENCOUNTER — Encounter: Payer: Self-pay | Admitting: Psychiatry

## 2020-05-02 ENCOUNTER — Telehealth (INDEPENDENT_AMBULATORY_CARE_PROVIDER_SITE_OTHER): Payer: Medicare Other | Admitting: Psychiatry

## 2020-05-02 DIAGNOSIS — F4310 Post-traumatic stress disorder, unspecified: Secondary | ICD-10-CM

## 2020-05-02 DIAGNOSIS — F411 Generalized anxiety disorder: Secondary | ICD-10-CM

## 2020-05-02 DIAGNOSIS — G4701 Insomnia due to medical condition: Secondary | ICD-10-CM | POA: Diagnosis not present

## 2020-05-02 DIAGNOSIS — G2111 Neuroleptic induced parkinsonism: Secondary | ICD-10-CM | POA: Diagnosis not present

## 2020-05-02 DIAGNOSIS — F3342 Major depressive disorder, recurrent, in full remission: Secondary | ICD-10-CM | POA: Diagnosis not present

## 2020-05-02 DIAGNOSIS — F410 Panic disorder [episodic paroxysmal anxiety] without agoraphobia: Secondary | ICD-10-CM | POA: Diagnosis not present

## 2020-05-02 DIAGNOSIS — F4010 Social phobia, unspecified: Secondary | ICD-10-CM
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
## 2020-05-10 ENCOUNTER — Other Ambulatory Visit: Payer: Self-pay | Admitting: Internal Medicine

## 2020-05-10 ENCOUNTER — Other Ambulatory Visit: Payer: Self-pay | Admitting: Psychiatry

## 2020-05-10 ENCOUNTER — Telehealth: Payer: Self-pay

## 2020-05-10 DIAGNOSIS — F410 Panic disorder [episodic paroxysmal anxiety] without agoraphobia: Secondary | ICD-10-CM

## 2020-05-10 DIAGNOSIS — F419 Anxiety disorder, unspecified: Secondary | ICD-10-CM

## 2020-05-10 DIAGNOSIS — F32A Depression, unspecified: Secondary | ICD-10-CM

## 2020-05-10 DIAGNOSIS — G4701 Insomnia due to medical condition: Secondary | ICD-10-CM

## 2020-05-10 DIAGNOSIS — F4310 Post-traumatic stress disorder, unspecified: Secondary | ICD-10-CM

## 2020-05-10 MED ORDER — DULOXETINE HCL 30 MG PO CPEP: ORAL_CAPSULE | ORAL | 11 refills | Status: DC

## 2020-05-10 MED ORDER — BUSPIRONE HCL 30 MG PO TABS: 30.0000 mg | ORAL_TABLET | Freq: Two times a day (BID) | ORAL | 0 refills | Status: DC

## 2020-05-10 MED ORDER — DULOXETINE HCL 60 MG PO CPEP: ORAL_CAPSULE | ORAL | 11 refills | Status: DC

## 2020-05-10 NOTE — Telephone Encounter (Signed)
received fax requesting a refill on the buspirone hcl 30mg 

## 2020-05-17 ENCOUNTER — Other Ambulatory Visit: Payer: Self-pay | Admitting: Child and Adolescent Psychiatry

## 2020-05-17 ENCOUNTER — Other Ambulatory Visit: Payer: Self-pay

## 2020-05-17 ENCOUNTER — Ambulatory Visit: Payer: Medicare Other | Admitting: Licensed Clinical Social Worker

## 2020-05-17 DIAGNOSIS — G4701 Insomnia due to medical condition: Secondary | ICD-10-CM

## 2020-05-17 NOTE — Telephone Encounter (Signed)
I have sent Ambien to pharmacy. °

## 2020-05-17 NOTE — Telephone Encounter (Signed)
Dr. Eappen's pt

## 2020-05-25 ENCOUNTER — Telehealth: Payer: Self-pay

## 2020-05-25 NOTE — Telephone Encounter (Signed)
submitted prior auth - pending 

## 2020-05-25 NOTE — Telephone Encounter (Signed)
prior auth - denied

## 2020-05-25 NOTE — Telephone Encounter (Signed)
received notice that a prior auth was needed for zolpidem tartrate er.

## 2020-05-26 NOTE — Telephone Encounter (Signed)
Called patient discussed that PA request for Ambien was denied.  Patient reports she wants to stay on the Ambien and is currently paying out of pocket.

## 2020-06-05 ENCOUNTER — Ambulatory Visit (INDEPENDENT_AMBULATORY_CARE_PROVIDER_SITE_OTHER): Payer: Medicare Other | Admitting: Licensed Clinical Social Worker

## 2020-06-05 ENCOUNTER — Other Ambulatory Visit: Payer: Self-pay

## 2020-06-05 DIAGNOSIS — F4310 Post-traumatic stress disorder, unspecified: Secondary | ICD-10-CM | POA: Diagnosis not present

## 2020-06-05 NOTE — Progress Notes (Signed)
Virtual Visit via Video Note  I connected with Erika Hanson on 06/05/20 at 11:00 AM EDT by a video enabled telemedicine application and verified that I am speaking with the correct person using two identifiers.  Location: Patient: home  Provider: [facility]   I discussed the limitations of evaluation and management by telemedicine and the availability of in person appointments. The patient expressed understanding and agreed to proceed.   The patient was advised to call back or seek an in-person evaluation if the symptoms worsen or if the condition fails to improve as anticipated.  I provided 15 minutes of non-face-to-face time during this encounter.   Erika Heron R Myrene Bougher, LCSW   THERAPIST PROGRESS NOTE  Session Time: 11:00-11:15 am  Participation Level: Minimal  Behavioral Response: Neat and Well GroomedAlertgenerally pleasant  Type of Therapy: Individual Therapy  Treatment Goals addressed: Coping  Interventions: Supportive  Summary: [name] is a 54 y.o. female who presents with improving symptoms. Pt insisting that there are o current outstanding therapeutic issues or concerns and that she wants to just continue with psychiatric medication management.  Supported pt unconditionally and encouraged her to reach out for counseling at any time in the future.    Suicidal/Homicidal: No  Therapist Response: Erika Hanson insists that she feels all is going well in her life right now and that counseling is not needed. Discussed ways that counseling could be helpful for others and encouraged pt to reach out at any point in time. Encourged self care and continued focus on overall wellness. Sent publications through [employer] (emotional wellness).   Plan: Return again prn  Diagnosis: Axis I: Post Traumatic Stress Disorder    Axis II: No diagnosis    Erika Bo Reyaansh Merlo, LCSW 06/05/2020

## 2020-07-12 ENCOUNTER — Telehealth (INDEPENDENT_AMBULATORY_CARE_PROVIDER_SITE_OTHER): Payer: Medicare Other | Admitting: Psychiatry

## 2020-07-12 ENCOUNTER — Encounter: Payer: Self-pay | Admitting: Psychiatry

## 2020-07-12 ENCOUNTER — Other Ambulatory Visit: Payer: Self-pay

## 2020-07-12 DIAGNOSIS — F4010 Social phobia, unspecified: Secondary | ICD-10-CM | POA: Diagnosis not present

## 2020-07-12 DIAGNOSIS — F4310 Post-traumatic stress disorder, unspecified: Secondary | ICD-10-CM | POA: Diagnosis not present

## 2020-07-12 DIAGNOSIS — F411 Generalized anxiety disorder: Secondary | ICD-10-CM | POA: Diagnosis not present

## 2020-07-12 DIAGNOSIS — G2111 Neuroleptic induced parkinsonism: Secondary | ICD-10-CM

## 2020-07-12 DIAGNOSIS — G4701 Insomnia due to medical condition: Secondary | ICD-10-CM | POA: Diagnosis not present

## 2020-07-12 DIAGNOSIS — F3342 Major depressive disorder, recurrent, in full remission: Secondary | ICD-10-CM | POA: Diagnosis not present

## 2020-07-12 DIAGNOSIS — F410 Panic disorder [episodic paroxysmal anxiety] without agoraphobia: Secondary | ICD-10-CM | POA: Diagnosis not present

## 2020-07-12 MED ORDER — BUSPIRONE HCL 30 MG PO TABS: 30.0000 mg | ORAL_TABLET | Freq: Two times a day (BID) | ORAL | 0 refills | Status: DC

## 2020-07-12 MED ORDER — LAMOTRIGINE 25 MG PO TABS: ORAL_TABLET | ORAL | 0 refills | Status: DC

## 2020-07-12 MED ORDER — BREXPIPRAZOLE 0.25 MG PO TABS: 0.2500 mg | ORAL_TABLET | Freq: Every day | ORAL | 0 refills | Status: DC

## 2020-07-12 MED ORDER — BUPROPION HCL ER (SR) 100 MG PO TB12: 100.0000 mg | ORAL_TABLET | Freq: Two times a day (BID) | ORAL | 1 refills | Status: DC
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
## 2020-07-14 ENCOUNTER — Other Ambulatory Visit: Payer: Self-pay | Admitting: Internal Medicine

## 2020-07-14 DIAGNOSIS — M1990 Unspecified osteoarthritis, unspecified site: Secondary | ICD-10-CM

## 2020-07-14 MED ORDER — CELECOXIB 100 MG PO CAPS: 100.0000 mg | ORAL_CAPSULE | Freq: Two times a day (BID) | ORAL | 3 refills | Status: DC | PRN

## 2020-07-19 ENCOUNTER — Ambulatory Visit: Payer: Medicare Other | Admitting: Dermatology

## 2020-08-14 ENCOUNTER — Other Ambulatory Visit: Payer: Self-pay | Admitting: Psychiatry

## 2020-08-14 ENCOUNTER — Other Ambulatory Visit: Payer: Self-pay | Admitting: Internal Medicine

## 2020-08-14 ENCOUNTER — Telehealth: Payer: Self-pay | Admitting: Internal Medicine

## 2020-08-14 DIAGNOSIS — G8929 Other chronic pain: Secondary | ICD-10-CM

## 2020-08-14 DIAGNOSIS — F410 Panic disorder [episodic paroxysmal anxiety] without agoraphobia: Secondary | ICD-10-CM

## 2020-08-14 DIAGNOSIS — K219 Gastro-esophageal reflux disease without esophagitis: Secondary | ICD-10-CM

## 2020-08-14 DIAGNOSIS — F4310 Post-traumatic stress disorder, unspecified: Secondary | ICD-10-CM

## 2020-08-14 DIAGNOSIS — F3342 Major depressive disorder, recurrent, in full remission: Secondary | ICD-10-CM

## 2020-08-14 DIAGNOSIS — G4701 Insomnia due to medical condition: Secondary | ICD-10-CM

## 2020-08-14 MED ORDER — TRAMADOL HCL 50 MG PO TABS: ORAL_TABLET | ORAL | 2 refills | Status: DC

## 2020-08-14 MED ORDER — OMEPRAZOLE 20 MG PO CPDR: 20.0000 mg | DELAYED_RELEASE_CAPSULE | Freq: Every day | ORAL | 3 refills | Status: DC

## 2020-08-14 NOTE — Telephone Encounter (Signed)
Patient will come in to sign tomorrow morning.

## 2020-08-14 NOTE — Telephone Encounter (Signed)
Left message to return call 

## 2020-08-14 NOTE — Telephone Encounter (Signed)
Please have pt come to the office and sign pain contract for tramadol 100 mg daily prn #[id number]  Reason under problem list  Thanks [place]

## 2020-08-16 ENCOUNTER — Encounter: Payer: Self-pay | Admitting: Internal Medicine

## 2020-08-16 ENCOUNTER — Telehealth (INDEPENDENT_AMBULATORY_CARE_PROVIDER_SITE_OTHER): Payer: Medicare Other | Admitting: Internal Medicine

## 2020-08-16 ENCOUNTER — Other Ambulatory Visit: Payer: Self-pay

## 2020-08-16 VITALS — Ht 65.0 in | Wt 280.0 lb

## 2020-08-16 DIAGNOSIS — R809 Proteinuria, unspecified: Secondary | ICD-10-CM | POA: Diagnosis not present

## 2020-08-16 DIAGNOSIS — M1612 Unilateral primary osteoarthritis, left hip: Secondary | ICD-10-CM

## 2020-08-16 DIAGNOSIS — I10 Essential (primary) hypertension: Secondary | ICD-10-CM | POA: Diagnosis not present

## 2020-08-16 DIAGNOSIS — N182 Chronic kidney disease, stage 2 (mild): Secondary | ICD-10-CM

## 2020-08-16 DIAGNOSIS — Z1231 Encounter for screening mammogram for malignant neoplasm of breast: Secondary | ICD-10-CM

## 2020-08-16 DIAGNOSIS — D509 Iron deficiency anemia, unspecified: Secondary | ICD-10-CM | POA: Diagnosis not present

## 2020-08-16 DIAGNOSIS — M79652 Pain in left thigh: Secondary | ICD-10-CM

## 2020-08-16 DIAGNOSIS — Z1211 Encounter for screening for malignant neoplasm of colon: Secondary | ICD-10-CM

## 2020-08-16 DIAGNOSIS — N281 Cyst of kidney, acquired: Secondary | ICD-10-CM

## 2020-08-16 DIAGNOSIS — M25552 Pain in left hip: Secondary | ICD-10-CM | POA: Diagnosis not present

## 2020-08-16 HISTORY — DX: Chronic kidney disease, stage 2 (mild): N18.2

## 2020-08-16 HISTORY — DX: Unilateral primary osteoarthritis, left hip: M16.12

## 2020-08-16 HISTORY — DX: Proteinuria, unspecified: R80.9

## 2020-08-16 MED ORDER — HYDROCHLOROTHIAZIDE 25 MG PO TABS: 25.0000 mg | ORAL_TABLET | Freq: Every day | ORAL | 3 refills | Status: DC

## 2020-08-16 MED ORDER — LOSARTAN POTASSIUM 25 MG PO TABS: ORAL_TABLET | ORAL | 3 refills | Status: DC
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
## 2020-08-16 NOTE — Telephone Encounter (Signed)
Patient seen virtually today. Informed Dr Olivia Mackie McLean-Scocuzza she would come in to sign form

## 2020-08-17 ENCOUNTER — Telehealth (INDEPENDENT_AMBULATORY_CARE_PROVIDER_SITE_OTHER): Payer: Medicare Other | Admitting: Psychiatry

## 2020-08-17 ENCOUNTER — Encounter: Payer: Self-pay | Admitting: Psychiatry

## 2020-08-17 DIAGNOSIS — F3342 Major depressive disorder, recurrent, in full remission: Secondary | ICD-10-CM

## 2020-08-17 DIAGNOSIS — F4010 Social phobia, unspecified: Secondary | ICD-10-CM

## 2020-08-17 DIAGNOSIS — F410 Panic disorder [episodic paroxysmal anxiety] without agoraphobia: Secondary | ICD-10-CM

## 2020-08-17 DIAGNOSIS — F4310 Post-traumatic stress disorder, unspecified: Secondary | ICD-10-CM | POA: Diagnosis not present

## 2020-08-17 DIAGNOSIS — G4701 Insomnia due to medical condition: Secondary | ICD-10-CM | POA: Diagnosis not present

## 2020-08-17 DIAGNOSIS — G2111 Neuroleptic induced parkinsonism: Secondary | ICD-10-CM

## 2020-08-17 DIAGNOSIS — F411 Generalized anxiety disorder: Secondary | ICD-10-CM

## 2020-08-17 MED ORDER — LAMOTRIGINE 25 MG PO TABS: 75.0000 mg | ORAL_TABLET | Freq: Every day | ORAL | 1 refills | Status: DC
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
## 2020-08-18 ENCOUNTER — Telehealth: Payer: Self-pay | Admitting: Internal Medicine

## 2020-08-18 NOTE — Telephone Encounter (Signed)
Faxed to [facility] prescriber response form for continue current therapy. Faxed 08-18-20

## 2020-08-25 NOTE — Telephone Encounter (Signed)
Called and spoke with Patient. She will come in beginning of next week to sign contract. Placed this upfront.

## 2020-09-12 ENCOUNTER — Other Ambulatory Visit: Payer: Self-pay

## 2020-09-12 DIAGNOSIS — M545 Low back pain, unspecified: Secondary | ICD-10-CM

## 2020-09-12 DIAGNOSIS — M25559 Pain in unspecified hip: Secondary | ICD-10-CM

## 2020-09-12 DIAGNOSIS — M62838 Other muscle spasm: Secondary | ICD-10-CM

## 2020-09-12 MED ORDER — TIZANIDINE HCL 4 MG PO TABS: 4.0000 mg | ORAL_TABLET | Freq: Three times a day (TID) | ORAL | 11 refills | Status: DC | PRN

## 2020-09-13 ENCOUNTER — Encounter: Payer: Self-pay | Admitting: Internal Medicine

## 2020-09-13 ENCOUNTER — Ambulatory Visit: Admission: RE | Admit: 2020-09-13 | Payer: Medicare Other | Source: Ambulatory Visit

## 2020-09-13 ENCOUNTER — Other Ambulatory Visit: Payer: Self-pay

## 2020-09-13 ENCOUNTER — Ambulatory Visit (INDEPENDENT_AMBULATORY_CARE_PROVIDER_SITE_OTHER): Payer: Medicare Other | Admitting: Internal Medicine

## 2020-09-13 ENCOUNTER — Other Ambulatory Visit: Payer: Self-pay | Admitting: Psychiatry

## 2020-09-13 ENCOUNTER — Other Ambulatory Visit: Payer: Self-pay | Admitting: Internal Medicine

## 2020-09-13 VITALS — BP 120/72 | HR 76 | Temp 98.2°F | Ht 65.0 in | Wt 337.2 lb

## 2020-09-13 DIAGNOSIS — F4310 Post-traumatic stress disorder, unspecified: Secondary | ICD-10-CM

## 2020-09-13 DIAGNOSIS — G8929 Other chronic pain: Secondary | ICD-10-CM

## 2020-09-13 DIAGNOSIS — Z6841 Body Mass Index (BMI) 40.0 and over, adult: Secondary | ICD-10-CM

## 2020-09-13 DIAGNOSIS — K760 Fatty (change of) liver, not elsewhere classified: Secondary | ICD-10-CM

## 2020-09-13 DIAGNOSIS — M549 Dorsalgia, unspecified: Secondary | ICD-10-CM

## 2020-09-13 DIAGNOSIS — F411 Generalized anxiety disorder: Secondary | ICD-10-CM

## 2020-09-13 DIAGNOSIS — F3342 Major depressive disorder, recurrent, in full remission: Secondary | ICD-10-CM

## 2020-09-13 DIAGNOSIS — Z1329 Encounter for screening for other suspected endocrine disorder: Secondary | ICD-10-CM

## 2020-09-13 DIAGNOSIS — R1011 Right upper quadrant pain: Secondary | ICD-10-CM | POA: Diagnosis not present

## 2020-09-13 DIAGNOSIS — K581 Irritable bowel syndrome with constipation: Secondary | ICD-10-CM

## 2020-09-13 LAB — COMPREHENSIVE METABOLIC PANEL
ALT: 19 U/L (ref 0–35)
AST: 19 U/L (ref 0–37)
Albumin: 4 g/dL (ref 3.5–5.2)
Alkaline Phosphatase: 121 U/L — ABNORMAL HIGH (ref 39–117)
BUN: 19 mg/dL (ref 6–23)
CO2: 30 mEq/L (ref 19–32)
Calcium: 9.4 mg/dL (ref 8.4–10.5)
Chloride: 102 mEq/L (ref 96–112)
Creatinine, Ser: 1.22 mg/dL — ABNORMAL HIGH (ref 0.40–1.20)
GFR: 50.77 mL/min — ABNORMAL LOW (ref >60.00)
Glucose, Bld: 101 mg/dL — ABNORMAL HIGH (ref 70–99)
Potassium: 3.9 mEq/L (ref 3.5–5.1)
Sodium: 139 mEq/L (ref 135–145)
Total Bilirubin: 0.3 mg/dL (ref 0.2–1.2)
Total Protein: 6.6 g/dL (ref 6.0–8.3)

## 2020-09-13 LAB — URINALYSIS, ROUTINE W REFLEX MICROSCOPIC
Bilirubin Urine: NEGATIVE
Hgb urine dipstick: NEGATIVE
Ketones, ur: NEGATIVE
Nitrite: NEGATIVE
RBC / HPF: NONE SEEN (ref 0–?)
Specific Gravity, Urine: >=1.03 — AB (ref 1.000–1.030)
Total Protein, Urine: 30 — AB
Urine Glucose: NEGATIVE
Urobilinogen, UA: 0.2 (ref 0.0–1.0)
pH: 6 (ref 5.0–8.0)

## 2020-09-13 LAB — CBC WITH DIFFERENTIAL/PLATELET
Basophils Absolute: 0 10*3/uL (ref 0.0–0.1)
Basophils Relative: 0.8 % (ref 0.0–3.0)
Eosinophils Absolute: 0.2 10*3/uL (ref 0.0–0.7)
Eosinophils Relative: 3.4 % (ref 0.0–5.0)
HCT: 45.2 % (ref 36.0–46.0)
Hemoglobin: 15.2 g/dL — ABNORMAL HIGH (ref 12.0–15.0)
Lymphocytes Relative: 28.1 % (ref 12.0–46.0)
Lymphs Abs: 1.6 10*3/uL (ref 0.7–4.0)
MCHC: 33.7 g/dL (ref 30.0–36.0)
MCV: 87.8 fl (ref 78.0–100.0)
Monocytes Absolute: 0.5 10*3/uL (ref 0.1–1.0)
Monocytes Relative: 8.9 % (ref 3.0–12.0)
Neutro Abs: 3.3 10*3/uL (ref 1.4–7.7)
Neutrophils Relative %: 58.8 % (ref 43.0–77.0)
Platelets: 268 10*3/uL (ref 150.0–400.0)
RBC: 5.15 Mil/uL — ABNORMAL HIGH (ref 3.87–5.11)
RDW: 14.3 % (ref 11.5–15.5)
WBC: 5.6 10*3/uL (ref 4.0–10.5)

## 2020-09-13 LAB — TSH: TSH: 2.03 u[IU]/mL (ref 0.35–4.50)

## 2020-09-13 MED ORDER — TRAMADOL HCL 50 MG PO TABS: ORAL_TABLET | ORAL | 2 refills | Status: DC

## 2020-09-13 MED ORDER — LINACLOTIDE 290 MCG PO CAPS: 290.0000 ug | ORAL_CAPSULE | Freq: Every day | ORAL | 3 refills | Status: DC

## 2020-09-13 MED ORDER — PHENTERMINE HCL 37.5 MG PO TABS: 37.5000 mg | ORAL_TABLET | Freq: Every day | ORAL | 0 refills | Status: DC
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
## 2020-09-13 NOTE — Patient Instructions (Addendum)
Tylenol up to 4000 mg total/24 hours   Thoracic Strain Rehab Ask your health care provider which exercises are safe for you. Do exercises exactly as told by your health care provider and adjust them as directed. It is normal to feel mild stretching, pulling, tightness, or discomfort as you do these exercises. Stop right away if you feel sudden pain or your pain gets worse. Do not begin these exercises until told by your health care provider. Stretching and range-of-motion exercise This exercise warms up your muscles and joints and improves the movement and flexibility of your back and shoulders. This exercise also helps to relieve pain. Chest and spine stretch  1. Lie down on your back on a firm surface. 2. Roll a towel or a small blanket so it is about 4 inches (10 cm) in diameter. 3. Put the towel lengthwise under the middle of your back so it is under your spine, but not under your shoulder blades. 4. Put your hands behind your head and let your elbows fall to your sides. This will increase your stretch. 5. Take a deep breath (inhale). 6. Hold for __________ seconds. 7. Relax after you breathe out (exhale). Repeat __________ times. Complete this exercise __________ times a day. Strengthening exercises These exercises build strength and endurance in your back and your shoulder blade muscles. Endurance is the ability to use your muscles for a long time, even after they get tired. Alternating arm and leg raises  1. Get on your hands and knees on a firm surface. If you are on a hard floor, you may want to use padding, such as an exercise mat, to cushion your knees. 2. Line up your arms and legs. Your hands should be directly below your shoulders, and your knees should be directly below your hips. 3. Lift your left leg behind you. At the same time, raise your right arm and straighten it in front of you. ? Do not lift your leg higher than your hip. ? Do not lift your arm higher than your  shoulder. ? Keep your abdominal and back muscles tight. ? Keep your hips facing the ground. ? Do not arch your back. ? Keep your balance carefully, and do not hold your breath. 4. Hold for __________ seconds. 5. Slowly return to the starting position and repeat with your right leg and your left arm. Repeat __________ times. Complete this exercise __________ times a day. Straight arm rows This exercise is also called shoulder extension exercise. 1. Stand with your feet shoulder width apart. 2. Secure an exercise band to a stable object in front of you so the band is at or above shoulder height. 3. Hold one end of the exercise band in each hand. 4. Straighten your elbows and lift your hands up to shoulder height. 5. Step back, away from the secured end of the exercise band, until the band stretches. 6. Squeeze your shoulder blades together and pull your hands down to the sides of your thighs. Stop when your hands are straight down by your sides. This is shoulder extension. Do not let your hands go behind your body. 7. Hold for __________ seconds. 8. Slowly return to the starting position. Repeat __________ times. Complete this exercise __________ times a day. Prone shoulder external rotation 1. Lie on your abdomen on a firm bed so your left / right forearm hangs over the edge of the bed and your upper arm is on the bed, straight out from your body. This is the  prone position. ? Your elbow should be bent. ? Your palm should be facing your feet. 2. If instructed, hold a __________ weight in your hand. 3. Squeeze your shoulder blade toward the middle of your back. Do not let your shoulder lift toward your ear. 4. Keep your elbow bent in a 90-degree angle (right angle) while you slowly move your forearm up toward the ceiling. Move your forearm up to the height of the bed, toward your head. This is external rotation. ? Your upper arm should not move. ? At the top of the movement, your palm should  face the floor. 5. Hold for __________ seconds. 6. Slowly return to the starting position and relax your muscles. Repeat __________ times. Complete this exercise __________ times a day. Rowing scapular retraction This is an exercise in which the shoulder blades (scapulae) are pulled toward each other (retraction). 1. Sit in a stable chair without armrests, or stand up. 2. Secure an exercise band to a stable object in front of you so the band is at shoulder height. 3. Hold one end of the exercise band in each hand. Your palms should face down. 4. Bring your arms out straight in front of you. 5. Step back, away from the secured end of the exercise band, until the band stretches. 6. Pull the band backward. As you do this, bend your elbows and squeeze your shoulder blades together, but avoid letting the rest of your body move. Do not shrug your shoulders upward while you do this. 7. Stop when your elbows are at your sides or slightly behind your body. 8. Hold for __________ seconds. 9. Slowly straighten your arms to return to the starting position. Repeat __________ times. Complete this exercise __________ times a day. Posture and body mechanics Good posture and healthy body mechanics can help to relieve stress in your body's tissues and joints. Body mechanics refers to the movements and positions of your body while you do your daily activities. Posture is part of body mechanics. Good posture means:  Your spine is in its natural S-curve position (neutral).  Your shoulders are pulled back slightly.  Your head is not tipped forward. Follow these guidelines to improve your posture and body mechanics in your everyday activities. Standing   When standing, keep your spine neutral and your feet about hip width apart. Keep a slight bend in your knees. Your ears, shoulders, and hips should line up with each other.  When you do a task in which you lean forward while standing in one place for a long  time, place one foot up on a stable object that is 2-4 inches (5-10 cm) high, such as a footstool. This helps keep your spine neutral. Sitting   When sitting, keep your spine neutral and keep your feet flat on the floor. Use a footrest, if necessary, and keep your thighs parallel to the floor. Avoid rounding your shoulders, and avoid tilting your head forward.  When working at a desk or a computer, keep your desk at a height where your hands are slightly lower than your elbows. Slide your chair under your desk so you are close enough to maintain good posture.  When working at a computer, place your monitor at a height where you are looking straight ahead and you do not have to tilt your head forward or downward to look at the screen. Resting When lying down and resting, avoid positions that are most painful for you.  If you have pain with activities  such as sitting, bending, stooping, or squatting (flexion-basedactivities), lie in a position in which your body does not bend very much. For example, avoid curling up on your side with your arms and knees near your chest (fetal position).  If you have pain with activities such as standing for a long time or reaching with your arms (extension-basedactivities), lie with your spine in a neutral position and bend your knees slightly. Try the following positions: ? Lie on your side with a pillow between your knees. ? Lie on your back with a pillow under your knees.  Lifting   When lifting objects, keep your feet at least shoulder width apart and tighten your abdominal muscles.  Bend your knees and hips and keep your spine neutral. It is important to lift using the strength of your legs, not your back. Do not lock your knees straight out.  Always ask for help to lift heavy or awkward objects. This information is not intended to replace advice given to you by your health care provider. Make sure you discuss any questions you have with your health care  provider. Document Revised: 03/05/2019 Document Reviewed: 12/21/2018 Elsevier Patient Education  [place].  Cholelithiasis  Cholelithiasis is a form of gallbladder disease in which gallstones form in the gallbladder. The gallbladder is an organ that stores bile. Bile is made in the liver, and it helps to digest fats. Gallstones begin as small crystals and slowly grow into stones. They may cause no symptoms until the gallbladder tightens (contracts) and a gallstone is blocking the duct (gallbladder attack), which can cause pain. Cholelithiasis is also referred to as gallstones. There are two main types of gallstones:  Cholesterol stones. These are made of hardened cholesterol and are usually yellow-green in color. They are the most common type of gallstone. Cholesterol is a white, waxy, fat-like substance that is made in the liver.  Pigment stones. These are dark in color and are made of a red-yellow substance that forms when hemoglobin from red blood cells breaks down (bilirubin). What are the causes? This condition may be caused by an imbalance in the substances that bile is made of. This can happen if the bile:  Has too much bilirubin.  Has too much cholesterol.  Does not have enough bile salts. These salts help the body absorb and digest fats. In some cases, this condition can also be caused by the gallbladder not emptying completely or often enough. What increases the risk? The following factors may make you more likely to develop this condition:  Being female.  Having multiple pregnancies. Health care providers sometimes advise removing diseased gallbladders before future pregnancies.  Eating a diet that is heavy in fried foods, fat, and refined carbohydrates, like white bread and white rice.  Being obese.  Being older than age 67.  Prolonged use of medicines that contain female hormones (estrogen).  Having diabetes mellitus.  Rapidly losing weight.  Having a  family history of gallstones.  Being of [place] or [place] descent.  Having an intestinal disease such as Crohn disease.  Having metabolic syndrome.  Having cirrhosis.  Having severe types of anemia such as sickle cell anemia. What are the signs or symptoms? In most cases, there are no symptoms. These are known as silent gallstones. If a gallstone blocks the bile ducts, it can cause a gallbladder attack. The main symptom of a gallbladder attack is sudden pain in the upper right abdomen. The pain usually comes at night or after eating  a large meal. The pain can last for one or several hours and can spread to the right shoulder or chest. If the bile duct is blocked for more than a few hours, it can cause infection or inflammation of the gallbladder, liver, or pancreas, which may cause:  Nausea.  Vomiting.  Abdominal pain that lasts for 5 hours or more.  Fever or chills.  Yellowing of the skin or the whites of the eyes (jaundice).  Dark urine.  Light-colored stools. How is this diagnosed? This condition may be diagnosed based on:  A physical exam.  Your medical history.  An ultrasound of your gallbladder.  CT scan.  MRI.  Blood tests to check for signs of infection or inflammation.  A scan of your gallbladder and bile ducts (biliary system) using nonharmful radioactive material and special cameras that can see the radioactive material (cholescintigram). This test checks to see how your gallbladder contracts and whether bile ducts are blocked.  Inserting a small tube with a camera on the end (endoscope) through your mouth to inspect bile ducts and check for blockages (endoscopic retrograde cholangiopancreatogram). How is this treated? Treatment for gallstones depends on the severity of the condition. Silent gallstones do not need treatment. If the gallstones cause a gallbladder attack or other symptoms, treatment may be required. Options for treatment  include:  Surgery to remove the gallbladder (cholecystectomy). This is the most common treatment.  Medicines to dissolve gallstones. These are most effective at treating small gallstones. You may need to take medicines for up to 6-12 months.  Shock wave treatment (extracorporeal biliary lithotripsy). In this treatment, an ultrasound machine sends shock waves to the gallbladder to break gallstones into smaller pieces. These pieces can then be passed into the intestines or be dissolved by medicine. This is rarely used.  Removing gallstones through endoscopic retrograde cholangiopancreatogram. A small basket can be attached to the endoscope and used to capture and remove gallstones. Follow these instructions at home:  Take over-the-counter and prescription medicines only as told by your health care provider.  Maintain a healthy weight and follow a healthy diet. This includes: ? Reducing fatty foods, such as fried food. ? Reducing refined carbohydrates, like white bread and white rice. ? Increasing fiber. Aim for foods like almonds, fruit, and beans.  Keep all follow-up visits as told by your health care provider. This is important. Contact a health care provider if:  You think you have had a gallbladder attack.  You have been diagnosed with silent gallstones and you develop abdominal pain or indigestion. Get help right away if:  You have pain from a gallbladder attack that lasts for more than 2 hours.  You have abdominal pain that lasts for more than 5 hours.  You have a fever or chills.  You have persistent nausea and vomiting.  You develop jaundice.  You have dark urine or light-colored stools. Summary  Cholelithiasis (also called gallstones) is a form of gallbladder disease in which gallstones form in the gallbladder.  This condition is caused by an imbalance in the substances that make up bile. This can happen if the bile has too much cholesterol, too much bilirubin, or not  enough bile salts.  You are more likely to develop this condition if you are female, pregnant, using medicines with estrogen, obese, older than age 7, or have a family history of gallstones. You may also develop gallstones if you have diabetes, an intestinal disease, cirrhosis, or metabolic syndrome.  Treatment for gallstones depends  on the severity of the condition. Silent gallstones do not need treatment.  If gallstones cause a gallbladder attack or other symptoms, treatment may be needed. The most common treatment is surgery to remove the gallbladder. This information is not intended to replace advice given to you by your health care provider. Make sure you discuss any questions you have with your health care provider. Document Revised: 10/24/2017 Document Reviewed: 07/28/2016 Elsevier Patient Education  2020 [employer].

## 2020-09-14 ENCOUNTER — Telehealth: Payer: Self-pay | Admitting: Internal Medicine

## 2020-09-14 ENCOUNTER — Other Ambulatory Visit: Payer: Self-pay | Admitting: Internal Medicine

## 2020-09-14 ENCOUNTER — Other Ambulatory Visit: Payer: Self-pay

## 2020-09-14 ENCOUNTER — Encounter: Payer: Self-pay | Admitting: Internal Medicine

## 2020-09-14 ENCOUNTER — Ambulatory Visit
Admission: RE | Admit: 2020-09-14 | Discharge: 2020-09-14 | Disposition: A | Discharge status: Home / Self Care | Payer: Medicare Other | Source: Ambulatory Visit | Attending: Internal Medicine | Admitting: Internal Medicine

## 2020-09-14 DIAGNOSIS — R109 Unspecified abdominal pain: Secondary | ICD-10-CM | POA: Diagnosis not present

## 2020-09-14 DIAGNOSIS — K760 Fatty (change of) liver, not elsewhere classified: Secondary | ICD-10-CM | POA: Insufficient documentation

## 2020-09-14 DIAGNOSIS — R1011 Right upper quadrant pain: Secondary | ICD-10-CM | POA: Insufficient documentation

## 2020-09-14 DIAGNOSIS — N3000 Acute cystitis without hematuria: Secondary | ICD-10-CM

## 2020-09-14 NOTE — Telephone Encounter (Signed)
Received call report that abdominal ultrasound is now available in epic

## 2020-09-14 NOTE — Progress Notes (Signed)
uti

## 2020-09-15 ENCOUNTER — Other Ambulatory Visit: Payer: Self-pay | Admitting: Internal Medicine

## 2020-09-15 ENCOUNTER — Encounter: Payer: Self-pay | Admitting: Internal Medicine

## 2020-09-15 DIAGNOSIS — M549 Dorsalgia, unspecified: Secondary | ICD-10-CM

## 2020-09-15 NOTE — Telephone Encounter (Signed)
McLean-Scocuzza, [name], MD to [facility] "[facility]"   TM  8:14 AM Sometimes back pain can radiate around to abdomen  Do you want me to order Xray of mid back today?

## 2020-09-18 ENCOUNTER — Other Ambulatory Visit: Payer: Self-pay | Admitting: Internal Medicine

## 2020-09-18 DIAGNOSIS — D751 Secondary polycythemia: Secondary | ICD-10-CM

## 2020-09-20 ENCOUNTER — Other Ambulatory Visit: Payer: Medicare Other

## 2020-09-27 ENCOUNTER — Telehealth (INDEPENDENT_AMBULATORY_CARE_PROVIDER_SITE_OTHER): Payer: Medicare Other | Admitting: Psychiatry

## 2020-09-27 ENCOUNTER — Encounter: Payer: Self-pay | Admitting: Psychiatry

## 2020-09-27 ENCOUNTER — Other Ambulatory Visit: Payer: Self-pay

## 2020-09-27 DIAGNOSIS — F3342 Major depressive disorder, recurrent, in full remission: Secondary | ICD-10-CM | POA: Diagnosis not present

## 2020-09-27 DIAGNOSIS — G4701 Insomnia due to medical condition: Secondary | ICD-10-CM

## 2020-09-27 DIAGNOSIS — F4010 Social phobia, unspecified: Secondary | ICD-10-CM

## 2020-09-27 DIAGNOSIS — F410 Panic disorder [episodic paroxysmal anxiety] without agoraphobia: Secondary | ICD-10-CM

## 2020-09-27 DIAGNOSIS — F4310 Post-traumatic stress disorder, unspecified: Secondary | ICD-10-CM

## 2020-09-27 DIAGNOSIS — F411 Generalized anxiety disorder: Secondary | ICD-10-CM

## 2020-09-27 MED ORDER — BUSPIRONE HCL 30 MG PO TABS: 30.0000 mg | ORAL_TABLET | Freq: Every day | ORAL | 0 refills | Status: DC
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
## 2020-09-28 ENCOUNTER — Encounter: Payer: Self-pay | Admitting: Internal Medicine

## 2020-10-02 ENCOUNTER — Encounter: Payer: Self-pay | Admitting: Internal Medicine

## 2020-10-11 DIAGNOSIS — J3489 Other specified disorders of nose and nasal sinuses: Secondary | ICD-10-CM | POA: Diagnosis not present

## 2020-10-11 DIAGNOSIS — R438 Other disturbances of smell and taste: Secondary | ICD-10-CM | POA: Diagnosis not present

## 2020-10-11 DIAGNOSIS — J342 Deviated nasal septum: Secondary | ICD-10-CM | POA: Diagnosis not present

## 2020-10-25 ENCOUNTER — Telehealth (INDEPENDENT_AMBULATORY_CARE_PROVIDER_SITE_OTHER): Payer: Medicare Other | Admitting: Psychiatry

## 2020-10-25 ENCOUNTER — Encounter: Payer: Self-pay | Admitting: Psychiatry

## 2020-10-25 ENCOUNTER — Other Ambulatory Visit: Payer: Self-pay

## 2020-10-25 DIAGNOSIS — F4310 Post-traumatic stress disorder, unspecified: Secondary | ICD-10-CM

## 2020-10-25 DIAGNOSIS — F4010 Social phobia, unspecified: Secondary | ICD-10-CM | POA: Diagnosis not present

## 2020-10-25 DIAGNOSIS — F411 Generalized anxiety disorder: Secondary | ICD-10-CM

## 2020-10-25 DIAGNOSIS — G4701 Insomnia due to medical condition: Secondary | ICD-10-CM | POA: Diagnosis not present

## 2020-10-25 DIAGNOSIS — F410 Panic disorder [episodic paroxysmal anxiety] without agoraphobia: Secondary | ICD-10-CM

## 2020-10-25 DIAGNOSIS — F3342 Major depressive disorder, recurrent, in full remission: Secondary | ICD-10-CM | POA: Diagnosis not present

## 2020-10-25 MED ORDER — ZOLPIDEM TARTRATE ER 12.5 MG PO TBCR: 12.5000 mg | EXTENDED_RELEASE_TABLET | Freq: Every evening | ORAL | 2 refills | Status: DC | PRN

## 2020-10-25 MED ORDER — LAMOTRIGINE 25 MG PO TABS: 25.0000 mg | ORAL_TABLET | Freq: Two times a day (BID) | ORAL | 1 refills | Status: DC
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
## 2020-11-01 DIAGNOSIS — M2241 Chondromalacia patellae, right knee: Secondary | ICD-10-CM | POA: Diagnosis not present

## 2020-11-15 ENCOUNTER — Telehealth: Payer: Self-pay

## 2020-11-15 NOTE — Telephone Encounter (Signed)
Faxed singed prescriber response form for GERD to [facility] @ [phone] on 11/15/20

## 2020-11-23 DIAGNOSIS — M25561 Pain in right knee: Secondary | ICD-10-CM | POA: Diagnosis not present

## 2020-11-29 ENCOUNTER — Other Ambulatory Visit: Payer: Self-pay | Admitting: Orthopedic Surgery

## 2020-11-29 DIAGNOSIS — S83281A Other tear of lateral meniscus, current injury, right knee, initial encounter: Secondary | ICD-10-CM

## 2020-11-30 ENCOUNTER — Other Ambulatory Visit: Payer: Self-pay | Admitting: Orthopedic Surgery

## 2020-11-30 ENCOUNTER — Other Ambulatory Visit (HOSPITAL_COMMUNITY): Payer: Self-pay | Admitting: Orthopedic Surgery

## 2020-11-30 DIAGNOSIS — S83281A Other tear of lateral meniscus, current injury, right knee, initial encounter: Secondary | ICD-10-CM

## 2020-12-04 ENCOUNTER — Other Ambulatory Visit: Payer: Medicare Other

## 2020-12-04 DIAGNOSIS — Z20822 Contact with and (suspected) exposure to covid-19: Secondary | ICD-10-CM | POA: Diagnosis not present

## 2020-12-05 LAB — SPECIMEN STATUS REPORT

## 2020-12-05 LAB — NOVEL CORONAVIRUS, NAA: SARS-CoV-2, NAA: DETECTED — AB

## 2020-12-05 LAB — SARS-COV-2, NAA 2 DAY TAT

## 2020-12-08 ENCOUNTER — Encounter (HOSPITAL_COMMUNITY): Payer: Self-pay

## 2020-12-08 ENCOUNTER — Ambulatory Visit: Payer: Medicare Other

## 2020-12-08 ENCOUNTER — Ambulatory Visit (HOSPITAL_COMMUNITY): Admission: RE | Admit: 2020-12-08 | Payer: Medicare Other | Source: Ambulatory Visit

## 2020-12-08 ENCOUNTER — Other Ambulatory Visit: Payer: Self-pay | Admitting: Orthopedic Surgery

## 2020-12-08 DIAGNOSIS — S83281A Other tear of lateral meniscus, current injury, right knee, initial encounter: Secondary | ICD-10-CM

## 2020-12-11 ENCOUNTER — Ambulatory Visit (INDEPENDENT_AMBULATORY_CARE_PROVIDER_SITE_OTHER): Payer: Medicare Other

## 2020-12-11 VITALS — Ht 65.0 in | Wt 297.0 lb

## 2020-12-11 DIAGNOSIS — Z0000 Encounter for general adult medical examination without abnormal findings: Secondary | ICD-10-CM | POA: Diagnosis not present

## 2020-12-11 NOTE — Patient Instructions (Addendum)
Erika Hanson , Thank you for taking time to come for your Medicare Wellness Visit. I appreciate your ongoing commitment to your health goals. Please review the following plan we discussed and let me know if I can assist you in the future.   These are the goals we discussed: Goals      Patient Stated   .  Increase physical activity (pt-stated)      Leg strengthening exercises       This is a list of the screening recommended for you and due dates:  Health Maintenance  Topic Date Due  . Colon Cancer Screening  Never done  . Flu Shot  02/22/2021*  . Mammogram  01/04/2021  . COVID-19 Vaccine (3 - Booster for Moderna series) 01/22/2021  . Pap Smear  08/26/2021  . Tetanus Vaccine  06/18/2028  .  Hepatitis C: One time screening is recommended by Center for Disease Control  (CDC) for  adults born from [birth year] through 1963.   Completed  . HIV Screening  Completed  *Topic was postponed. The date shown is not the original due date.    Immunizations Immunization History  Administered Date(s) Administered  . Moderna Sars-Covid-2 Vaccination 06/25/2020, 07/23/2020  . Tdap 06/18/2018   Keep all routine maintenance appointments.   Next follow up and scheduled lab 01/17/21 @ 11:00  Follow up 02/14/21 @ 8:30  Advanced directives: not yet completed  Conditions/risks identified: none new  Follow up in one year for your annual wellness visit.   Preventive Care 40-64 Years, Female Preventive care refers to lifestyle choices and visits with your health care provider that can promote health and wellness. What does preventive care include?  A yearly physical exam. This is also called an annual well check.  Dental exams once or twice a year.  Routine eye exams. Ask your health care provider how often you should have your eyes checked.  Personal lifestyle choices, including:  Daily care of your teeth and gums.  Regular physical activity.  Eating a healthy diet.  Avoiding tobacco and drug  use.  Limiting alcohol use.  Practicing safe sex.  Taking low-dose aspirin daily starting at age 41.  Taking vitamin and mineral supplements as recommended by your health care provider. What happens during an annual well check? The services and screenings done by your health care provider during your annual well check will depend on your age, overall health, lifestyle risk factors, and family history of disease. Counseling  Your health care provider may ask you questions about your:  Alcohol use.  Tobacco use.  Drug use.  Emotional well-being.  Home and relationship well-being.  Sexual activity.  Eating habits.  Work and work Statistician.  Method of birth control.  Menstrual cycle.  Pregnancy history. Screening  You may have the following tests or measurements:  Height, weight, and BMI.  Blood pressure.  Lipid and cholesterol levels. These may be checked every 5 years, or more frequently if you are over 76 years old.  Skin check.  Lung cancer screening. You may have this screening every year starting at age 74 if you have a 30-pack-year history of smoking and currently smoke or have quit within the past 15 years.  Fecal occult blood test (FOBT) of the stool. You may have this test every year starting at age 14.  Flexible sigmoidoscopy or colonoscopy. You may have a sigmoidoscopy every 5 years or a colonoscopy every 10 years starting at age 32.  Hepatitis C blood test.  Hepatitis  B blood test.  Sexually transmitted disease (STD) testing.  Diabetes screening. This is done by checking your blood sugar (glucose) after you have not eaten for a while (fasting). You may have this done every 1-3 years.  Mammogram. This may be done every 1-2 years. Talk to your health care provider about when you should start having regular mammograms. This may depend on whether you have a family history of breast cancer.  BRCA-related cancer screening. This may be done if you  have a family history of breast, ovarian, tubal, or peritoneal cancers.  Pelvic exam and Pap test. This may be done every 3 years starting at age 13. Starting at age 58, this may be done every 5 years if you have a Pap test in combination with an HPV test.  Bone density scan. This is done to screen for osteoporosis. You may have this scan if you are at high risk for osteoporosis. Discuss your test results, treatment options, and if necessary, the need for more tests with your health care provider. Vaccines  Your health care provider may recommend certain vaccines, such as:  Influenza vaccine. This is recommended every year.  Tetanus, diphtheria, and acellular pertussis (Tdap, Td) vaccine. You may need a Td booster every 10 years.  Zoster vaccine. You may need this after age 57.  Pneumococcal 13-valent conjugate (PCV13) vaccine. You may need this if you have certain conditions and were not previously vaccinated.  Pneumococcal polysaccharide (PPSV23) vaccine. You may need one or two doses if you smoke cigarettes or if you have certain conditions. Talk to your health care provider about which screenings and vaccines you need and how often you need them. This information is not intended to replace advice given to you by your health care provider. Make sure you discuss any questions you have with your health care provider. Document Released: 12/08/2015 Document Revised: 07/31/2016 Document Reviewed: 09/12/2015 Elsevier Interactive Patient Education  2017 [place] Prevention in the Home Falls can cause injuries. They can happen to people of all ages. There are many things you can do to make your home safe and to help prevent falls. What can I do on the outside of my home?  Regularly fix the edges of walkways and driveways and fix any cracks.  Remove anything that might make you trip as you walk through a door, such as a raised step or threshold.  Trim any bushes or trees on  the path to your home.  Use bright outdoor lighting.  Clear any walking paths of anything that might make someone trip, such as rocks or tools.  Regularly check to see if handrails are loose or broken. Make sure that both sides of any steps have handrails.  Any raised decks and porches should have guardrails on the edges.  Have any leaves, snow, or ice cleared regularly.  Use sand or salt on walking paths during winter.  Clean up any spills in your garage right away. This includes oil or grease spills. What can I do in the bathroom?  Use night lights.  Install grab bars by the toilet and in the tub and shower. Do not use towel bars as grab bars.  Use non-skid mats or decals in the tub or shower.  If you need to sit down in the shower, use a plastic, non-slip stool.  Keep the floor dry. Clean up any water that spills on the floor as soon as it happens.  Remove soap buildup in  the tub or shower regularly.  Attach bath mats securely with double-sided non-slip rug tape.  Do not have throw rugs and other things on the floor that can make you trip. What can I do in the bedroom?  Use night lights.  Make sure that you have a light by your bed that is easy to reach.  Do not use any sheets or blankets that are too big for your bed. They should not hang down onto the floor.  Have a firm chair that has side arms. You can use this for support while you get dressed.  Do not have throw rugs and other things on the floor that can make you trip. What can I do in the kitchen?  Clean up any spills right away.  Avoid walking on wet floors.  Keep items that you use a lot in easy-to-reach places.  If you need to reach something above you, use a strong step stool that has a grab bar.  Keep electrical cords out of the way.  Do not use floor polish or wax that makes floors slippery. If you must use wax, use non-skid floor wax.  Do not have throw rugs and other things on the floor that  can make you trip. What can I do with my stairs?  Do not leave any items on the stairs.  Make sure that there are handrails on both sides of the stairs and use them. Fix handrails that are broken or loose. Make sure that handrails are as long as the stairways.  Check any carpeting to make sure that it is firmly attached to the stairs. Fix any carpet that is loose or worn.  Avoid having throw rugs at the top or bottom of the stairs. If you do have throw rugs, attach them to the floor with carpet tape.  Make sure that you have a light switch at the top of the stairs and the bottom of the stairs. If you do not have them, ask someone to add them for you. What else can I do to help prevent falls?  Wear shoes that:  Do not have high heels.  Have rubber bottoms.  Are comfortable and fit you well.  Are closed at the toe. Do not wear sandals.  If you use a stepladder:  Make sure that it is fully opened. Do not climb a closed stepladder.  Make sure that both sides of the stepladder are locked into place.  Ask someone to hold it for you, if possible.  Clearly mark and make sure that you can see:  Any grab bars or handrails.  First and last steps.  Where the edge of each step is.  Use tools that help you move around (mobility aids) if they are needed. These include:  Canes.  Walkers.  Scooters.  Crutches.  Turn on the lights when you go into a dark area. Replace any light bulbs as soon as they burn out.  Set up your furniture so you have a clear path. Avoid moving your furniture around.  If any of your floors are uneven, fix them.  If there are any pets around you, be aware of where they are.  Review your medicines with your doctor. Some medicines can make you feel dizzy. This can increase your chance of falling. Ask your doctor what other things that you can do to help prevent falls. This information is not intended to replace advice given to you by your health care  provider. Make sure  you discuss any questions you have with your health care provider. Document Released: 09/07/2009 Document Revised: 04/18/2016 Document Reviewed: 12/16/2014 Elsevier Interactive Patient Education  2017 [employer].
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
## 2020-12-17 ENCOUNTER — Ambulatory Visit
Admission: RE | Admit: 2020-12-17 | Discharge: 2020-12-17 | Disposition: A | Discharge status: Home / Self Care | Payer: Medicare Other | Source: Ambulatory Visit | Attending: Orthopedic Surgery | Admitting: Orthopedic Surgery

## 2020-12-17 ENCOUNTER — Other Ambulatory Visit: Payer: Self-pay

## 2020-12-17 DIAGNOSIS — M25561 Pain in right knee: Secondary | ICD-10-CM | POA: Diagnosis not present

## 2020-12-17 DIAGNOSIS — S83281A Other tear of lateral meniscus, current injury, right knee, initial encounter: Secondary | ICD-10-CM

## 2020-12-25 DIAGNOSIS — M25561 Pain in right knee: Secondary | ICD-10-CM | POA: Diagnosis not present

## 2020-12-25 DIAGNOSIS — S83281A Other tear of lateral meniscus, current injury, right knee, initial encounter: Secondary | ICD-10-CM | POA: Diagnosis not present

## 2021-01-09 ENCOUNTER — Other Ambulatory Visit: Payer: Self-pay | Admitting: Psychiatry

## 2021-01-09 ENCOUNTER — Other Ambulatory Visit: Payer: Self-pay | Admitting: Internal Medicine

## 2021-01-09 DIAGNOSIS — F3342 Major depressive disorder, recurrent, in full remission: Secondary | ICD-10-CM

## 2021-01-09 DIAGNOSIS — F4310 Post-traumatic stress disorder, unspecified: Secondary | ICD-10-CM

## 2021-01-09 DIAGNOSIS — F411 Generalized anxiety disorder: Secondary | ICD-10-CM

## 2021-01-09 DIAGNOSIS — F4010 Social phobia, unspecified: Secondary | ICD-10-CM

## 2021-01-12 ENCOUNTER — Ambulatory Visit: Payer: Medicare Other | Admitting: Internal Medicine

## 2021-01-12 ENCOUNTER — Telehealth: Payer: Self-pay | Admitting: Internal Medicine

## 2021-01-12 NOTE — Telephone Encounter (Signed)
Patient no-showed today's appointment; appointment was for 2/18 at 7:30 am , provider notified for review of record. Letter sent for patient to call in and re-schedule.

## 2021-01-17 ENCOUNTER — Ambulatory Visit: Payer: Medicare Other | Admitting: Internal Medicine

## 2021-01-17 DIAGNOSIS — M23241 Derangement of anterior horn of lateral meniscus due to old tear or injury, right knee: Secondary | ICD-10-CM | POA: Diagnosis not present

## 2021-01-17 DIAGNOSIS — M94261 Chondromalacia, right knee: Secondary | ICD-10-CM | POA: Diagnosis not present

## 2021-01-17 DIAGNOSIS — G8918 Other acute postprocedural pain: Secondary | ICD-10-CM | POA: Diagnosis not present

## 2021-01-17 DIAGNOSIS — M2241 Chondromalacia patellae, right knee: Secondary | ICD-10-CM | POA: Diagnosis not present

## 2021-01-17 DIAGNOSIS — M6751 Plica syndrome, right knee: Secondary | ICD-10-CM | POA: Diagnosis not present

## 2021-01-17 HISTORY — PX: KNEE SURGERY: SHX244

## 2021-01-18 ENCOUNTER — Encounter: Payer: Self-pay | Admitting: Psychiatry

## 2021-01-18 ENCOUNTER — Other Ambulatory Visit: Payer: Self-pay

## 2021-01-18 ENCOUNTER — Telehealth (INDEPENDENT_AMBULATORY_CARE_PROVIDER_SITE_OTHER): Payer: Medicare Other | Admitting: Psychiatry

## 2021-01-18 DIAGNOSIS — F411 Generalized anxiety disorder: Secondary | ICD-10-CM | POA: Diagnosis not present

## 2021-01-18 DIAGNOSIS — F4010 Social phobia, unspecified: Secondary | ICD-10-CM | POA: Diagnosis not present

## 2021-01-18 DIAGNOSIS — F4310 Post-traumatic stress disorder, unspecified: Secondary | ICD-10-CM | POA: Diagnosis not present

## 2021-01-18 DIAGNOSIS — G4701 Insomnia due to medical condition: Secondary | ICD-10-CM

## 2021-01-18 DIAGNOSIS — F410 Panic disorder [episodic paroxysmal anxiety] without agoraphobia: Secondary | ICD-10-CM | POA: Diagnosis not present

## 2021-01-18 DIAGNOSIS — F3342 Major depressive disorder, recurrent, in full remission: Secondary | ICD-10-CM | POA: Diagnosis not present

## 2021-01-18 MED ORDER — LAMOTRIGINE 25 MG PO TABS: 25.0000 mg | ORAL_TABLET | Freq: Two times a day (BID) | ORAL | 1 refills | Status: DC

## 2021-01-18 MED ORDER — ZOLPIDEM TARTRATE ER 12.5 MG PO TBCR: 12.5000 mg | EXTENDED_RELEASE_TABLET | Freq: Every evening | ORAL | 2 refills | Status: DC | PRN
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
## 2021-02-09 DIAGNOSIS — M25561 Pain in right knee: Secondary | ICD-10-CM | POA: Diagnosis not present

## 2021-02-09 DIAGNOSIS — R262 Difficulty in walking, not elsewhere classified: Secondary | ICD-10-CM | POA: Diagnosis not present

## 2021-02-09 DIAGNOSIS — M6281 Muscle weakness (generalized): Secondary | ICD-10-CM | POA: Diagnosis not present

## 2021-02-13 DIAGNOSIS — M25561 Pain in right knee: Secondary | ICD-10-CM | POA: Diagnosis not present

## 2021-02-13 DIAGNOSIS — R262 Difficulty in walking, not elsewhere classified: Secondary | ICD-10-CM | POA: Diagnosis not present

## 2021-02-13 DIAGNOSIS — M6281 Muscle weakness (generalized): Secondary | ICD-10-CM | POA: Diagnosis not present

## 2021-02-14 ENCOUNTER — Ambulatory Visit: Payer: Medicare Other | Admitting: Internal Medicine

## 2021-02-14 ENCOUNTER — Telehealth: Payer: Self-pay | Admitting: Internal Medicine

## 2021-02-14 ENCOUNTER — Other Ambulatory Visit: Payer: Self-pay | Admitting: Psychiatry

## 2021-02-14 DIAGNOSIS — F4310 Post-traumatic stress disorder, unspecified: Secondary | ICD-10-CM

## 2021-02-14 DIAGNOSIS — F3342 Major depressive disorder, recurrent, in full remission: Secondary | ICD-10-CM

## 2021-02-14 DIAGNOSIS — Z0289 Encounter for other administrative examinations: Secondary | ICD-10-CM

## 2021-02-14 DIAGNOSIS — F411 Generalized anxiety disorder: Secondary | ICD-10-CM

## 2021-02-14 NOTE — Telephone Encounter (Signed)
Patient no-showed today's appointment; appointment was for 02/14/21, provider notified for review of record. Letter sent for patient to call in and re-schedule.

## 2021-02-20 ENCOUNTER — Telehealth: Payer: Self-pay

## 2021-02-20 DIAGNOSIS — M6281 Muscle weakness (generalized): Secondary | ICD-10-CM | POA: Diagnosis not present

## 2021-02-20 DIAGNOSIS — R262 Difficulty in walking, not elsewhere classified: Secondary | ICD-10-CM | POA: Diagnosis not present

## 2021-02-20 DIAGNOSIS — M25561 Pain in right knee: Secondary | ICD-10-CM | POA: Diagnosis not present

## 2021-02-20 NOTE — Telephone Encounter (Signed)
prior auth for the zolpidem tartrate er was denied. pt needs to have tried doxepin hcl or belsomra

## 2021-02-20 NOTE — Telephone Encounter (Signed)
Please verify with patient since she paying for Ambien herself since she wants to stay on it and that is the only medication that has helped.

## 2021-02-23 DIAGNOSIS — M6281 Muscle weakness (generalized): Secondary | ICD-10-CM | POA: Diagnosis not present

## 2021-02-23 DIAGNOSIS — R262 Difficulty in walking, not elsewhere classified: Secondary | ICD-10-CM | POA: Diagnosis not present

## 2021-02-23 DIAGNOSIS — M25561 Pain in right knee: Secondary | ICD-10-CM | POA: Diagnosis not present

## 2021-02-23 NOTE — Telephone Encounter (Signed)
She is still taking paying for it.

## 2021-02-26 DIAGNOSIS — M6281 Muscle weakness (generalized): Secondary | ICD-10-CM | POA: Diagnosis not present

## 2021-02-26 DIAGNOSIS — M25561 Pain in right knee: Secondary | ICD-10-CM | POA: Diagnosis not present

## 2021-02-26 DIAGNOSIS — R262 Difficulty in walking, not elsewhere classified: Secondary | ICD-10-CM | POA: Diagnosis not present

## 2021-03-01 DIAGNOSIS — M6281 Muscle weakness (generalized): Secondary | ICD-10-CM | POA: Diagnosis not present

## 2021-03-01 DIAGNOSIS — R262 Difficulty in walking, not elsewhere classified: Secondary | ICD-10-CM | POA: Diagnosis not present

## 2021-03-01 DIAGNOSIS — M25561 Pain in right knee: Secondary | ICD-10-CM | POA: Diagnosis not present

## 2021-03-05 DIAGNOSIS — M25561 Pain in right knee: Secondary | ICD-10-CM | POA: Diagnosis not present

## 2021-03-05 DIAGNOSIS — R262 Difficulty in walking, not elsewhere classified: Secondary | ICD-10-CM | POA: Diagnosis not present

## 2021-03-05 DIAGNOSIS — M6281 Muscle weakness (generalized): Secondary | ICD-10-CM | POA: Diagnosis not present

## 2021-03-06 ENCOUNTER — Encounter: Payer: Self-pay | Admitting: Internal Medicine

## 2021-03-06 NOTE — Telephone Encounter (Signed)
If having persistent or recurring problems with her ear, needs an evaluation.  Also, if needs referral prior to Dr Olivia Mackie return, then need to clarify what specific symptoms she is having and where going to PT.

## 2021-03-12 DIAGNOSIS — M6281 Muscle weakness (generalized): Secondary | ICD-10-CM | POA: Diagnosis not present

## 2021-03-12 DIAGNOSIS — M25561 Pain in right knee: Secondary | ICD-10-CM | POA: Diagnosis not present

## 2021-03-12 DIAGNOSIS — R262 Difficulty in walking, not elsewhere classified: Secondary | ICD-10-CM | POA: Diagnosis not present

## 2021-03-20 ENCOUNTER — Ambulatory Visit: Payer: Medicare Other | Admitting: Psychiatry

## 2021-03-28 ENCOUNTER — Other Ambulatory Visit: Payer: Self-pay | Admitting: Internal Medicine

## 2021-03-28 DIAGNOSIS — J329 Chronic sinusitis, unspecified: Secondary | ICD-10-CM

## 2021-04-11 ENCOUNTER — Ambulatory Visit
Admission: RE | Admit: 2021-04-11 | Discharge: 2021-04-11 | Disposition: A | Discharge status: Home / Self Care | Payer: Medicare Other | Source: Ambulatory Visit | Attending: Internal Medicine | Admitting: Internal Medicine

## 2021-04-11 ENCOUNTER — Other Ambulatory Visit: Payer: Self-pay

## 2021-04-11 DIAGNOSIS — Z1231 Encounter for screening mammogram for malignant neoplasm of breast: Secondary | ICD-10-CM | POA: Insufficient documentation

## 2021-04-13 ENCOUNTER — Other Ambulatory Visit: Payer: Self-pay | Admitting: Internal Medicine

## 2021-04-24 ENCOUNTER — Other Ambulatory Visit: Payer: Self-pay

## 2021-04-24 ENCOUNTER — Telehealth (INDEPENDENT_AMBULATORY_CARE_PROVIDER_SITE_OTHER): Payer: Medicare Other | Admitting: Psychiatry

## 2021-04-24 ENCOUNTER — Ambulatory Visit: Payer: Medicare Other | Admitting: Psychiatry

## 2021-04-24 ENCOUNTER — Encounter: Payer: Self-pay | Admitting: Psychiatry

## 2021-04-24 DIAGNOSIS — F3342 Major depressive disorder, recurrent, in full remission: Secondary | ICD-10-CM | POA: Diagnosis not present

## 2021-04-24 DIAGNOSIS — G4701 Insomnia due to medical condition: Secondary | ICD-10-CM

## 2021-04-24 DIAGNOSIS — F411 Generalized anxiety disorder: Secondary | ICD-10-CM | POA: Diagnosis not present

## 2021-04-24 DIAGNOSIS — F4010 Social phobia, unspecified: Secondary | ICD-10-CM | POA: Diagnosis not present

## 2021-04-24 DIAGNOSIS — F4310 Post-traumatic stress disorder, unspecified: Secondary | ICD-10-CM

## 2021-04-24 DIAGNOSIS — F410 Panic disorder [episodic paroxysmal anxiety] without agoraphobia: Secondary | ICD-10-CM | POA: Diagnosis not present

## 2021-04-24 MED ORDER — BUPROPION HCL ER (SR) 100 MG PO TB12
100.0000 mg | ORAL_TABLET | Freq: Every day | ORAL | 0 refills | Status: DC
Start: 2021-04-24 — End: 2021-07-04
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
## 2021-05-24 ENCOUNTER — Other Ambulatory Visit: Payer: Self-pay | Admitting: Internal Medicine

## 2021-05-24 DIAGNOSIS — G8929 Other chronic pain: Secondary | ICD-10-CM

## 2021-05-24 DIAGNOSIS — F419 Anxiety disorder, unspecified: Secondary | ICD-10-CM

## 2021-05-24 DIAGNOSIS — F32A Depression, unspecified: Secondary | ICD-10-CM

## 2021-05-24 NOTE — Telephone Encounter (Signed)
TV Refill:tramadol Last Seen:09-13-20 Last ordered:09-13-20

## 2021-05-29 DIAGNOSIS — M25561 Pain in right knee: Secondary | ICD-10-CM | POA: Diagnosis not present

## 2021-06-06 ENCOUNTER — Telehealth: Payer: Medicare Other | Admitting: Psychiatry

## 2021-06-26 DIAGNOSIS — M1711 Unilateral primary osteoarthritis, right knee: Secondary | ICD-10-CM | POA: Diagnosis not present

## 2021-07-04 ENCOUNTER — Encounter: Payer: Self-pay | Admitting: Psychiatry

## 2021-07-04 ENCOUNTER — Telehealth (INDEPENDENT_AMBULATORY_CARE_PROVIDER_SITE_OTHER): Payer: Medicare Other | Admitting: Psychiatry

## 2021-07-04 ENCOUNTER — Other Ambulatory Visit: Payer: Self-pay

## 2021-07-04 DIAGNOSIS — F410 Panic disorder [episodic paroxysmal anxiety] without agoraphobia: Secondary | ICD-10-CM | POA: Diagnosis not present

## 2021-07-04 DIAGNOSIS — F411 Generalized anxiety disorder: Secondary | ICD-10-CM | POA: Diagnosis not present

## 2021-07-04 DIAGNOSIS — G4701 Insomnia due to medical condition: Secondary | ICD-10-CM

## 2021-07-04 DIAGNOSIS — F4310 Post-traumatic stress disorder, unspecified: Secondary | ICD-10-CM

## 2021-07-04 DIAGNOSIS — F4010 Social phobia, unspecified: Secondary | ICD-10-CM

## 2021-07-04 DIAGNOSIS — F3342 Major depressive disorder, recurrent, in full remission: Secondary | ICD-10-CM
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
## 2021-07-12 ENCOUNTER — Other Ambulatory Visit: Payer: Self-pay | Admitting: Internal Medicine

## 2021-07-12 ENCOUNTER — Other Ambulatory Visit: Payer: Self-pay | Admitting: Psychiatry

## 2021-07-12 DIAGNOSIS — M25561 Pain in right knee: Secondary | ICD-10-CM | POA: Diagnosis not present

## 2021-07-12 DIAGNOSIS — M1711 Unilateral primary osteoarthritis, right knee: Secondary | ICD-10-CM | POA: Diagnosis not present

## 2021-07-12 DIAGNOSIS — F4310 Post-traumatic stress disorder, unspecified: Secondary | ICD-10-CM

## 2021-07-12 DIAGNOSIS — F3342 Major depressive disorder, recurrent, in full remission: Secondary | ICD-10-CM

## 2021-07-12 DIAGNOSIS — F411 Generalized anxiety disorder: Secondary | ICD-10-CM

## 2021-07-12 DIAGNOSIS — M1990 Unspecified osteoarthritis, unspecified site: Secondary | ICD-10-CM

## 2021-07-17 NOTE — Telephone Encounter (Signed)
Called and spoke with Erika Hanson. Erika Hanson states that she would like to know what else she can take. Pt was taking celebrex before but had stopped taking it to switch to Meloxicam. Pt states that Meloxicam does not work and she had to go back to the celebrex. Pt scheduled for an appointment with Erika Hanson on 08/08/21 to discuss her medication. Pt asks for a small amount of celebrex to cover her until her appointment to change her medication.

## 2021-07-19 DIAGNOSIS — M1711 Unilateral primary osteoarthritis, right knee: Secondary | ICD-10-CM | POA: Diagnosis not present

## 2021-07-20 NOTE — Telephone Encounter (Signed)
Called and spoke to [place]. Erika Hanson verbalized understanding and asked if we had any records on file from Dr. Merita Norton at [facility]. Erika Hanson states that she sees her kidney doctor yearly and has not had an issue with the celebrex. Erika Hanson had no other concerns and will see margaret on 08/08/21

## 2021-07-26 DIAGNOSIS — M1711 Unilateral primary osteoarthritis, right knee: Secondary | ICD-10-CM | POA: Diagnosis not present

## 2021-08-07 ENCOUNTER — Ambulatory Visit: Payer: Medicare Other | Admitting: Psychiatry

## 2021-08-08 ENCOUNTER — Ambulatory Visit: Payer: Medicare Other | Admitting: Family

## 2021-08-09 ENCOUNTER — Ambulatory Visit (INDEPENDENT_AMBULATORY_CARE_PROVIDER_SITE_OTHER): Payer: Medicare Other | Admitting: Psychiatry

## 2021-08-09 ENCOUNTER — Other Ambulatory Visit: Payer: Self-pay

## 2021-08-09 ENCOUNTER — Encounter: Payer: Self-pay | Admitting: Psychiatry

## 2021-08-09 VITALS — BP 157/69 | HR 76 | Temp 97.6°F | Wt 334.6 lb

## 2021-08-09 DIAGNOSIS — F331 Major depressive disorder, recurrent, moderate: Secondary | ICD-10-CM | POA: Diagnosis not present

## 2021-08-09 DIAGNOSIS — F4010 Social phobia, unspecified: Secondary | ICD-10-CM

## 2021-08-09 DIAGNOSIS — F4310 Post-traumatic stress disorder, unspecified: Secondary | ICD-10-CM

## 2021-08-09 DIAGNOSIS — I10 Essential (primary) hypertension: Secondary | ICD-10-CM

## 2021-08-09 DIAGNOSIS — G4701 Insomnia due to medical condition: Secondary | ICD-10-CM | POA: Diagnosis not present

## 2021-08-09 DIAGNOSIS — F411 Generalized anxiety disorder: Secondary | ICD-10-CM | POA: Diagnosis not present

## 2021-08-09 MED ORDER — BUPROPION HCL ER (SR) 100 MG PO TB12: 100.0000 mg | ORAL_TABLET | Freq: Two times a day (BID) | ORAL | 1 refills | Status: DC

## 2021-08-09 MED ORDER — LAMOTRIGINE 25 MG PO TABS
75.0000 mg | ORAL_TABLET | Freq: Every day | ORAL | 0 refills | Status: DC
Start: 2021-08-09 — End: 2021-11-08

## 2021-08-09 MED ORDER — BREXPIPRAZOLE 0.25 MG PO TABS: 0.2500 mg | ORAL_TABLET | ORAL | 0 refills | Status: DC
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
## 2021-08-29 ENCOUNTER — Telehealth (INDEPENDENT_AMBULATORY_CARE_PROVIDER_SITE_OTHER): Payer: Medicare Other | Admitting: Psychiatry

## 2021-08-29 ENCOUNTER — Other Ambulatory Visit: Payer: Self-pay

## 2021-08-29 ENCOUNTER — Encounter: Payer: Self-pay | Admitting: Psychiatry

## 2021-08-29 DIAGNOSIS — F4010 Social phobia, unspecified: Secondary | ICD-10-CM

## 2021-08-29 DIAGNOSIS — F3342 Major depressive disorder, recurrent, in full remission: Secondary | ICD-10-CM

## 2021-08-29 DIAGNOSIS — F411 Generalized anxiety disorder: Secondary | ICD-10-CM

## 2021-08-29 DIAGNOSIS — F410 Panic disorder [episodic paroxysmal anxiety] without agoraphobia: Secondary | ICD-10-CM | POA: Diagnosis not present

## 2021-08-29 DIAGNOSIS — G4701 Insomnia due to medical condition: Secondary | ICD-10-CM | POA: Diagnosis not present

## 2021-08-29 DIAGNOSIS — F4310 Post-traumatic stress disorder, unspecified: Secondary | ICD-10-CM | POA: Diagnosis not present
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
## 2021-09-03 ENCOUNTER — Other Ambulatory Visit: Payer: Self-pay | Admitting: Internal Medicine

## 2021-09-03 DIAGNOSIS — K219 Gastro-esophageal reflux disease without esophagitis: Secondary | ICD-10-CM

## 2021-09-10 ENCOUNTER — Other Ambulatory Visit: Payer: Self-pay | Admitting: Internal Medicine

## 2021-09-10 DIAGNOSIS — I10 Essential (primary) hypertension: Secondary | ICD-10-CM

## 2021-09-24 ENCOUNTER — Other Ambulatory Visit: Payer: Self-pay | Admitting: Internal Medicine

## 2021-09-24 DIAGNOSIS — K581 Irritable bowel syndrome with constipation: Secondary | ICD-10-CM

## 2021-10-10 ENCOUNTER — Other Ambulatory Visit: Payer: Self-pay | Admitting: Internal Medicine

## 2021-10-10 ENCOUNTER — Other Ambulatory Visit: Payer: Self-pay | Admitting: Psychiatry

## 2021-10-10 DIAGNOSIS — F411 Generalized anxiety disorder: Secondary | ICD-10-CM

## 2021-10-10 DIAGNOSIS — M545 Low back pain, unspecified: Secondary | ICD-10-CM

## 2021-10-10 DIAGNOSIS — M25559 Pain in unspecified hip: Secondary | ICD-10-CM

## 2021-10-10 DIAGNOSIS — I10 Essential (primary) hypertension: Secondary | ICD-10-CM

## 2021-10-10 DIAGNOSIS — F331 Major depressive disorder, recurrent, moderate: Secondary | ICD-10-CM

## 2021-10-10 DIAGNOSIS — F4310 Post-traumatic stress disorder, unspecified: Secondary | ICD-10-CM

## 2021-10-10 DIAGNOSIS — M62838 Other muscle spasm: Secondary | ICD-10-CM

## 2021-10-16 ENCOUNTER — Ambulatory Visit: Payer: Medicare Other | Admitting: Psychiatry

## 2021-10-24 ENCOUNTER — Ambulatory Visit: Payer: Medicare Other | Admitting: Psychiatry

## 2021-11-08 ENCOUNTER — Other Ambulatory Visit: Payer: Self-pay | Admitting: Psychiatry

## 2021-11-08 DIAGNOSIS — F411 Generalized anxiety disorder: Secondary | ICD-10-CM

## 2021-11-08 DIAGNOSIS — F331 Major depressive disorder, recurrent, moderate: Secondary | ICD-10-CM

## 2021-11-15 ENCOUNTER — Telehealth (INDEPENDENT_AMBULATORY_CARE_PROVIDER_SITE_OTHER): Payer: Self-pay | Admitting: Psychiatry

## 2021-11-15 ENCOUNTER — Other Ambulatory Visit: Payer: Self-pay

## 2021-11-15 DIAGNOSIS — F4310 Post-traumatic stress disorder, unspecified: Secondary | ICD-10-CM

## 2021-11-15 NOTE — Progress Notes (Signed)
At the time of appointment writer connected with patient.  However patient was driving.  Advised patient it is not safe to drive and have an evaluation done at the same time.  Patient agrees to call the clinic back.

## 2021-11-26 ENCOUNTER — Encounter: Payer: Self-pay | Admitting: Internal Medicine

## 2021-11-27 ENCOUNTER — Telehealth (INDEPENDENT_AMBULATORY_CARE_PROVIDER_SITE_OTHER): Payer: Medicare Other | Admitting: Family

## 2021-11-27 ENCOUNTER — Other Ambulatory Visit: Payer: Self-pay

## 2021-11-27 ENCOUNTER — Encounter: Payer: Self-pay | Admitting: Family

## 2021-11-27 VITALS — Ht 65.0 in

## 2021-11-27 DIAGNOSIS — B9689 Other specified bacterial agents as the cause of diseases classified elsewhere: Secondary | ICD-10-CM

## 2021-11-27 DIAGNOSIS — J329 Chronic sinusitis, unspecified: Secondary | ICD-10-CM

## 2021-11-27 MED ORDER — AMOXICILLIN 500 MG PO CAPS: 500.0000 mg | ORAL_CAPSULE | Freq: Three times a day (TID) | ORAL | 0 refills | Status: AC

## 2021-11-27 MED ORDER — FLUCONAZOLE 150 MG PO TABS: 150.0000 mg | ORAL_TABLET | Freq: Once | ORAL | 0 refills | Status: AC

## 2021-11-27 NOTE — Telephone Encounter (Signed)
Spoke with pt and scheduled her for a virtual visit.

## 2021-11-27 NOTE — Progress Notes (Signed)
Virtual Visit via Telephone Note  I connected with Erika Hanson on 11/27/21 at  2:15 PM EST by telephone and verified that I am speaking with the correct person using two identifiers.  Location: Patient: home Provider: [employer]   I discussed the limitations, risks, security and privacy concerns of performing an evaluation and management service by telephone and the availability of in person appointments. I also discussed with the patient that there may be a patient responsible charge related to this service. The patient expressed understanding and agreed to proceed.   History of Present Illness: 56 year old female presents with c/o sinus pressure, sinus pain,, green phlegm, and fatigue x 1 weeks and worsening. She reports testing for COVID and was negative. She also reports her significant other being sick as well.     Observations/Objective: A&o, NAD   Assessment and Plan: Erika Hanson was seen today for acute home visit.  Diagnoses and all orders for this visit:  Sinusitis, bacterial  Other orders -     amoxicillin (AMOXIL) 500 MG capsule; Take 1 capsule (500 mg total) by mouth 3 (three) times daily for 10 days. -     fluconazole (DIFLUCAN) 150 MG tablet; Take 1 tablet (150 mg total) by mouth once for 1 dose.     Call the office if symptoms worsen or persist. Recheck as scheduled an sooner as needed.  Follow Up Instructions:    I discussed the assessment and treatment plan with the patient. The patient was provided an opportunity to ask questions and all were answered. The patient agreed with the plan and demonstrated an understanding of the instructions.   The patient was advised to call back or seek an in-person evaluation if the symptoms worsen or if the condition fails to improve as anticipated.  I provided 20 minutes of non-face-to-face time during this encounter.   Kennyth Arnold, FNP

## 2021-12-10 ENCOUNTER — Other Ambulatory Visit: Payer: Self-pay | Admitting: Psychiatry

## 2021-12-10 DIAGNOSIS — F411 Generalized anxiety disorder: Secondary | ICD-10-CM

## 2021-12-10 DIAGNOSIS — F331 Major depressive disorder, recurrent, moderate: Secondary | ICD-10-CM

## 2021-12-12 ENCOUNTER — Ambulatory Visit: Payer: Medicare Other

## 2021-12-19 ENCOUNTER — Ambulatory Visit: Payer: Medicare Other | Admitting: Internal Medicine

## 2021-12-26 ENCOUNTER — Encounter: Payer: Self-pay | Admitting: Internal Medicine

## 2021-12-26 ENCOUNTER — Other Ambulatory Visit: Payer: Self-pay

## 2021-12-26 ENCOUNTER — Ambulatory Visit (INDEPENDENT_AMBULATORY_CARE_PROVIDER_SITE_OTHER): Payer: Medicare Other | Admitting: Internal Medicine

## 2021-12-26 VITALS — BP 136/78 | HR 84 | Temp 98.0°F | Resp 16 | Ht 65.0 in | Wt 349.0 lb

## 2021-12-26 DIAGNOSIS — I10 Essential (primary) hypertension: Secondary | ICD-10-CM | POA: Diagnosis not present

## 2021-12-26 DIAGNOSIS — E559 Vitamin D deficiency, unspecified: Secondary | ICD-10-CM | POA: Diagnosis not present

## 2021-12-26 DIAGNOSIS — N1831 Chronic kidney disease, stage 3a: Secondary | ICD-10-CM

## 2021-12-26 DIAGNOSIS — Z1231 Encounter for screening mammogram for malignant neoplasm of breast: Secondary | ICD-10-CM

## 2021-12-26 DIAGNOSIS — Z1211 Encounter for screening for malignant neoplasm of colon: Secondary | ICD-10-CM | POA: Diagnosis not present

## 2021-12-26 DIAGNOSIS — Z1329 Encounter for screening for other suspected endocrine disorder: Secondary | ICD-10-CM | POA: Diagnosis not present

## 2021-12-26 DIAGNOSIS — D509 Iron deficiency anemia, unspecified: Secondary | ICD-10-CM

## 2021-12-26 DIAGNOSIS — Z1389 Encounter for screening for other disorder: Secondary | ICD-10-CM | POA: Diagnosis not present

## 2021-12-26 DIAGNOSIS — R0982 Postnasal drip: Secondary | ICD-10-CM

## 2021-12-26 DIAGNOSIS — R7303 Prediabetes: Secondary | ICD-10-CM | POA: Diagnosis not present

## 2021-12-26 DIAGNOSIS — E611 Iron deficiency: Secondary | ICD-10-CM

## 2021-12-26 DIAGNOSIS — Z6841 Body Mass Index (BMI) 40.0 and over, adult: Secondary | ICD-10-CM

## 2021-12-26 MED ORDER — WEGOVY 1 MG/0.5ML ~~LOC~~ SOAJ: 1.0000 mg | SUBCUTANEOUS | 0 refills | Status: DC

## 2021-12-26 MED ORDER — LOSARTAN POTASSIUM 50 MG PO TABS: 50.0000 mg | ORAL_TABLET | Freq: Every day | ORAL | 3 refills | Status: DC

## 2021-12-26 MED ORDER — WEGOVY 2.4 MG/0.75ML ~~LOC~~ SOAJ: 2.4000 mg | SUBCUTANEOUS | 5 refills | Status: DC

## 2021-12-26 MED ORDER — WEGOVY 0.25 MG/0.5ML ~~LOC~~ SOAJ: 0.2500 mg | SUBCUTANEOUS | 0 refills | Status: DC

## 2021-12-26 MED ORDER — WEGOVY 0.5 MG/0.5ML ~~LOC~~ SOAJ: 0.5000 mg | SUBCUTANEOUS | 0 refills | Status: DC

## 2021-12-26 MED ORDER — WEGOVY 1.7 MG/0.75ML ~~LOC~~ SOAJ: 1.7000 mg | SUBCUTANEOUS | 0 refills | Status: DC

## 2021-12-26 NOTE — Patient Instructions (Addendum)
Colonoscopy Bowel Preparation Instructions: CLENPIQ [email or web address]  health  articles  2121... Jan 24, 2019 -- Bowel cleansing can be done using prescription CLENPIQ on a two-part schedule before a colonoscopy. You need to drink clear liquids only   Let me know when ready for colonoscopy   Collagen or co Q10 for joint issues or pain    [address] [place]   [place], [place] [address]   [phone]   Dr. Candiss Norse or [facility] or [place], [place] [address]   [phone] (Work)   [phone] (Fax)     Mammogram due 04/11/22  Call and schedule pap smear please  Think about shingles vaccine  MD Physician   Primary Contact Information  Phone Fax E-mail Address  [phone] Not available [address]   [place] [address]     Specialties       Semaglutide Injection (Weight Management) What is this medication? SEMAGLUTIDE (SEM a GLOO tide) promotes weight loss. It may also be used to maintain weight loss. It works by decreasing appetite. Changes to diet and exercise are often combined with this medication. This medicine may be used for other purposes; ask your health care provider or pharmacist if you have questions. COMMON BRAND NAME(S): TFTDDU What should I tell my care team before I take this medication? They need to know if you have any of these conditions: Endocrine tumors (MEN 2) or if someone in your family had these tumors Eye disease, vision problems Gallbladder disease History of depression or mental health disease History of pancreatitis Kidney disease Stomach or intestine problems Suicidal thoughts, plans, or attempt; a previous suicide attempt by you or a family member Thyroid cancer or if someone in your family had thyroid cancer An unusual or allergic reaction to semaglutide, other medications, foods, dyes, or preservatives Pregnant  or trying to get pregnant Breast-feeding How should I use this medication? This medication is injected under the skin. You will be taught how to prepare and give it. Take it as directed on the prescription label. It is given once every week (every 7 days). Keep taking it unless your care team tells you to stop. It is important that you put your used needles and pens in a special sharps container. Do not put them in a trash can. If you do not have a sharps container, call your pharmacist or care team to get one. A special MedGuide will be given to you by the pharmacist with each prescription and refill. Be sure to read this information carefully each time. This medication comes with INSTRUCTIONS FOR USE. Ask your pharmacist for directions on how to use this medication. Read the information carefully. Talk to your pharmacist or care team if you have questions. Talk to your care team about the use of this medication in children. Special care may be needed. Overdosage: If you think you have taken too much of this medicine contact a poison control center or emergency room at once. NOTE: This medicine is only for you. Do not share this medicine with others. What if I miss a dose? If you miss a dose and the next scheduled dose is more than 2 days away, take the missed dose as soon as possible. If you miss a dose and the next scheduled dose is less than 2 days away, do not take the missed dose. Take the next dose at your regular  time. Do not take double or extra doses. If you miss your dose for 2 weeks or more, take the next dose at your regular time or call your care team to talk about how to restart this medication. What may interact with this medication? Insulin and other medications for diabetes This list may not describe all possible interactions. Give your health care provider a list of all the medicines, herbs, non-prescription drugs, or dietary supplements you use. Also tell them if you smoke, drink  alcohol, or use illegal drugs. Some items may interact with your medicine. What should I watch for while using this medication? Visit your care team for regular checks on your progress. It may be some time before you see the benefit from this medication. Drink plenty of fluids while taking this medication. Check with your care team if you have severe diarrhea, nausea, and vomiting, or if you sweat a lot. The loss of too much body fluid may make it dangerous for you to take this medication. This medication may affect blood sugar levels. Ask your care team if changes in diet or medications are needed if you have diabetes. If you or your family notice any changes in your behavior, such as new or worsening depression, thoughts of harming yourself, anxiety, other unusual or disturbing thoughts, or memory loss, call your care team right away. Women should inform their care team if they wish to become pregnant or think they might be pregnant. Losing weight while pregnant is not advised and may cause harm to the unborn child. Talk to your care team for more information. What side effects may I notice from receiving this medication? Side effects that you should report to your care team as soon as possible: Allergic reactions--skin rash, itching, hives, swelling of the face, lips, tongue, or throat Change in vision Dehydration--increased thirst, dry mouth, feeling faint or lightheaded, headache, dark yellow or brown urine Gallbladder problems--severe stomach pain, nausea, vomiting, fever Heart palpitations--rapid, pounding, or irregular heartbeat Kidney injury--decrease in the amount of urine, swelling of the ankles, hands, or feet Pancreatitis--severe stomach pain that spreads to your back or gets worse after eating or when touched, fever, nausea, vomiting Thoughts of suicide or self-harm, worsening mood, feelings of depression Thyroid cancer--new mass or lump in the neck, pain or trouble swallowing, trouble  breathing, hoarseness Side effects that usually do not require medical attention (report to your care team if they continue or are bothersome): Diarrhea Loss of appetite Nausea Stomach pain Vomiting This list may not describe all possible side effects. Call your doctor for medical advice about side effects. You may report side effects to FDA at 1-800-FDA-1088. Where should I keep my medication? Keep out of the reach of children and pets. Refrigeration (preferred): Store in the refrigerator. Do not freeze. Keep this medication in the original container until you are ready to take it. Get rid of any unused medication after the expiration date. Room temperature: If needed, prior to cap removal, the pen can be stored at room temperature for up to 28 days. Protect from light. If it is stored at room temperature, get rid of any unused medication after 28 days or after it expires, whichever is first. It is important to get rid of the medication as soon as you no longer need it or it is expired. You can do this in two ways: Take the medication to a medication take-back program. Check with your pharmacy or law enforcement to find a location. If you cannot  return the medication, follow the directions in the [place]. NOTE: This sheet is a summary. It may not cover all possible information. If you have questions about this medicine, talk to your doctor, pharmacist, or health care provider.  2022 Elsevier/Gold Standard (2021-02-16 00:00:00)   Zoster Vaccine, Recombinant injection What is this medication? ZOSTER VACCINE (ZOS ter vak SEEN) is a vaccine used to reduce the risk of getting shingles. This vaccine is not used to treat shingles or nerve pain from shingles. This medicine may be used for other purposes; ask your health care provider or pharmacist if you have questions. COMMON BRAND NAME(S): [facility] What should I tell my care team before I take this medication? They need to know if you have any  of these conditions: cancer immune system problems an unusual or allergic reaction to Zoster vaccine, other medications, foods, dyes, or preservatives pregnant or trying to get pregnant breast-feeding How should I use this medication? This vaccine is injected into a muscle. It is given by a health care provider. A copy of Vaccine Information Statements will be given before each vaccination. Be sure to read this information carefully each time. This sheet may change often. Talk to your health care provider about the use of this vaccine in children. This vaccine is not approved for use in children. Overdosage: If you think you have taken too much of this medicine contact a poison control center or emergency room at once. NOTE: This medicine is only for you. Do not share this medicine with others. What if I miss a dose? Keep appointments for follow-up (booster) doses. It is important not to miss your dose. Call your health care provider if you are unable to keep an appointment. What may interact with this medication? medicines that suppress your immune system medicines to treat cancer steroid medicines like prednisone or cortisone This list may not describe all possible interactions. Give your health care provider a list of all the medicines, herbs, non-prescription drugs, or dietary supplements you use. Also tell them if you smoke, drink alcohol, or use illegal drugs. Some items may interact with your medicine. What should I watch for while using this medication? Visit your health care provider regularly. This vaccine, like all vaccines, may not fully protect everyone. What side effects may I notice from receiving this medication? Side effects that you should report to your doctor or health care professional as soon as possible: allergic reactions (skin rash, itching or hives; swelling of the face, lips, or tongue) trouble breathing Side effects that usually do not require medical attention  (report these to your doctor or health care professional if they continue or are bothersome): chills headache fever nausea pain, redness, or irritation at site where injected tiredness vomiting This list may not describe all possible side effects. Call your doctor for medical advice about side effects. You may report side effects to FDA at 1-800-FDA-1088. Where should I keep my medication? This vaccine is only given by a health care provider. It will not be stored at home. NOTE: This sheet is a summary. It may not cover all possible information. If you have questions about this medicine, talk to your doctor, pharmacist, or health care provider.  2022 Elsevier/Gold Standard (2021-07-31 00:00:00)
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
## 2022-01-08 ENCOUNTER — Other Ambulatory Visit: Payer: Self-pay | Admitting: Psychiatry

## 2022-01-08 DIAGNOSIS — F331 Major depressive disorder, recurrent, moderate: Secondary | ICD-10-CM

## 2022-01-08 DIAGNOSIS — F4310 Post-traumatic stress disorder, unspecified: Secondary | ICD-10-CM

## 2022-01-08 DIAGNOSIS — F411 Generalized anxiety disorder: Secondary | ICD-10-CM

## 2022-01-09 ENCOUNTER — Ambulatory Visit (INDEPENDENT_AMBULATORY_CARE_PROVIDER_SITE_OTHER): Payer: Medicare Other

## 2022-01-09 ENCOUNTER — Encounter: Payer: Self-pay | Admitting: Psychiatry

## 2022-01-09 ENCOUNTER — Ambulatory Visit (INDEPENDENT_AMBULATORY_CARE_PROVIDER_SITE_OTHER): Payer: Medicare Other | Admitting: Psychiatry

## 2022-01-09 ENCOUNTER — Other Ambulatory Visit: Payer: Self-pay

## 2022-01-09 VITALS — Ht 65.0 in | Wt 349.0 lb

## 2022-01-09 VITALS — BP 141/83 | HR 88 | Temp 97.9°F | Ht 64.0 in | Wt 350.0 lb

## 2022-01-09 DIAGNOSIS — F410 Panic disorder [episodic paroxysmal anxiety] without agoraphobia: Secondary | ICD-10-CM

## 2022-01-09 DIAGNOSIS — F4010 Social phobia, unspecified: Secondary | ICD-10-CM | POA: Diagnosis not present

## 2022-01-09 DIAGNOSIS — G4701 Insomnia due to medical condition: Secondary | ICD-10-CM

## 2022-01-09 DIAGNOSIS — F411 Generalized anxiety disorder: Secondary | ICD-10-CM | POA: Diagnosis not present

## 2022-01-09 DIAGNOSIS — F4310 Post-traumatic stress disorder, unspecified: Secondary | ICD-10-CM

## 2022-01-09 DIAGNOSIS — F3342 Major depressive disorder, recurrent, in full remission: Secondary | ICD-10-CM

## 2022-01-09 DIAGNOSIS — Z0000 Encounter for general adult medical examination without abnormal findings: Secondary | ICD-10-CM

## 2022-01-09 MED ORDER — BUPROPION HCL ER (SR) 100 MG PO TB12: 100.0000 mg | ORAL_TABLET | Freq: Two times a day (BID) | ORAL | 1 refills | Status: DC

## 2022-01-09 MED ORDER — LAMOTRIGINE 25 MG PO TABS: ORAL_TABLET | ORAL | 1 refills | Status: DC
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
## 2022-01-09 NOTE — Patient Instructions (Addendum)
Erika Hanson , Thank you for taking time to come for your Medicare Wellness Visit. I appreciate your ongoing commitment to your health goals. Please review the following plan we discussed and let me know if I can assist you in the future.   These are the goals we discussed:  Goals       Patient Stated     Healthy Lifestyle (pt-stated)      Healthy diet Stay active Good water intake        This is a list of the screening recommended for you and due dates:  Health Maintenance  Topic Date Due   COVID-19 Vaccine (3 - Booster for Moderna series) 01/11/2022*   Pap Smear  02/06/2022*   Zoster (Shingles) Vaccine (1 of 2) 03/25/2022*   Colon Cancer Screening  12/26/2022*   Mammogram  04/11/2022   Tetanus Vaccine  06/18/2028   Hepatitis C Screening: USPSTF Recommendation to screen - Ages 18-79 yo.  Completed   HIV Screening  Completed   HPV Vaccine  Aged Out   Flu Shot  Discontinued  *Topic was postponed. The date shown is not the original due date.   Advanced directives: not yet completed  Conditions/risks identified: none new  Follow up in one year for your annual wellness visit.   Preventive Care 40-64 Years, Female Preventive care refers to lifestyle choices and visits with your health care provider that can promote health and wellness. What does preventive care include? A yearly physical exam. This is also called an annual well check. Dental exams once or twice a year. Routine eye exams. Ask your health care provider how often you should have your eyes checked. Personal lifestyle choices, including: Daily care of your teeth and gums. Regular physical activity. Eating a healthy diet. Avoiding tobacco and drug use. Limiting alcohol use. Practicing safe sex. Taking low-dose aspirin daily starting at age 69. Taking vitamin and mineral supplements as recommended by your health care provider. What happens during an annual well check? The services and screenings done by your  health care provider during your annual well check will depend on your age, overall health, lifestyle risk factors, and family history of disease. Counseling  Your health care provider may ask you questions about your: Alcohol use. Tobacco use. Drug use. Emotional well-being. Home and relationship well-being. Sexual activity. Eating habits. Work and work Statistician. Method of birth control. Menstrual cycle. Pregnancy history. Screening  You may have the following tests or measurements: Height, weight, and BMI. Blood pressure. Lipid and cholesterol levels. These may be checked every 5 years, or more frequently if you are over 73 years old. Skin check. Lung cancer screening. You may have this screening every year starting at age 62 if you have a 30-pack-year history of smoking and currently smoke or have quit within the past 15 years. Fecal occult blood test (FOBT) of the stool. You may have this test every year starting at age 37. Flexible sigmoidoscopy or colonoscopy. You may have a sigmoidoscopy every 5 years or a colonoscopy every 10 years starting at age 86. Hepatitis C blood test. Hepatitis B blood test. Sexually transmitted disease (STD) testing. Diabetes screening. This is done by checking your blood sugar (glucose) after you have not eaten for a while (fasting). You may have this done every 1-3 years. Mammogram. This may be done every 1-2 years. Talk to your health care provider about when you should start having regular mammograms. This may depend on whether you have a family history  of breast cancer. BRCA-related cancer screening. This may be done if you have a family history of breast, ovarian, tubal, or peritoneal cancers. Pelvic exam and Pap test. This may be done every 3 years starting at age 51. Starting at age 46, this may be done every 5 years if you have a Pap test in combination with an HPV test. Bone density scan. This is done to screen for osteoporosis. You may have  this scan if you are at high risk for osteoporosis. Discuss your test results, treatment options, and if necessary, the need for more tests with your health care provider. Vaccines  Your health care provider may recommend certain vaccines, such as: Influenza vaccine. This is recommended every year. Tetanus, diphtheria, and acellular pertussis (Tdap, Td) vaccine. You may need a Td booster every 10 years. Zoster vaccine. You may need this after age 80. Pneumococcal 13-valent conjugate (PCV13) vaccine. You may need this if you have certain conditions and were not previously vaccinated. Pneumococcal polysaccharide (PPSV23) vaccine. You may need one or two doses if you smoke cigarettes or if you have certain conditions. Talk to your health care provider about which screenings and vaccines you need and how often you need them. This information is not intended to replace advice given to you by your health care provider. Make sure you discuss any questions you have with your health care provider. Document Released: 12/08/2015 Document Revised: 07/31/2016 Document Reviewed: 09/12/2015 Elsevier Interactive Patient Education  2017 [place] Prevention in the Home Falls can cause injuries. They can happen to people of all ages. There are many things you can do to make your home safe and to help prevent falls. What can I do on the outside of my home? Regularly fix the edges of walkways and driveways and fix any cracks. Remove anything that might make you trip as you walk through a door, such as a raised step or threshold. Trim any bushes or trees on the path to your home. Use bright outdoor lighting. Clear any walking paths of anything that might make someone trip, such as rocks or tools. Regularly check to see if handrails are loose or broken. Make sure that both sides of any steps have handrails. Any raised decks and porches should have guardrails on the edges. Have any leaves, snow, or  ice cleared regularly. Use sand or salt on walking paths during winter. Clean up any spills in your garage right away. This includes oil or grease spills. What can I do in the bathroom? Use night lights. Install grab bars by the toilet and in the tub and shower. Do not use towel bars as grab bars. Use non-skid mats or decals in the tub or shower. If you need to sit down in the shower, use a plastic, non-slip stool. Keep the floor dry. Clean up any water that spills on the floor as soon as it happens. Remove soap buildup in the tub or shower regularly. Attach bath mats securely with double-sided non-slip rug tape. Do not have throw rugs and other things on the floor that can make you trip. What can I do in the bedroom? Use night lights. Make sure that you have a light by your bed that is easy to reach. Do not use any sheets or blankets that are too big for your bed. They should not hang down onto the floor. Have a firm chair that has side arms. You can use this for support while you get dressed.  Do not have throw rugs and other things on the floor that can make you trip. What can I do in the kitchen? Clean up any spills right away. Avoid walking on wet floors. Keep items that you use a lot in easy-to-reach places. If you need to reach something above you, use a strong step stool that has a grab bar. Keep electrical cords out of the way. Do not use floor polish or wax that makes floors slippery. If you must use wax, use non-skid floor wax. Do not have throw rugs and other things on the floor that can make you trip. What can I do with my stairs? Do not leave any items on the stairs. Make sure that there are handrails on both sides of the stairs and use them. Fix handrails that are broken or loose. Make sure that handrails are as long as the stairways. Check any carpeting to make sure that it is firmly attached to the stairs. Fix any carpet that is loose or worn. Avoid having throw rugs at  the top or bottom of the stairs. If you do have throw rugs, attach them to the floor with carpet tape. Make sure that you have a light switch at the top of the stairs and the bottom of the stairs. If you do not have them, ask someone to add them for you. What else can I do to help prevent falls? Wear shoes that: Do not have high heels. Have rubber bottoms. Are comfortable and fit you well. Are closed at the toe. Do not wear sandals. If you use a stepladder: Make sure that it is fully opened. Do not climb a closed stepladder. Make sure that both sides of the stepladder are locked into place. Ask someone to hold it for you, if possible. Clearly mark and make sure that you can see: Any grab bars or handrails. First and last steps. Where the edge of each step is. Use tools that help you move around (mobility aids) if they are needed. These include: Canes. Walkers. Scooters. Crutches. Turn on the lights when you go into a dark area. Replace any light bulbs as soon as they burn out. Set up your furniture so you have a clear path. Avoid moving your furniture around. If any of your floors are uneven, fix them. If there are any pets around you, be aware of where they are. Review your medicines with your doctor. Some medicines can make you feel dizzy. This can increase your chance of falling. Ask your doctor what other things that you can do to help prevent falls. This information is not intended to replace advice given to you by your health care provider. Make sure you discuss any questions you have with your health care provider. Document Released: 09/07/2009 Document Revised: 04/18/2016 Document Reviewed: 12/16/2014 Elsevier Interactive Patient Education  2017 [place].  Opioid Pain Medicine Management Opioids are powerful medicines that are used to treat moderate to severe pain. When used for short periods of time, they can help you to: Sleep better. Do better in physical or  occupational therapy. Feel better in the first few days after an injury. Recover from surgery. Opioids should be taken with the supervision of a trained health care provider. They should be taken for the shortest period of time possible. This is because opioids can be addictive, and the longer you take opioids, the greater your risk of addiction. This addiction can also be called opioid use disorder. What are the risks? Using  opioid pain medicines for longer than 3 days increases your risk of side effects. Side effects include: Constipation. Nausea and vomiting. Breathing difficulties (respiratory depression). Drowsiness. Confusion. Opioid use disorder. Itching. Taking opioid pain medicine for a long period of time can affect your ability to do daily tasks. It also puts you at risk for: Motor vehicle crashes. Depression. Suicide. Heart attack. Overdose, which can be life-threatening. What is a pain treatment plan? A pain treatment plan is an agreement between you and your health care provider. Pain is unique to each person, and treatments vary depending on your condition. To manage your pain, you and your health care provider need to work together. To help you do this: Discuss the goals of your treatment, including how much pain you might expect to have and how you will manage the pain. Review the risks and benefits of taking opioid medicines. Remember that a good treatment plan uses more than one approach and minimizes the chance of side effects. Be honest about the amount of medicines you take and about any drug or alcohol use. Get pain medicine prescriptions from only one health care provider. Pain can be managed with many types of alternative treatments. Ask your health care provider to refer you to one or more specialists who can help you manage pain through: Physical or occupational therapy. Counseling (cognitive behavioral therapy). Good  nutrition. Biofeedback. Massage. Meditation. Non-opioid medicine. Following a gentle exercise program. How to use opioid pain medicine Taking medicine Take your pain medicine exactly as told by your health care provider. Take it only when you need it. If your pain gets less severe, you may take less than your prescribed dose if your health care provider approves. If you are not having pain, do nottake pain medicine unless your health care provider tells you to take it. If your pain is severe, do nottry to treat it yourself by taking more pills than instructed on your prescription. Contact your health care provider for help. Write down the times when you take your pain medicine. It is easy to become confused while on pain medicine. Writing the time can help you avoid overdose. Take other over-the-counter or prescription medicines only as told by your health care provider. Keeping yourself and others safe  While you are taking opioid pain medicine: Do not drive, use machinery, or power tools. Do not sign legal documents. Do not drink alcohol. Do not take sleeping pills. Do not supervise children by yourself. Do not do activities that require climbing or being in high places. Do not go to a lake, river, ocean, spa, or swimming pool. Do not share your pain medicine with anyone. Keep pain medicine in a locked cabinet or in a secure area where pets and children cannot reach it. Stopping your use of opioids If you have been taking opioid medicine for more than a few weeks, you may need to slowly decrease (taper) how much you take until you stop completely. Tapering your use of opioids can decrease your risk of symptoms of withdrawal, such as: Pain and cramping in the abdomen. Nausea. Sweating. Sleepiness. Restlessness. Uncontrollable shaking (tremors). Cravings for the medicine. Do not attempt to taper your use of opioids on your own. Talk with your health care provider about how to do  this. Your health care provider may prescribe a step-down schedule based on how much medicine you are taking and how long you have been taking it. Getting rid of leftover pills Do not save any leftover pills. Get  rid of leftover pills safely by: Taking the medicine to a prescription take-back program. This is usually offered by the county or law enforcement. Bringing them to a pharmacy that has a drug disposal container. Flushing them down the toilet. Check the label or package insert of your medicine to see whether this is safe to do. Throwing them out in the trash. Check the label or package insert of your medicine to see whether this is safe to do. If it is safe to throw it out, remove the medicine from the original container, put it into a sealable bag or container, and mix it with used coffee grounds, food scraps, dirt, or cat litter before putting it in the trash. Follow these instructions at home: Activity Do exercises as told by your health care provider. Avoid activities that make your pain worse. Return to your normal activities as told by your health care provider. Ask your health care provider what activities are safe for you. General instructions You may need to take these actions to prevent or treat constipation: Drink enough fluid to keep your urine pale yellow. Take over-the-counter or prescription medicines. Eat foods that are high in fiber, such as beans, whole grains, and fresh fruits and vegetables. Limit foods that are high in fat and processed sugars, such as fried or sweet foods. Keep all follow-up visits. This is important. Where to find support If you have been taking opioids for a long time, you may benefit from receiving support for quitting from a local support group or counselor. Ask your health care provider for a referral to these resources in your area. Where to find more information Centers for Disease Control and Prevention (CDC): http://www.wolf.info/ [place] Food and  Drug Administration (FDA): [place] Get help right away if: You may have taken too much of an opioid (overdosed). Common symptoms of an overdose: Your breathing is slower or more shallow than normal. You have a very slow heartbeat (pulse). You have slurred speech. You have nausea and vomiting. Your pupils become very small. You have other potential symptoms: You are very confused. You faint or feel like you will faint. You have cold, clammy skin. You have blue lips or fingernails. You have thoughts of harming yourself or harming others. These symptoms may represent a serious problem that is an emergency. Do not wait to see if the symptoms will go away. Get medical help right away. Call your local emergency services (911 in the [place] Do not drive yourself to the hospital.  If you ever feel like you may hurt yourself or others, or have thoughts about taking your own life, get help right away. Go to your nearest emergency department or: Call your local emergency services (911 in the [place] Call the [facility] 2496353219 in the [place] Call a suicide crisis helpline, such as the [facility] at 239-525-9838 or 988 in the [place]. This is open 24 hours a day in the [place] Text the Crisis Text Line at 587-057-2292 (in the [place].). Summary Opioid medicines can help you manage moderate to severe pain for a short period of time. A pain treatment plan is an agreement between you and your health care provider. Discuss the goals of your treatment, including how much pain you might expect to have and how you will manage the pain. If you think that you or someone else may have taken too much of an opioid, get medical help right away. This information is not intended to replace advice  given to you by your health care provider. Make sure you discuss any questions you have with your health care provider. Document Revised: 06/06/2021 Document Reviewed:  02/21/2021 Elsevier Patient Education  [place].

## 2022-01-10 NOTE — Telephone Encounter (Signed)
Prior authorization has been denied.

## 2022-01-11 ENCOUNTER — Telehealth: Payer: Self-pay

## 2022-01-11 NOTE — Telephone Encounter (Signed)
Sent pt mychart message

## 2022-01-11 NOTE — Telephone Encounter (Signed)
Sent pt mychart msg

## 2022-01-16 ENCOUNTER — Other Ambulatory Visit: Payer: Self-pay

## 2022-01-16 ENCOUNTER — Other Ambulatory Visit (INDEPENDENT_AMBULATORY_CARE_PROVIDER_SITE_OTHER): Payer: Medicare Other

## 2022-01-16 DIAGNOSIS — I10 Essential (primary) hypertension: Secondary | ICD-10-CM | POA: Diagnosis not present

## 2022-01-16 DIAGNOSIS — N1831 Chronic kidney disease, stage 3a: Secondary | ICD-10-CM

## 2022-01-16 DIAGNOSIS — R7303 Prediabetes: Secondary | ICD-10-CM | POA: Diagnosis not present

## 2022-01-16 DIAGNOSIS — E611 Iron deficiency: Secondary | ICD-10-CM | POA: Diagnosis not present

## 2022-01-16 DIAGNOSIS — Z1329 Encounter for screening for other suspected endocrine disorder: Secondary | ICD-10-CM | POA: Diagnosis not present

## 2022-01-16 DIAGNOSIS — E559 Vitamin D deficiency, unspecified: Secondary | ICD-10-CM | POA: Diagnosis not present

## 2022-01-16 DIAGNOSIS — Z1389 Encounter for screening for other disorder: Secondary | ICD-10-CM

## 2022-01-16 LAB — URINALYSIS, ROUTINE W REFLEX MICROSCOPIC
Bilirubin Urine: NEGATIVE
Hgb urine dipstick: NEGATIVE
Ketones, ur: NEGATIVE
Leukocytes,Ua: NEGATIVE
Nitrite: NEGATIVE
Specific Gravity, Urine: 1.025 (ref 1.000–1.030)
Total Protein, Urine: 30 — AB
Urine Glucose: NEGATIVE
Urobilinogen, UA: 0.2 (ref 0.0–1.0)
pH: 6 (ref 5.0–8.0)

## 2022-01-16 LAB — CBC WITH DIFFERENTIAL/PLATELET
Basophils Absolute: 0.1 10*3/uL (ref 0.0–0.1)
Basophils Relative: 1 % (ref 0.0–3.0)
Eosinophils Absolute: 0.2 10*3/uL (ref 0.0–0.7)
Eosinophils Relative: 4.6 % (ref 0.0–5.0)
HCT: 42 % (ref 36.0–46.0)
Hemoglobin: 13.8 g/dL (ref 12.0–15.0)
Lymphocytes Relative: 26.6 % (ref 12.0–46.0)
Lymphs Abs: 1.4 10*3/uL (ref 0.7–4.0)
MCHC: 32.9 g/dL (ref 30.0–36.0)
MCV: 86.7 fl (ref 78.0–100.0)
Monocytes Absolute: 0.4 10*3/uL (ref 0.1–1.0)
Monocytes Relative: 7.9 % (ref 3.0–12.0)
Neutro Abs: 3.2 10*3/uL (ref 1.4–7.7)
Neutrophils Relative %: 59.9 % (ref 43.0–77.0)
Platelets: 257 10*3/uL (ref 150.0–400.0)
RBC: 4.84 Mil/uL (ref 3.87–5.11)
RDW: 14.4 % (ref 11.5–15.5)
WBC: 5.3 10*3/uL (ref 4.0–10.5)

## 2022-01-16 LAB — COMPREHENSIVE METABOLIC PANEL
ALT: 26 U/L (ref 0–35)
AST: 20 U/L (ref 0–37)
Albumin: 4.1 g/dL (ref 3.5–5.2)
Alkaline Phosphatase: 127 U/L — ABNORMAL HIGH (ref 39–117)
BUN: 25 mg/dL — ABNORMAL HIGH (ref 6–23)
CO2: 34 mEq/L — ABNORMAL HIGH (ref 19–32)
Calcium: 9.8 mg/dL (ref 8.4–10.5)
Chloride: 101 mEq/L (ref 96–112)
Creatinine, Ser: 1.01 mg/dL (ref 0.40–1.20)
GFR: 63.48 mL/min (ref >60.00)
Glucose, Bld: 102 mg/dL — ABNORMAL HIGH (ref 70–99)
Potassium: 4 mEq/L (ref 3.5–5.1)
Sodium: 140 mEq/L (ref 135–145)
Total Bilirubin: 0.4 mg/dL (ref 0.2–1.2)
Total Protein: 6.8 g/dL (ref 6.0–8.3)

## 2022-01-16 LAB — LIPID PANEL
Cholesterol: 226 mg/dL — ABNORMAL HIGH (ref 0–200)
HDL: 60.7 mg/dL (ref >39.00)
LDL Cholesterol: 141 mg/dL — ABNORMAL HIGH (ref 0–99)
NonHDL: 164.8
Total CHOL/HDL Ratio: 4
Triglycerides: 121 mg/dL (ref 0.0–149.0)
VLDL: 24.2 mg/dL (ref 0.0–40.0)

## 2022-01-16 LAB — IBC + FERRITIN
Ferritin: 9.5 ng/mL — ABNORMAL LOW (ref 10.0–291.0)
Iron: 65 ug/dL (ref 42–145)
Saturation Ratios: 13.8 % — ABNORMAL LOW (ref 20.0–50.0)
TIBC: 471.8 ug/dL — ABNORMAL HIGH (ref 250.0–450.0)
Transferrin: 337 mg/dL (ref 212.0–360.0)

## 2022-01-16 LAB — VITAMIN D 25 HYDROXY (VIT D DEFICIENCY, FRACTURES): VITD: 55.18 ng/mL (ref 30.00–100.00)

## 2022-01-16 LAB — TSH: TSH: 3.22 u[IU]/mL (ref 0.35–5.50)

## 2022-01-16 LAB — MICROALBUMIN / CREATININE URINE RATIO
Creatinine,U: 98.5 mg/dL
Microalb Creat Ratio: 30.5 mg/g — ABNORMAL HIGH (ref 0.0–30.0)
Microalb, Ur: 30.1 mg/dL — ABNORMAL HIGH (ref 0.0–1.9)

## 2022-01-16 LAB — HEMOGLOBIN A1C: Hgb A1c MFr Bld: 5.7 % (ref 4.6–6.5)

## 2022-01-16 LAB — PHOSPHORUS: Phosphorus: 3 mg/dL (ref 2.3–4.6)

## 2022-01-18 LAB — PARATHYROID HORMONE, INTACT (NO CA): PTH: 64 pg/mL (ref 16–77)

## 2022-01-22 NOTE — Addendum Note (Signed)
Addended by: Orland Mustard on: 01/22/2022 04:52 PM   Modules accepted: Orders

## 2022-01-30 ENCOUNTER — Other Ambulatory Visit: Payer: Self-pay | Admitting: Internal Medicine

## 2022-01-30 DIAGNOSIS — G8929 Other chronic pain: Secondary | ICD-10-CM

## 2022-03-18 DIAGNOSIS — M542 Cervicalgia: Secondary | ICD-10-CM | POA: Diagnosis not present

## 2022-03-26 ENCOUNTER — Other Ambulatory Visit: Payer: Self-pay | Admitting: Internal Medicine

## 2022-03-26 DIAGNOSIS — G8929 Other chronic pain: Secondary | ICD-10-CM

## 2022-03-29 ENCOUNTER — Encounter: Payer: Self-pay | Admitting: Internal Medicine

## 2022-03-29 ENCOUNTER — Ambulatory Visit (INDEPENDENT_AMBULATORY_CARE_PROVIDER_SITE_OTHER): Payer: Medicare Other | Admitting: Internal Medicine

## 2022-03-29 VITALS — BP 124/84 | HR 76 | Temp 97.5°F | Resp 14 | Ht 64.0 in | Wt 363.4 lb

## 2022-03-29 DIAGNOSIS — E785 Hyperlipidemia, unspecified: Secondary | ICD-10-CM

## 2022-03-29 DIAGNOSIS — M25561 Pain in right knee: Secondary | ICD-10-CM

## 2022-03-29 DIAGNOSIS — G894 Chronic pain syndrome: Secondary | ICD-10-CM

## 2022-03-29 DIAGNOSIS — M546 Pain in thoracic spine: Secondary | ICD-10-CM

## 2022-03-29 DIAGNOSIS — I10 Essential (primary) hypertension: Secondary | ICD-10-CM

## 2022-03-29 DIAGNOSIS — M7661 Achilles tendinitis, right leg: Secondary | ICD-10-CM | POA: Diagnosis not present

## 2022-03-29 DIAGNOSIS — Z6841 Body Mass Index (BMI) 40.0 and over, adult: Secondary | ICD-10-CM

## 2022-03-29 DIAGNOSIS — R7303 Prediabetes: Secondary | ICD-10-CM | POA: Diagnosis not present

## 2022-03-29 DIAGNOSIS — M898X7 Other specified disorders of bone, ankle and foot: Secondary | ICD-10-CM

## 2022-03-29 DIAGNOSIS — M7662 Achilles tendinitis, left leg: Secondary | ICD-10-CM | POA: Diagnosis not present

## 2022-03-29 DIAGNOSIS — G8929 Other chronic pain: Secondary | ICD-10-CM | POA: Diagnosis not present

## 2022-03-29 HISTORY — DX: Essential (primary) hypertension: I10

## 2022-03-29 HISTORY — DX: Achilles tendinitis, right leg: M76.61

## 2022-03-29 HISTORY — DX: Body Mass Index (BMI) 40.0 and over, adult: Z684

## 2022-03-29 MED ORDER — WEGOVY 2.4 MG/0.75ML ~~LOC~~ SOAJ: 2.4000 mg | SUBCUTANEOUS | 5 refills | Status: DC

## 2022-03-29 MED ORDER — METHYLPREDNISOLONE 4 MG PO TBPK: ORAL_TABLET | ORAL | 0 refills | Status: DC

## 2022-03-29 MED ORDER — WEGOVY 0.5 MG/0.5ML ~~LOC~~ SOAJ: 0.5000 mg | SUBCUTANEOUS | 0 refills | Status: DC

## 2022-03-29 MED ORDER — WEGOVY 1.7 MG/0.75ML ~~LOC~~ SOAJ: 1.7000 mg | SUBCUTANEOUS | 0 refills | Status: DC

## 2022-03-29 MED ORDER — WEGOVY 1 MG/0.5ML ~~LOC~~ SOAJ: 1.0000 mg | SUBCUTANEOUS | 0 refills | Status: DC

## 2022-03-29 MED ORDER — WEGOVY 0.25 MG/0.5ML ~~LOC~~ SOAJ: 0.2500 mg | SUBCUTANEOUS | 0 refills | Status: DC

## 2022-03-29 MED ORDER — TRAMADOL HCL 50 MG PO TABS: ORAL_TABLET | ORAL | 0 refills | Status: DC
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
## 2022-03-29 NOTE — Patient Instructions (Addendum)
[facility]   ?Erika Hanson   ?[name], [place] [address]   ?[phone]   [name], DPM   ?[facility]   ?[place], [place] [address]   ?[phone]   ?[phone] (Fax)    ?Call ortho about right knee pain  ?Schedule appt podiatry as well  ?Mammogram due 04/11/22  ? ?IMPRESSION: ?1. Tricompartmental osteoarthritis, most pronounced in the lateral ?and patellofemoral compartments. ?2. Complex tearing/maceration of the anterior horn of the lateral ?meniscus. ?3. Findings suggestive of remote fibular collateral ligament injury. ?4. Moderate-sized knee joint effusion. ?  ?  ?Electronically Signed ?  By: [name] D.O. ?  On: 12/17/2020 13:11 ? ?Right knee MRI ?   ? ? ?Semaglutide Injection (Weight Management) ?What is this medication? ?SEMAGLUTIDE (SEM a GLOO tide) promotes weight loss. It may also be used to maintain weight loss. It works by decreasing appetite. Changes to diet and exercise are often combined with this medication. ?This medicine may be used for other purposes; ask your health care provider or pharmacist if you have questions. ?COMMON BRAND NAME(S): Wegovy ?What should I tell my care team before I take this medication? ?They need to know if you have any of these conditions: ?Endocrine tumors (MEN 2) or if someone in your family had these tumors ?Eye disease, vision problems ?Gallbladder disease ?History of depression or mental health disease ?History of pancreatitis ?Kidney disease ?Stomach or intestine problems ?Suicidal thoughts, plans, or attempt; a previous suicide attempt by you or a family member ?Thyroid cancer or if someone in your family had thyroid cancer ?An unusual or allergic reaction to semaglutide, other medications, foods, dyes, or preservatives ?Pregnant or trying to get pregnant ?Breast-feeding ?How should I use this medication? ?This medication is injected under the skin. You will be taught how to prepare and give it. Take it as directed on the  prescription label. It is given once every week (every 7 days). Keep taking it unless your care team tells you to stop. ?It is important that you put your used needles and pens in a special sharps container. Do not put them in a trash can. If you do not have a sharps container, call your pharmacist or care team to get one. ?A special MedGuide will be given to you by the pharmacist with each prescription and refill. Be sure to read this information carefully each time. ?This medication comes with INSTRUCTIONS FOR USE. Ask your pharmacist for directions on how to use this medication. Read the information carefully. Talk to your pharmacist or care team if you have questions. ?Talk to your care team about the use of this medication in children. While it may be prescribed for children as young as 12 years for selected conditions, precautions do apply. ?Overdosage: If you think you have taken too much of this medicine contact a poison control center or emergency room at once. ?NOTE: This medicine is only for you. Do not share this medicine with others. ?What if I miss a dose? ?If you miss a dose and the next scheduled dose is more than 2 days away, take the missed dose as soon as possible. If you miss a dose and the next scheduled dose is less than 2 days away, do not take the missed dose. Take the next dose at your regular time. Do not take double or extra doses. If you miss your dose for 2 weeks or more, take the next dose at your regular time or call your care  team to talk about how to restart this medication. ?What may interact with this medication? ?Insulin and other medications for diabetes ?This list may not describe all possible interactions. Give your health care provider a list of all the medicines, herbs, non-prescription drugs, or dietary supplements you use. Also tell them if you smoke, drink alcohol, or use illegal drugs. Some items may interact with your medicine. ?What should I watch for while using this  medication? ?Visit your care team for regular checks on your progress. It may be some time before you see the benefit from this medication. ?Drink plenty of fluids while taking this medication. Check with your care team if you have severe diarrhea, nausea, and vomiting, or if you sweat a lot. The loss of too much body fluid may make it dangerous for you to take this medication. ?This medication may affect blood sugar levels. Ask your care team if changes in diet or medications are needed if you have diabetes. ?If you or your family notice any changes in your behavior, such as new or worsening depression, thoughts of harming yourself, anxiety, other unusual or disturbing thoughts, or memory loss, call your care team right away. ?Women should inform their care team if they wish to become pregnant or think they might be pregnant. Losing weight while pregnant is not advised and may cause harm to the unborn child. Talk to your care team for more information. ?What side effects may I notice from receiving this medication? ?Side effects that you should report to your care team as soon as possible: ?Allergic reactions--skin rash, itching, hives, swelling of the face, lips, tongue, or throat ?Change in vision ?Dehydration--increased thirst, dry mouth, feeling faint or lightheaded, headache, dark yellow or brown urine ?Gallbladder problems--severe stomach pain, nausea, vomiting, fever ?Heart palpitations--rapid, pounding, or irregular heartbeat ?Kidney injury--decrease in the amount of urine, swelling of the ankles, hands, or feet ?Pancreatitis--severe stomach pain that spreads to your back or gets worse after eating or when touched, fever, nausea, vomiting ?Thoughts of suicide or self-harm, worsening mood, feelings of depression ?Thyroid cancer--new mass or lump in the neck, pain or trouble swallowing, trouble breathing, hoarseness ?Side effects that usually do not require medical attention (report to your care team if they  continue or are bothersome): ?Diarrhea ?Loss of appetite ?Nausea ?Stomach pain ?Vomiting ?This list may not describe all possible side effects. Call your doctor for medical advice about side effects. You may report side effects to FDA at 1-800-FDA-1088. ?Where should I keep my medication? ?Keep out of the reach of children and pets. ?Refrigeration (preferred): Store in the refrigerator. Do not freeze. Keep this medication in the original container until you are ready to take it. Get rid of any unused medication after the expiration date. ?Room temperature: If needed, prior to cap removal, the pen can be stored at room temperature for up to 28 days. Protect from light. If it is stored at room temperature, get rid of any unused medication after 28 days or after it expires, whichever is first. ?It is important to get rid of the medication as soon as you no longer need it or it is expired. You can do this in two ways: ?Take the medication to a medication take-back program. Check with your pharmacy or law enforcement to find a location. ?If you cannot return the medication, follow the directions in the [place]. ?NOTE: This sheet is a summary. It may not cover all possible information. If you have questions about this medicine, talk  to your doctor, pharmacist, or health care provider. ?? 2023 Elsevier/Gold Standard (2021-11-28 00:00:00) ? ?

## 2022-04-01 ENCOUNTER — Telehealth: Payer: Self-pay

## 2022-04-01 NOTE — Telephone Encounter (Signed)
Prior authorization has been started via covermymeds for pt's Semaglutide-Weight Management (WEGOVY) 0.25 MG/0.5ML SOAJ on 04/01/22 ? ?Key: [id number] ?Rx #: [id number] ? ?Awaiting denial or approval. ?

## 2022-04-02 ENCOUNTER — Encounter: Payer: Self-pay | Admitting: Internal Medicine

## 2022-04-03 ENCOUNTER — Encounter: Payer: Self-pay | Admitting: Psychiatry

## 2022-04-03 ENCOUNTER — Ambulatory Visit (INDEPENDENT_AMBULATORY_CARE_PROVIDER_SITE_OTHER): Payer: Medicare Other | Admitting: Psychiatry

## 2022-04-03 VITALS — BP 163/95 | HR 76 | Temp 97.5°F | Wt 367.0 lb

## 2022-04-03 DIAGNOSIS — F4010 Social phobia, unspecified: Secondary | ICD-10-CM

## 2022-04-03 DIAGNOSIS — F331 Major depressive disorder, recurrent, moderate: Secondary | ICD-10-CM | POA: Diagnosis not present

## 2022-04-03 DIAGNOSIS — F410 Panic disorder [episodic paroxysmal anxiety] without agoraphobia: Secondary | ICD-10-CM

## 2022-04-03 DIAGNOSIS — F411 Generalized anxiety disorder: Secondary | ICD-10-CM

## 2022-04-03 DIAGNOSIS — F4310 Post-traumatic stress disorder, unspecified: Secondary | ICD-10-CM

## 2022-04-03 DIAGNOSIS — G4701 Insomnia due to medical condition: Secondary | ICD-10-CM

## 2022-04-03 MED ORDER — DULOXETINE HCL 60 MG PO CPEP: 60.0000 mg | ORAL_CAPSULE | Freq: Two times a day (BID) | ORAL | 2 refills | Status: DC
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
## 2022-04-10 MED ORDER — OZEMPIC (0.25 OR 0.5 MG/DOSE) 2 MG/3ML ~~LOC~~ SOPN: 0.2500 mg | PEN_INJECTOR | SUBCUTANEOUS | 1 refills | Status: DC

## 2022-04-10 NOTE — Addendum Note (Signed)
Addended by: Orland Mustard on: 04/10/2022 05:12 PM ? ? Modules accepted: Orders ? ?

## 2022-04-18 ENCOUNTER — Other Ambulatory Visit: Payer: Self-pay | Admitting: Internal Medicine

## 2022-04-18 DIAGNOSIS — I10 Essential (primary) hypertension: Secondary | ICD-10-CM

## 2022-04-18 DIAGNOSIS — R7303 Prediabetes: Secondary | ICD-10-CM

## 2022-04-18 DIAGNOSIS — Z6841 Body Mass Index (BMI) 40.0 and over, adult: Secondary | ICD-10-CM

## 2022-04-18 DIAGNOSIS — E785 Hyperlipidemia, unspecified: Secondary | ICD-10-CM

## 2022-04-25 DIAGNOSIS — D0439 Carcinoma in situ of skin of other parts of face: Secondary | ICD-10-CM | POA: Diagnosis not present

## 2022-04-25 DIAGNOSIS — C44319 Basal cell carcinoma of skin of other parts of face: Secondary | ICD-10-CM | POA: Diagnosis not present

## 2022-05-01 ENCOUNTER — Ambulatory Visit (INDEPENDENT_AMBULATORY_CARE_PROVIDER_SITE_OTHER): Payer: Medicare Other | Admitting: Internal Medicine

## 2022-05-01 ENCOUNTER — Encounter: Payer: Self-pay | Admitting: Internal Medicine

## 2022-05-01 VITALS — BP 118/70 | HR 75 | Temp 97.8°F | Resp 14 | Ht 64.0 in | Wt 352.0 lb

## 2022-05-01 DIAGNOSIS — I10 Essential (primary) hypertension: Secondary | ICD-10-CM

## 2022-05-01 DIAGNOSIS — Z6841 Body Mass Index (BMI) 40.0 and over, adult: Secondary | ICD-10-CM

## 2022-05-01 DIAGNOSIS — G8929 Other chronic pain: Secondary | ICD-10-CM | POA: Diagnosis not present

## 2022-05-01 DIAGNOSIS — M546 Pain in thoracic spine: Secondary | ICD-10-CM

## 2022-05-01 DIAGNOSIS — L4050 Arthropathic psoriasis, unspecified: Secondary | ICD-10-CM | POA: Diagnosis not present

## 2022-05-01 DIAGNOSIS — E785 Hyperlipidemia, unspecified: Secondary | ICD-10-CM | POA: Diagnosis not present

## 2022-05-01 DIAGNOSIS — Z124 Encounter for screening for malignant neoplasm of cervix: Secondary | ICD-10-CM | POA: Diagnosis not present

## 2022-05-01 DIAGNOSIS — G894 Chronic pain syndrome: Secondary | ICD-10-CM | POA: Diagnosis not present

## 2022-05-01 DIAGNOSIS — R7303 Prediabetes: Secondary | ICD-10-CM

## 2022-05-01 MED ORDER — TRAMADOL HCL 50 MG PO TABS: ORAL_TABLET | ORAL | 0 refills | Status: DC

## 2022-05-01 MED ORDER — OZEMPIC (0.25 OR 0.5 MG/DOSE) 2 MG/3ML ~~LOC~~ SOPN: 0.5000 | PEN_INJECTOR | SUBCUTANEOUS | 1 refills | Status: DC

## 2022-05-01 NOTE — Patient Instructions (Addendum)
Dr. Amil Amen  Phone Fax E-mail Address  [phone] Not available [place] [address]     Specialties     Rheumatology            Dr. Prentice Docker  Phone Fax E-mail Address  [phone] Not available [place]   [place] [address]     Specialties     Obstetrics and Gynecology        Dr. Radene [facility]o [facility] ortho  Phone Fax E-mail Address  [phone] Not available [address]   [place] [address]     Specialties     Orthopedic Surgery
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
## 2022-05-14 ENCOUNTER — Telehealth: Payer: Self-pay | Admitting: Internal Medicine

## 2022-05-14 NOTE — Telephone Encounter (Signed)
Ann from [facility] rheumatology called stating she need records related to referral-notes,labs and the reason for the referral

## 2022-05-17 ENCOUNTER — Other Ambulatory Visit: Payer: Self-pay | Admitting: Internal Medicine

## 2022-05-17 DIAGNOSIS — J329 Chronic sinusitis, unspecified: Secondary | ICD-10-CM

## 2022-05-22 ENCOUNTER — Ambulatory Visit (INDEPENDENT_AMBULATORY_CARE_PROVIDER_SITE_OTHER): Payer: Medicare Other | Admitting: Psychiatry

## 2022-05-22 ENCOUNTER — Encounter: Payer: Self-pay | Admitting: Psychiatry

## 2022-05-22 VITALS — BP 136/74 | HR 81 | Temp 97.9°F | Wt 345.4 lb

## 2022-05-22 DIAGNOSIS — F4310 Post-traumatic stress disorder, unspecified: Secondary | ICD-10-CM

## 2022-05-22 DIAGNOSIS — F331 Major depressive disorder, recurrent, moderate: Secondary | ICD-10-CM

## 2022-05-22 DIAGNOSIS — F4010 Social phobia, unspecified: Secondary | ICD-10-CM

## 2022-05-22 DIAGNOSIS — F410 Panic disorder [episodic paroxysmal anxiety] without agoraphobia: Secondary | ICD-10-CM

## 2022-05-22 DIAGNOSIS — F411 Generalized anxiety disorder: Secondary | ICD-10-CM

## 2022-05-22 DIAGNOSIS — G4701 Insomnia due to medical condition: Secondary | ICD-10-CM

## 2022-05-22 MED ORDER — RAMELTEON 8 MG PO TABS: 8.0000 mg | ORAL_TABLET | Freq: Every day | ORAL | 1 refills | Status: DC

## 2022-05-22 MED ORDER — LAMOTRIGINE 100 MG PO TABS: 50.0000 mg | ORAL_TABLET | Freq: Two times a day (BID) | ORAL | 0 refills | Status: DC
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
## 2022-05-22 NOTE — Patient Instructions (Signed)
Ramelteon Tablets What is this medication? RAMELTEON (ram EL tee on) treats insomnia. It helps you go to sleep faster. This medicine may be used for other purposes; ask your health care provider or pharmacist if you have questions. COMMON BRAND NAME(S): Rozerem What should I tell my care team before I take this medication? They need to know if you have any of these conditions: Liver disease Lung or breathing disease, such as asthma or COPD Mental health condition Substance use disorder Sleep apnea Suicidal thoughts, plans, or attempt by you or a family member An unusual or allergic reaction to ramelteon, other medications, foods, dyes, or preservatives Pregnant or trying to get pregnant Breast-feeding How should I use this medication? Take this medication by mouth with water. Take it as directed on the prescription label and only when you are ready for bed. Do not cut or break this medication. Swallow the tablets whole. Do not take it with or right after a meal. A special MedGuide will be given to you by the pharmacist with each prescription and refill. Be sure to read this information carefully each time. Talk to your care team about the use of this medication in children. It is not approved for use in children. Overdosage: If you think you have taken too much of this medicine contact a poison control center or emergency room at once. NOTE: This medicine is only for you. Do not share this medicine with others. What if I miss a dose? This does not apply. This medication should only be taken immediately before going to sleep. Do not take double or extra doses. What may interact with this medication? Do not take this medication with any of the following: Fluvoxamine Melatonin Tasimelteon Viloxazine This medication may also interact with the following: Alcohol Certain medications for fungal infections like ketoconazole, fluconazole, or  itraconazole Ciprofloxacin Donepezil Doxepin Other medications for sleep Rifampin This list may not describe all possible interactions. Give your health care provider a list of all the medicines, herbs, non-prescription drugs, or dietary supplements you use. Also tell them if you smoke, drink alcohol, or use illegal drugs. Some items may interact with your medicine. What should I watch for while using this medication? Visit your care team for regular checks on your progress. Tell your care team if your symptoms do not start to get better or if they get worse. Plan to go to bed and stay in bed for a full night (7 to 8 hours) after you take this medication. You may still be drowsy the morning after taking this medication. This medication may affect your coordination, reaction time, or judgment. Do not drive or operate machinery until you know how this medication affects you. Sit up or stand slowly to reduce the risk of dizzy or fainting spells. You may do unusual sleep behaviors or activities you do not remember the day after taking this medication. Activities include driving, making or eating food, talking on the phone, sexual activity, or sleep walking. Stop taking this medication and call your care team right away if you find out you have done activities like this. If you or your family notice any changes in your behavior, such as new or worsening depression, thoughts of harming yourself, anxiety, other unusual or disturbing thoughts, or memory loss, call your care team right away. After you stop taking this medication, you may have trouble falling asleep. This is called rebound insomnia. This problem usually goes away on its own after 1 or 2 nights. What  side effects may I notice from receiving this medication? Side effects that you should report to your care team as soon as possible: Allergic reactions or angioedema--skin rash, itching, hives, swelling of the face, lips, tongue, arms, or legs,  trouble swallowing or breathing High prolactin level--unexpected breast tissue growth, discharge from the nipple, change in sex drive or performance, irregular menstrual cycle Mood and behavior changes--anxiety, nervousness, confusion, hallucinations, irritability, hostility, thoughts of suicide or self-harm, worsening mood, feelings of depression Unusual sleep behaviors or activities you do not remember such as driving, eating, or sexual activity Side effects that usually do not require medical attention (report to your care team if they continue or are bothersome): Dizziness Drowsiness the day after use Fatigue This list may not describe all possible side effects. Call your doctor for medical advice about side effects. You may report side effects to FDA at 1-800-FDA-1088. Where should I keep my medication? Keep out of the reach of children and pets. Store at room temperature between 15 and 30 degrees C (59 and 86 degrees F). Protect from light and moisture. Keep the container tightly closed. Get rid of any unused medication after the expiration date. To get rid of medications that are no longer needed or have expired: Take the medication to a medication take-back program. Check with your pharmacy or law enforcement to find a location. If you cannot return the medication, check the label or package insert to see if the medication should be thrown out in the garbage or flushed down the toilet. If you are not sure, ask your care team. If it is safe to put it in the trash, take the medication out of the container. Mix the medication with cat litter, dirt, coffee grounds, or other unwanted substance. Seal the mixture in a bag or container. Put it in the trash. NOTE: This sheet is a summary. It may not cover all possible information. If you have questions about this medicine, talk to your doctor, pharmacist, or health care provider.  2023 Elsevier/Gold Standard (2021-06-18 00:00:00)

## 2022-06-05 DIAGNOSIS — C44329 Squamous cell carcinoma of skin of other parts of face: Secondary | ICD-10-CM | POA: Diagnosis not present

## 2022-06-18 ENCOUNTER — Encounter: Payer: Self-pay | Admitting: Internal Medicine

## 2022-06-27 ENCOUNTER — Ambulatory Visit (INDEPENDENT_AMBULATORY_CARE_PROVIDER_SITE_OTHER): Payer: Medicare Other | Admitting: Internal Medicine

## 2022-06-27 ENCOUNTER — Encounter: Payer: Self-pay | Admitting: Internal Medicine

## 2022-06-27 VITALS — BP 140/70 | HR 78 | Temp 98.0°F | Ht 64.0 in | Wt 335.0 lb

## 2022-06-27 DIAGNOSIS — I10 Essential (primary) hypertension: Secondary | ICD-10-CM | POA: Diagnosis not present

## 2022-06-27 DIAGNOSIS — Z6841 Body Mass Index (BMI) 40.0 and over, adult: Secondary | ICD-10-CM | POA: Diagnosis not present

## 2022-06-27 DIAGNOSIS — M1711 Unilateral primary osteoarthritis, right knee: Secondary | ICD-10-CM | POA: Diagnosis not present

## 2022-06-27 DIAGNOSIS — M25561 Pain in right knee: Secondary | ICD-10-CM | POA: Diagnosis not present

## 2022-06-27 DIAGNOSIS — R7303 Prediabetes: Secondary | ICD-10-CM | POA: Diagnosis not present

## 2022-06-27 DIAGNOSIS — S83281D Other tear of lateral meniscus, current injury, right knee, subsequent encounter: Secondary | ICD-10-CM

## 2022-06-27 DIAGNOSIS — E785 Hyperlipidemia, unspecified: Secondary | ICD-10-CM

## 2022-06-27 DIAGNOSIS — M546 Pain in thoracic spine: Secondary | ICD-10-CM

## 2022-06-27 DIAGNOSIS — G894 Chronic pain syndrome: Secondary | ICD-10-CM | POA: Diagnosis not present

## 2022-06-27 DIAGNOSIS — G8929 Other chronic pain: Secondary | ICD-10-CM | POA: Diagnosis not present

## 2022-06-27 DIAGNOSIS — S83104D Unspecified dislocation of right knee, subsequent encounter: Secondary | ICD-10-CM | POA: Diagnosis not present

## 2022-06-27 HISTORY — DX: Unilateral primary osteoarthritis, right knee: M17.11

## 2022-06-27 MED ORDER — OXYCODONE-ACETAMINOPHEN 5-325 MG PO TABS: 1.0000 | ORAL_TABLET | Freq: Three times a day (TID) | ORAL | 0 refills | Status: DC | PRN

## 2022-06-27 MED ORDER — SEMAGLUTIDE (1 MG/DOSE) 4 MG/3ML ~~LOC~~ SOPN: 1.0000 mg | PEN_INJECTOR | SUBCUTANEOUS | 2 refills | Status: DC
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
## 2022-06-27 NOTE — Patient Instructions (Addendum)
Dr. Volanda Napoleon   Dr. Amil Amen can all daily for cancellations 5.0 4 Google reviews Rheumatologist in [place], [place] Address: [address], [place], [place] [address] Hours:  Open ? Closes 4:30?PM Phone: [phone]  [place] with [facility] [employer]

## 2022-07-01 ENCOUNTER — Other Ambulatory Visit: Payer: Self-pay | Admitting: Internal Medicine

## 2022-07-01 DIAGNOSIS — R7303 Prediabetes: Secondary | ICD-10-CM

## 2022-07-01 DIAGNOSIS — Z6841 Body Mass Index (BMI) 40.0 and over, adult: Secondary | ICD-10-CM

## 2022-07-01 DIAGNOSIS — E785 Hyperlipidemia, unspecified: Secondary | ICD-10-CM

## 2022-07-01 DIAGNOSIS — I10 Essential (primary) hypertension: Secondary | ICD-10-CM

## 2022-07-02 ENCOUNTER — Telehealth: Payer: Self-pay | Admitting: Internal Medicine

## 2022-07-02 NOTE — Telephone Encounter (Signed)
Lft pt vm to call ofc . thanks 

## 2022-07-03 ENCOUNTER — Ambulatory Visit (INDEPENDENT_AMBULATORY_CARE_PROVIDER_SITE_OTHER): Payer: Medicare Other | Admitting: Psychiatry

## 2022-07-03 ENCOUNTER — Encounter: Payer: Self-pay | Admitting: Psychiatry

## 2022-07-03 VITALS — BP 137/82 | HR 92 | Temp 97.8°F | Wt 331.8 lb

## 2022-07-03 DIAGNOSIS — F4010 Social phobia, unspecified: Secondary | ICD-10-CM

## 2022-07-03 DIAGNOSIS — F411 Generalized anxiety disorder: Secondary | ICD-10-CM

## 2022-07-03 DIAGNOSIS — F3342 Major depressive disorder, recurrent, in full remission: Secondary | ICD-10-CM

## 2022-07-03 DIAGNOSIS — F4310 Post-traumatic stress disorder, unspecified: Secondary | ICD-10-CM | POA: Diagnosis not present

## 2022-07-03 DIAGNOSIS — F331 Major depressive disorder, recurrent, moderate: Secondary | ICD-10-CM

## 2022-07-03 DIAGNOSIS — G4701 Insomnia due to medical condition: Secondary | ICD-10-CM

## 2022-07-03 DIAGNOSIS — F410 Panic disorder [episodic paroxysmal anxiety] without agoraphobia: Secondary | ICD-10-CM

## 2022-07-03 MED ORDER — DULOXETINE HCL 60 MG PO CPEP: 60.0000 mg | ORAL_CAPSULE | Freq: Every day | ORAL | 0 refills | Status: DC

## 2022-07-03 MED ORDER — SERTRALINE HCL 25 MG PO TABS: 25.0000 mg | ORAL_TABLET | Freq: Every day | ORAL | 1 refills | Status: DC

## 2022-07-03 MED ORDER — DULOXETINE HCL 20 MG PO CPEP: 20.0000 mg | ORAL_CAPSULE | Freq: Every day | ORAL | 0 refills | Status: DC

## 2022-07-03 MED ORDER — BUPROPION HCL ER (SR) 100 MG PO TB12: 100.0000 mg | ORAL_TABLET | Freq: Two times a day (BID) | ORAL | 1 refills | Status: DC

## 2022-07-03 MED ORDER — RAMELTEON 8 MG PO TABS: 8.0000 mg | ORAL_TABLET | Freq: Every day | ORAL | 1 refills | Status: DC

## 2022-07-03 NOTE — Patient Instructions (Signed)
Sertraline Tablets What is this medication? SERTRALINE (SER tra leen) treats depression, anxiety, obsessive-compulsive disorder (OCD), post-traumatic stress disorder (PTSD), and premenstrual dysphoric disorder (PMDD). It increases the amount of serotonin in the brain, a hormone that helps regulate mood. It belongs to a group of medications called SSRIs. This medicine may be used for other purposes; ask your health care provider or pharmacist if you have questions. COMMON BRAND NAME(S): Zoloft What should I tell my care team before I take this medication? They need to know if you have any of these conditions: Bleeding disorders Bipolar disorder or a family history of bipolar disorder Frequently drink alcohol Glaucoma Heart disease High blood pressure History of irregular heartbeat History of low levels of calcium, magnesium, or potassium in the blood Liver disease Receiving electroconvulsive therapy Seizures Suicidal thoughts, plans, or attempt; a previous suicide attempt by you or a family member Take medications that prevent or treat blood clots Thyroid disease An unusual or allergic reaction to sertraline, other medications, foods, dyes, or preservatives Pregnant or trying to get pregnant Breast-feeding How should I use this medication? Take this medication by mouth with a glass of water. Follow the directions on the prescription label. You can take it with or without food. Take your medication at regular intervals. Do not take your medication more often than directed. Do not stop taking this medication suddenly except upon the advice of your care team. Stopping this medication too quickly may cause serious side effects or your condition may worsen. A special MedGuide will be given to you by the pharmacist with each prescription and refill. Be sure to read this information carefully each time. Talk to your care team about the use of this medication in children. While this medication may  be prescribed for children as young as 7 years for selected conditions, precautions do apply. Overdosage: If you think you have taken too much of this medicine contact a poison control center or emergency room at once. NOTE: This medicine is only for you. Do not share this medicine with others. What if I miss a dose? If you miss a dose, take it as soon as you can. If it is almost time for your next dose, take only that dose. Do not take double or extra doses. What may interact with this medication? Do not take this medication with any of the following: Cisapride Dronedarone Linezolid MAOIs like Carbex, Eldepryl, Marplan, Nardil, and Parnate Methylene blue (injected into a vein) Pimozide Thioridazine This medication may also interact with the following: Alcohol Amphetamines Aspirin and aspirin-like medications Certain medications for depression, anxiety, or other mental health conditions Certain medications for fungal infections like ketoconazole, fluconazole, posaconazole, and itraconazole Certain medications for irregular heart beat like flecainide, quinidine, propafenone Certain medications for migraine headaches like almotriptan, eletriptan, frovatriptan, naratriptan, rizatriptan, sumatriptan, zolmitriptan Certain medications for sleep Certain medications for seizures like carbamazepine, valproic acid, phenytoin Certain medications that treat or prevent blood clots like warfarin, enoxaparin, dalteparin Cimetidine Digoxin Diuretics Fentanyl Isoniazid Lithium NSAIDs, medications for pain and inflammation, like ibuprofen or naproxen Other medications that prolong the QT interval (cause an abnormal heart rhythm) like dofetilide Rasagiline Safinamide Supplements like St. John's wort, kava kava, valerian Tolbutamide Tramadol Tryptophan This list may not describe all possible interactions. Give your health care provider a list of all the medicines, herbs, non-prescription drugs, or  dietary supplements you use. Also tell them if you smoke, drink alcohol, or use illegal drugs. Some items may interact with your medicine. What should  I watch for while using this medication? Tell your care team if your symptoms do not get better or if they get worse. Visit your care team for regular checks on your progress. Because it may take several weeks to see the full effects of this medication, it is important to continue your treatment as prescribed by your care team. Patients and their families should watch out for new or worsening thoughts of suicide or depression. Also watch out for sudden changes in feelings such as feeling anxious, agitated, panicky, irritable, hostile, aggressive, impulsive, severely restless, overly excited and hyperactive, or not being able to sleep. If this happens, especially at the beginning of treatment or after a change in dose, call your care team. This medication may affect your coordination, reaction time, or judgment. Do not drive or operate machinery until you know how this medication affects you. Sit or stand up slowly to reduce the risk of dizzy or fainting spells. Drinking alcohol with this medication can increase the risk of these side effects. Your mouth may get dry. Chewing sugarless gum or sucking hard candy, and drinking plenty of water may help. Contact your care team if the problem does not go away or is severe. What side effects may I notice from receiving this medication? Side effects that you should report to your care team as soon as possible: Allergic reactions--skin rash, itching, hives, swelling of the face, lips, tongue, or throat Bleeding--bloody or black, tar-like stools, red or dark brown urine, vomiting blood or brown material that looks like coffee grounds, small red or purple spots on skin, unusual bleeding or bruising Heart rhythm changes--fast or irregular heartbeat, dizziness, feeling faint or lightheaded, chest pain, trouble  breathing Low sodium level--muscle weakness, fatigue, dizziness, headache, confusion Serotonin syndrome--irritability, confusion, fast or irregular heartbeat, muscle stiffness, twitching muscles, sweating, high fever, seizure, chills, vomiting, diarrhea Sudden eye pain or change in vision such as blurred vision, seeing halos around lights, vision loss Thoughts of suicide or self-harm, worsening mood Side effects that usually do not require medical attention (report these to your care team if they continue or are bothersome): Change in sex drive or performance Diarrhea Excessive sweating Nausea Tremors or shaking Upset stomach This list may not describe all possible side effects. Call your doctor for medical advice about side effects. You may report side effects to FDA at 1-800-FDA-1088. Where should I keep my medication? Keep out of the reach of children and pets. Store at room temperature between 15 and 30 degrees C (59 and 86 degrees F). Get rid of any unused medication after the expiration date. To get rid of medications that are no longer needed or expired: Take the medication to a medication take-back program. Check with your pharmacy or law enforcement to find a location. If you cannot return the medication, check the label or package insert to see if the medication should be thrown out in the garbage or flushed down the toilet. If you are not sure, ask your care team. If it is safe to put in the trash, empty the medication out of the container. Mix the medication with cat litter, dirt, coffee grounds, or other unwanted substance. Seal the mixture in a bag or container. Put it in the trash. NOTE: This sheet is a summary. It may not cover all possible information. If you have questions about this medicine, talk to your doctor, pharmacist, or health care provider.  2023 Elsevier/Gold Standard (2008-01-02 00:00:00)
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
## 2022-07-10 ENCOUNTER — Ambulatory Visit
Admission: RE | Admit: 2022-07-10 | Discharge: 2022-07-10 | Disposition: A | Discharge status: Home / Self Care | Payer: Medicare Other | Source: Ambulatory Visit | Attending: Internal Medicine | Admitting: Internal Medicine

## 2022-07-10 DIAGNOSIS — G8929 Other chronic pain: Secondary | ICD-10-CM | POA: Diagnosis not present

## 2022-07-10 DIAGNOSIS — M1711 Unilateral primary osteoarthritis, right knee: Secondary | ICD-10-CM | POA: Diagnosis not present

## 2022-07-10 DIAGNOSIS — M25561 Pain in right knee: Secondary | ICD-10-CM | POA: Insufficient documentation

## 2022-07-10 DIAGNOSIS — S83104D Unspecified dislocation of right knee, subsequent encounter: Secondary | ICD-10-CM | POA: Insufficient documentation

## 2022-07-10 DIAGNOSIS — S83231A Complex tear of medial meniscus, current injury, right knee, initial encounter: Secondary | ICD-10-CM | POA: Diagnosis not present

## 2022-07-10 DIAGNOSIS — Q686 Discoid meniscus: Secondary | ICD-10-CM | POA: Diagnosis not present

## 2022-07-10 DIAGNOSIS — S83281D Other tear of lateral meniscus, current injury, right knee, subsequent encounter: Secondary | ICD-10-CM | POA: Diagnosis not present

## 2022-07-15 ENCOUNTER — Telehealth: Payer: Self-pay

## 2022-07-15 NOTE — Telephone Encounter (Signed)
Called Pt to notify about imaging results LMOM to CB:   McLean-Scocuzza, [name], MD  Dorna Leitz, MD Cc: Gracy Racer, CMA Moderate to severe tricompartmental arthritis right knee progressed since prior MRI and new complex tearing of medial meniscal body and posterior horm  No evidence of recurrent tear from prior surgery  F/u with ortho have pt call for appt she is established with Dr. Dorna Leitz [facility] in [place]   She will also need to get a disc/CD of this MRI to take to the appt from the facility where she had the MRI   Phone Fax E-mail Address  [phone] Not available [place] [address]   Specialties  Orthopedic Surgery

## 2022-07-15 NOTE — Telephone Encounter (Signed)
Patient states she is returning our call for her results.  I read Gracy Racer, CMA's note to patient.  Patient states she did see this message on MyChart and has scheduled an appointment with Dr. Dorna Leitz' PA.  Patient states Dr. Berenice Primas' office told her that he will be out for a while.  Patient states she would like to know if she should keep her appointment with Dr. Berenice Primas' PA, or should she schedule an appointment with another doctor.

## 2022-07-15 NOTE — Telephone Encounter (Signed)
She can see the PA or also request to see Dr. Melrose Nakayama who treats knee conditions as well  She can call and request change to MD since she is established

## 2022-07-16 ENCOUNTER — Telehealth: Payer: Self-pay

## 2022-07-16 NOTE — Telephone Encounter (Signed)
Pt was notified about Dr. Audrie Gallus advice:    McLean-Scocuzza, [name], MD  You 18 hours ago (6:51 PM)   TM She can see the PA or also request to see Dr. Melrose Nakayama who treats knee conditions as well  She can call and request change to MD since she is established         Note    You  McLean-Scocuzza, [name], MD 23 hours ago (1:14 PM)    Already documented this on results, Pt is wondering if she should book an appt with another Dr. Jaymes Graff wait for Dr. Berenice Primas.    Walker Shadow P routed conversation to [employer] (9:25 AM)   Cyndi Lennert Yesterday (9:25 AM)    Patient states she is returning our call for her results.  I read Gracy Racer, CMA's note to patient.  Patient states she did see this message on MyChart and has scheduled an appointment with Dr. Dorna Leitz' PA.  Patient states Dr. Berenice Primas' office told her that he will be out for a while.  Patient states she would like to know if she should keep her appointment with Dr. Berenice Primas' PA, or should she schedule an appointment with another doctor.      Note

## 2022-07-16 NOTE — Telephone Encounter (Signed)
Pt.notified

## 2022-07-24 DIAGNOSIS — M1711 Unilateral primary osteoarthritis, right knee: Secondary | ICD-10-CM | POA: Diagnosis not present

## 2022-07-24 DIAGNOSIS — S83241A Other tear of medial meniscus, current injury, right knee, initial encounter: Secondary | ICD-10-CM | POA: Diagnosis not present

## 2022-08-01 ENCOUNTER — Ambulatory Visit: Payer: Medicare Other | Admitting: Internal Medicine

## 2022-08-12 ENCOUNTER — Other Ambulatory Visit: Payer: Self-pay | Admitting: Internal Medicine

## 2022-08-12 DIAGNOSIS — G8929 Other chronic pain: Secondary | ICD-10-CM

## 2022-08-12 DIAGNOSIS — G894 Chronic pain syndrome: Secondary | ICD-10-CM

## 2022-08-13 DIAGNOSIS — M25561 Pain in right knee: Secondary | ICD-10-CM | POA: Diagnosis not present

## 2022-08-14 ENCOUNTER — Ambulatory Visit: Payer: Medicare Other | Admitting: Psychiatry

## 2022-08-16 ENCOUNTER — Other Ambulatory Visit: Payer: Self-pay | Admitting: Psychiatry

## 2022-08-16 DIAGNOSIS — F331 Major depressive disorder, recurrent, moderate: Secondary | ICD-10-CM

## 2022-08-16 DIAGNOSIS — F4310 Post-traumatic stress disorder, unspecified: Secondary | ICD-10-CM

## 2022-08-16 DIAGNOSIS — F411 Generalized anxiety disorder: Secondary | ICD-10-CM

## 2022-08-27 ENCOUNTER — Other Ambulatory Visit: Payer: Self-pay | Admitting: Psychiatry

## 2022-08-27 DIAGNOSIS — F4310 Post-traumatic stress disorder, unspecified: Secondary | ICD-10-CM

## 2022-08-27 DIAGNOSIS — F3342 Major depressive disorder, recurrent, in full remission: Secondary | ICD-10-CM

## 2022-08-27 DIAGNOSIS — F411 Generalized anxiety disorder: Secondary | ICD-10-CM

## 2022-08-29 ENCOUNTER — Ambulatory Visit (INDEPENDENT_AMBULATORY_CARE_PROVIDER_SITE_OTHER): Payer: Medicare Other | Admitting: Internal Medicine

## 2022-08-29 ENCOUNTER — Encounter: Payer: Self-pay | Admitting: Internal Medicine

## 2022-08-29 VITALS — BP 136/88 | HR 66 | Temp 97.6°F | Ht 64.0 in | Wt 325.0 lb

## 2022-08-29 DIAGNOSIS — M25551 Pain in right hip: Secondary | ICD-10-CM | POA: Diagnosis not present

## 2022-08-29 DIAGNOSIS — G8929 Other chronic pain: Secondary | ICD-10-CM | POA: Diagnosis not present

## 2022-08-29 DIAGNOSIS — M25552 Pain in left hip: Secondary | ICD-10-CM

## 2022-08-29 DIAGNOSIS — I10 Essential (primary) hypertension: Secondary | ICD-10-CM

## 2022-08-29 DIAGNOSIS — E785 Hyperlipidemia, unspecified: Secondary | ICD-10-CM | POA: Diagnosis not present

## 2022-08-29 DIAGNOSIS — R7303 Prediabetes: Secondary | ICD-10-CM | POA: Diagnosis not present

## 2022-08-29 DIAGNOSIS — K581 Irritable bowel syndrome with constipation: Secondary | ICD-10-CM | POA: Diagnosis not present

## 2022-08-29 DIAGNOSIS — M25561 Pain in right knee: Secondary | ICD-10-CM

## 2022-08-29 DIAGNOSIS — G894 Chronic pain syndrome: Secondary | ICD-10-CM | POA: Diagnosis not present

## 2022-08-29 DIAGNOSIS — K219 Gastro-esophageal reflux disease without esophagitis: Secondary | ICD-10-CM | POA: Diagnosis not present

## 2022-08-29 DIAGNOSIS — M546 Pain in thoracic spine: Secondary | ICD-10-CM | POA: Diagnosis not present

## 2022-08-29 DIAGNOSIS — Z6841 Body Mass Index (BMI) 40.0 and over, adult: Secondary | ICD-10-CM

## 2022-08-29 HISTORY — DX: Other chronic pain: G89.29

## 2022-08-29 MED ORDER — SEMAGLUTIDE (2 MG/DOSE) 8 MG/3ML ~~LOC~~ SOPN: 2.0000 mg | PEN_INJECTOR | SUBCUTANEOUS | 11 refills | Status: DC

## 2022-08-29 MED ORDER — LOSARTAN POTASSIUM 50 MG PO TABS: 50.0000 mg | ORAL_TABLET | Freq: Every day | ORAL | 3 refills | Status: DC

## 2022-08-29 MED ORDER — HYDROCHLOROTHIAZIDE 25 MG PO TABS: 25.0000 mg | ORAL_TABLET | Freq: Every day | ORAL | 3 refills | Status: DC

## 2022-08-29 MED ORDER — OXYCODONE-ACETAMINOPHEN 5-325 MG PO TABS: ORAL_TABLET | ORAL | 0 refills | Status: DC

## 2022-08-29 MED ORDER — LINACLOTIDE 290 MCG PO CAPS: ORAL_CAPSULE | ORAL | 3 refills | Status: DC

## 2022-08-29 MED ORDER — OMEPRAZOLE 20 MG PO CPDR: 20.0000 mg | DELAYED_RELEASE_CAPSULE | Freq: Every day | ORAL | 3 refills | Status: DC
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
## 2022-08-29 NOTE — Patient Instructions (Signed)
Call [facility] clinic and schedule colonoscopy please   Phone Fax E-mail Address  [phone] Not available [place] [address]     Specialties     Gastroenterology

## 2022-09-17 ENCOUNTER — Encounter: Payer: Self-pay | Admitting: Family Medicine

## 2022-09-17 ENCOUNTER — Ambulatory Visit (INDEPENDENT_AMBULATORY_CARE_PROVIDER_SITE_OTHER): Payer: Medicare Other | Admitting: Family Medicine

## 2022-09-17 VITALS — BP 128/84 | HR 78 | Temp 97.9°F | Ht 64.0 in | Wt 322.0 lb

## 2022-09-17 DIAGNOSIS — R350 Frequency of micturition: Secondary | ICD-10-CM | POA: Diagnosis not present

## 2022-09-17 DIAGNOSIS — R399 Unspecified symptoms and signs involving the genitourinary system: Secondary | ICD-10-CM | POA: Diagnosis not present

## 2022-09-17 LAB — POCT URINALYSIS DIPSTICK
Bilirubin, UA: NEGATIVE
Blood, UA: NEGATIVE
Glucose, UA: NEGATIVE
Ketones, UA: NEGATIVE
Leukocytes, UA: NEGATIVE
Nitrite, UA: NEGATIVE
Protein, UA: POSITIVE — AB
Spec Grav, UA: 1.02 (ref 1.010–1.025)
Urobilinogen, UA: 0.2 E.U./dL
pH, UA: 5 (ref 5.0–8.0)

## 2022-09-17 MED ORDER — CEPHALEXIN 500 MG PO CAPS: 500.0000 mg | ORAL_CAPSULE | Freq: Four times a day (QID) | ORAL | 0 refills | Status: AC

## 2022-09-17 NOTE — Progress Notes (Signed)
    SUBJECTIVE:   CHIEF COMPLAINT / HPI: Kidney infection  Patient presents to clinic with symptoms of urinary infection for 3 weeks.  Endorses lower back pain that radiates to lower abdomen.  Pain is constant and dull.  Difficulty urinating, not emptying bladder.   Denies any fevers, hematuria, nausea/vomiting, weight loss, or decrease in appetite.  Has had similar symptoms in the past with urinary infections.  Not taking anything for discomfort.    PERTINENT  PMH / PSH:  Obesity class 3 CKD stage 3 HTN Radiculopathy DDD APKD   OBJECTIVE:   BP 128/84 (BP Location: Left Arm, Patient Position: Sitting, Cuff Size: Large)   Pulse 78   Temp 97.9 F (36.6 C) (Oral)   Ht '5\' 4"'$  (1.626 m)   Wt (!) 322 lb (146.1 kg)   LMP 05/21/2016   SpO2 99%   BMI 55.27 kg/m    General: Alert, no acute distress Cardio: Normal S1 and S2, RRR, no r/m/g Pulm: CTAB, normal work of breathing Abdomen: Bowel sounds normal. Abdomen soft and mild tenderness to light palpation over suprapubic area.  Cough test negative, Negative rebound tenderness, No CVA tenderness.  ASSESSMENT/PLAN:   Urinary frequency Increased urinary frequency, suprapubic pressure and dysuria likely symptomatic acute cystitis.  Low suspicion for pyelonephritis given no CVA tenderness or fevers.  -Urinalysis and urine culture -Keflex 500 mg QID x 5 days -Strict return precautions provided -Follow up with labs.     PDMP Reviewed  Carollee Leitz, MD

## 2022-09-17 NOTE — Patient Instructions (Addendum)
It was a pleasure meeting you today. Thank you for allowing me to take part in your health care.  Our goals for today as we discussed include:  For your urinary symptoms Start Keflex 500 mg four times a day, breakfast, lunch, dinner and before bed Take a probiotic daily and continue for at least 2 weeks after Your initial urine was negative.  Will send culture for further evaluation.  If negative will call to discontinue antibiotics. If positive may need to switch antibiotics pending coverage.  Start Miralax 1 scoop daily for 3-5 days Taking Ozempic and Percocet can increase constipation which may be contributing to lower abdominal pain.    If you develop any bloody stool, fevers or worsening pain please notify MD or go to Urgent Care/ Emergency   For your blood pressure Your initial blood pressure was elevated. Repeat was 128/84 Follow up with Nephrology for blood pressure control    Please schedule appointment for PAP  Mammogram is due   Please follow-up with PCP in 1 week  If you have any questions or concerns, please do not hesitate to call the office at [phone].  I look forward to our next visit and until then take care and stay safe.  Regards,   Carollee Leitz, MD   [facility]

## 2022-09-18 LAB — URINALYSIS, MICROSCOPIC ONLY

## 2022-09-19 DIAGNOSIS — R5383 Other fatigue: Secondary | ICD-10-CM | POA: Diagnosis not present

## 2022-09-19 DIAGNOSIS — M064 Inflammatory polyarthropathy: Secondary | ICD-10-CM | POA: Diagnosis not present

## 2022-09-19 DIAGNOSIS — M79671 Pain in right foot: Secondary | ICD-10-CM | POA: Diagnosis not present

## 2022-09-19 DIAGNOSIS — M79672 Pain in left foot: Secondary | ICD-10-CM | POA: Diagnosis not present

## 2022-09-19 LAB — URINE CULTURE
MICRO NUMBER:: 14099626
Result:: NO GROWTH
SPECIMEN QUALITY:: ADEQUATE

## 2022-09-23 ENCOUNTER — Other Ambulatory Visit: Payer: Self-pay | Admitting: Psychiatry

## 2022-09-23 DIAGNOSIS — G4701 Insomnia due to medical condition: Secondary | ICD-10-CM

## 2022-09-24 ENCOUNTER — Telehealth (INDEPENDENT_AMBULATORY_CARE_PROVIDER_SITE_OTHER): Payer: Medicare Other | Admitting: Family Medicine

## 2022-09-24 DIAGNOSIS — F4310 Post-traumatic stress disorder, unspecified: Secondary | ICD-10-CM

## 2022-09-24 DIAGNOSIS — F331 Major depressive disorder, recurrent, moderate: Secondary | ICD-10-CM | POA: Diagnosis not present

## 2022-09-24 DIAGNOSIS — F39 Unspecified mood [affective] disorder: Secondary | ICD-10-CM | POA: Diagnosis not present

## 2022-09-24 NOTE — Patient Instructions (Signed)
It was a pleasure meeting you today. Thank you for allowing me to take part in your health care.  Our goals for today as we discussed include:  For [place] [phone] Address: [place], [place] [address] Hours: Open 24/7, No appointment required.       Please follow-up with PCP in 2 weeks  If you have any questions or concerns, please do not hesitate to call the office at [phone].  I look forward to our next visit and until then take care and stay safe.  Regards,   Carollee Leitz, MD   [facility]

## 2022-09-24 NOTE — Progress Notes (Signed)
[facility] Telemedicine Visit  Patient consented to have virtual visit and was identified by name and date of birth. Method of visit: Video  Encounter participants: Patient: Erika Hanson - located at work Provider: Carollee Leitz - located at office Others (if applicable): none  Chief Complaint: Follow-up abdominal pain  HPI: Patient reports abdominal pain has resolved.  Today she is concerned for worsening mood and anxiety.  Follows with psychiatry, Dr. Lalla Brothers.  Reports that she has an appointment this upcoming Thursday with Friday.  Patient reports that she is currently Wellbutrin, Lamictal and Zoloft.  She reports that she was weaned from duloxetine and stated she feels that she is riding a roller coaster with her emotions.  She feels frustrated and angry.  Reports having issues at work with concentration along with increased stress at home.  Her landlord has asked her to find a new place to live when she has increasing her stress level.   ROS: per HPI  Pertinent PMHx:  Mood disorder GAD Chronic pain   Exam:  LMP 05/21/2016   Respiratory: Speaking in full sentences.  No increased work of breathing. Psych: Mood depressed, tearful at times.    10/01/2022    3:42 PM 09/24/2022    3:32 PM 09/17/2022    3:54 PM 09/17/2022    3:27 PM 07/03/2022    8:39 AM  Depression screen PHQ 2/9  Decreased Interest '3 3 1 1   '$ Down, Depressed, Hopeless '3 3 1 1   '$ PHQ - 2 Score '6 6 2 2   '$ Altered sleeping 3 3 0    Tired, decreased energy '3 3 1    '$ Change in appetite 3 0 0    Feeling bad or failure about yourself  3 3 0    Trouble concentrating 1 3 0    Moving slowly or fidgety/restless 3 0 0    Suicidal thoughts 3 0 0    PHQ-9 Score '25 18 3    '$ Difficult doing work/chores Very difficult Very difficult        Information is confidential and restricted. Go to Review Flowsheets to unlock data.        10/01/2022    3:43 PM 09/24/2022    3:33 PM 09/17/2022    3:55  PM 07/03/2022    8:39 AM  GAD 7 : Generalized Anxiety Score  Nervous, Anxious, on Edge '3 3 2   '$ Control/stop worrying '3 3 1   '$ Worry too much - different things '3 3 1   '$ Trouble relaxing '3 3 1   '$ Restless '3 3 1   '$ Easily annoyed or irritable '3 3 2   '$ Afraid - awful might happen '3 3 1   '$ Total GAD 7 Score '21 21 9   '$ Anxiety Difficulty Extremely difficult Very difficult Somewhat difficult      Information is confidential and restricted. Go to Review Flowsheets to unlock data.     Assessment/Plan:  Mood disorder (HCC) PHQ-9 and GAD scores elevated today from previous visits.  Denies any SI/HI.  Given that she currently has an upcoming visit have recommended that she follow-up with Dr. Shea Evans to discuss her concerns.  Given that she psychiatric medications I do not feel that it is appropriate for family practice to manage her medications at this time or make any changes. -Continue Wellbutrin 100 mg twice daily -Continue Lamictal 50 mg twice daily -Continue Zoloft 25 mg daily -Follow-up to be scheduled for Thursday -Follow-up with PCP in  2 week -Referral to psychology for CBT -Referral sent to psychiatry, Dr. Nicolasa Ducking, at patient's request for second opinion   PDMP reviewed

## 2022-09-25 ENCOUNTER — Ambulatory Visit
Admission: RE | Admit: 2022-09-25 | Discharge: 2022-09-25 | Disposition: A | Discharge status: Home / Self Care | Payer: Medicare Other | Source: Ambulatory Visit | Attending: Internal Medicine | Admitting: Internal Medicine

## 2022-09-25 ENCOUNTER — Telehealth: Payer: Self-pay | Admitting: Family Medicine

## 2022-09-25 DIAGNOSIS — Z1231 Encounter for screening mammogram for malignant neoplasm of breast: Secondary | ICD-10-CM | POA: Insufficient documentation

## 2022-09-25 NOTE — Telephone Encounter (Signed)
Patient returned referrals phone call.

## 2022-09-25 NOTE — Telephone Encounter (Signed)
Lft pt vm to call ofc . thanks 

## 2022-09-26 ENCOUNTER — Telehealth: Payer: Self-pay

## 2022-09-26 ENCOUNTER — Ambulatory Visit: Payer: Medicare Other | Admitting: Psychiatry

## 2022-09-26 NOTE — Telephone Encounter (Signed)
Patient is not doing good. She had an appointment today with Dr. Shea Evans and when she got there valet was backed up. Patient states she is handicapped and has to use valet so by the time that got her situated and she got in the office she was 5 minutes late and Dr. Shea Evans would not see her. I reminded Patient of [place] in [place] and told her that office is 24/7 if she needs someone. Patient stated she had just got off of the phone from scheduling an appointment with Dr Volanda Napoleon on 10/01/22. Patient promised me if she got worse she would call [facility].

## 2022-09-28 ENCOUNTER — Encounter: Payer: Self-pay | Admitting: Family Medicine

## 2022-09-28 DIAGNOSIS — R350 Frequency of micturition: Secondary | ICD-10-CM

## 2022-09-28 HISTORY — DX: Frequency of micturition: R35.0

## 2022-09-28 NOTE — Assessment & Plan Note (Signed)
Increased urinary frequency, suprapubic pressure and dysuria likely symptomatic acute cystitis.  Low suspicion for pyelonephritis given no CVA tenderness or fevers.  -Urinalysis and urine culture -Keflex 500 mg QID x 5 days -Strict return precautions provided -Follow up with labs.

## 2022-10-01 ENCOUNTER — Encounter: Payer: Self-pay | Admitting: Family Medicine

## 2022-10-01 ENCOUNTER — Ambulatory Visit (INDEPENDENT_AMBULATORY_CARE_PROVIDER_SITE_OTHER): Payer: Medicare Other | Admitting: Family Medicine

## 2022-10-01 VITALS — BP 122/76 | HR 73 | Temp 97.5°F | Ht 64.0 in | Wt 325.8 lb

## 2022-10-01 DIAGNOSIS — F331 Major depressive disorder, recurrent, moderate: Secondary | ICD-10-CM

## 2022-10-01 DIAGNOSIS — F411 Generalized anxiety disorder: Secondary | ICD-10-CM

## 2022-10-01 DIAGNOSIS — F4310 Post-traumatic stress disorder, unspecified: Secondary | ICD-10-CM

## 2022-10-01 MED ORDER — LAMOTRIGINE 100 MG PO TABS: 50.0000 mg | ORAL_TABLET | Freq: Every day | ORAL | 0 refills | Status: DC

## 2022-10-01 MED ORDER — FLUOXETINE HCL 10 MG PO CAPS: 10.0000 mg | ORAL_CAPSULE | Freq: Every day | ORAL | 0 refills | Status: DC

## 2022-10-01 NOTE — Patient Instructions (Addendum)
It was a pleasure meeting you today. Thank you for allowing me to take part in your health care.  Our goals for today as we discussed include:  For your Mood Again I recommend to continue Psychiatry referral.  Please check with your insurance to see who is in network to send orders. Decrease Lamictal to 50 mg daily Stop Zoloft 25 mg daily Start Prozac 10 mg daily Continue Wellbutrin 200 mg daily   For your blood pressure Continue current medication   Please follow-up with PCP in 1 week  If you have any questions or concerns, please do not hesitate to call the office at [phone].  I look forward to our next visit and until then take care and stay safe.  Regards,   Carollee Leitz, MD   [facility]

## 2022-10-01 NOTE — Progress Notes (Signed)
SUBJECTIVE:   CHIEF COMPLAINT / YHC:WCBJSE up mood  Presents for follow up for MDD . Virtual visit on 10/31, no changes to medications were made at that time.  Had recommended that patient follow up with Psychiatry as was scheduled on 11/02.  Since then patient reports no improvement in symptoms. Was not able to see Psychiatrist as she was a couple mins late for appointment.  She has decreased her Lamictal to 50 mg mg daily.  She reports that she continues to have increased stress at home.  Her landlord has asked her to move out but is still not quite sure if he is selling the place so has told her to wait to look for something. She does not have a good relationship with her family members, specifically her son whom has turned her back on his mother.  She reports that her grandmothers home is now being rented to someone else and her family was away of her situation but did not offer to help.    PERTINENT  PMH / PSH:  Anxiety/Depression PTST  OBJECTIVE:   BP 122/76 (BP Location: Left Arm, Patient Position: Sitting, Cuff Size: Large)   Pulse 73   Temp (!) 97.5 F (36.4 C) (Oral)   Ht '5\' 4"'$  (1.626 m)   Wt (!) 325 lb 12.8 oz (147.8 kg)   LMP 05/21/2016   SpO2 98%   BMI 55.92 kg/m    General: Alert, no acute distress Cardio: Normal S1 and S2, RRR, no r/m/g Pulm: CTAB, normal work of breathing Psych: depressed mood, makes good eye contact, tearful.      10/01/2022    3:42 PM 09/24/2022    3:32 PM 09/17/2022    3:54 PM 09/17/2022    3:27 PM 07/03/2022    8:39 AM  Depression screen PHQ 2/9  Decreased Interest '3 3 1 1   '$ Down, Depressed, Hopeless '3 3 1 1   '$ PHQ - 2 Score '6 6 2 2   '$ Altered sleeping 3 3 0    Tired, decreased energy '3 3 1    '$ Change in appetite 3 0 0    Feeling bad or failure about yourself  3 3 0    Trouble concentrating 1 3 0    Moving slowly or fidgety/restless 3 0 0    Suicidal thoughts 3 0 0    PHQ-9 Score '25 18 3    '$ Difficult doing work/chores Very difficult  Very difficult        Information is confidential and restricted. Go to Review Flowsheets to unlock data.        09/24/2022    3:33 PM 09/17/2022    3:55 PM  GAD 7 : Generalized Anxiety Score  Nervous, Anxious, on Edge  3 2  Control/stop worrying  3 1  Worry too much - different things  3 1  Trouble relaxing  3 1  Restless  3 1  Easily annoyed or irritable  3 2  Afraid - awful might happen  3 1  Total GAD 7 Score  21 9  Anxiety Difficulty  Very difficult Somewhat difficult       ASSESSMENT/PLAN:   MDD (major depressive disorder), recurrent episode, moderate (HCC) Depression screen and GAD increasing. Discussed with patient that she should be followed with psychiatry. Patient is aware that will likely need psychiatry to manage if not improving with changes. Has started to self discontinue Lamictal.  -Decrease Lamictal to 50 mg daily -Stop Zoloft -Start Prozac  10 mg daily -Continue Wellbutrin 200 mg daily -Recommend therapy to discuss depression and stress management -Mental health resources provided -Reached out to Dr Shea Evans for recommendations -Follow up with in 1 week with PCP -Strict return precautions provided    PDMP Reviewed  Carollee Leitz, MD

## 2022-10-01 NOTE — Assessment & Plan Note (Signed)
Depression screen and GAD increasing. Discussed with patient that she should be followed with psychiatry. Patient is aware that will likely need psychiatry to manage if not improving with changes. Has started to self discontinue Lamictal.  -Decrease Lamictal to 50 mg daily -Stop Zoloft -Start Prozac 10 mg daily -Continue Wellbutrin 200 mg daily -Recommend therapy to discuss depression and stress management -Mental health resources provided -Reached out to Dr Shea Evans for recommendations -Follow up with in 1 week with PCP -Strict return precautions provided

## 2022-10-02 ENCOUNTER — Telehealth: Payer: Self-pay | Admitting: Psychiatry

## 2022-10-02 ENCOUNTER — Ambulatory Visit: Payer: Medicare Other | Admitting: Psychiatry

## 2022-10-02 NOTE — Telephone Encounter (Signed)
Since patient is refusing treatment offered from [place] , will have Erika Hanson CMA officially send a letter with community resources for this patient .

## 2022-10-02 NOTE — Telephone Encounter (Signed)
Patient was on schedule for 09-26-22, valet caused her to be late. Provider could not see her for that reason, asked her to wait. Patient decided to reschedule.  Had opening on 10-02-22 following week. Office received voice message on 10-01-22 that she wanted to cancel her appointment for 10-02-22 a call back was done with message left to call and reschedule.  Provider received a message from primary care that she is not doing well, reached back out to patient and offered 10-04-22 appointment. Patient refused appointment stating that she would absolutely not come back to this office.

## 2022-10-07 DIAGNOSIS — M45A0 Non-radiographic axial spondyloarthritis of unspecified sites in spine: Secondary | ICD-10-CM | POA: Diagnosis not present

## 2022-10-08 ENCOUNTER — Encounter: Payer: Self-pay | Admitting: Family Medicine

## 2022-10-08 DIAGNOSIS — F39 Unspecified mood [affective] disorder: Secondary | ICD-10-CM | POA: Insufficient documentation

## 2022-10-08 NOTE — Assessment & Plan Note (Addendum)
PHQ-9 and GAD scores elevated today from previous visits.  Denies any SI/HI.  Given that she currently has an upcoming visit have recommended that she follow-up with Dr. Shea Evans to discuss her concerns.  Given that she psychiatric medications I do not feel that it is appropriate for family practice to manage her medications at this time or make any changes. -Continue Wellbutrin 100 mg twice daily -Continue Lamictal 50 mg twice daily -Continue Zoloft 25 mg daily -Follow-up to be scheduled for Thursday -Follow-up with PCP in 2 week -Referral to psychology for CBT -Referral sent to psychiatry, Dr. Nicolasa Ducking, at patient's request for second opinion

## 2022-10-09 ENCOUNTER — Encounter: Payer: Self-pay | Admitting: Family Medicine

## 2022-10-09 ENCOUNTER — Telehealth: Payer: Self-pay

## 2022-10-09 ENCOUNTER — Ambulatory Visit (INDEPENDENT_AMBULATORY_CARE_PROVIDER_SITE_OTHER): Payer: Medicare Other | Admitting: Family Medicine

## 2022-10-09 ENCOUNTER — Other Ambulatory Visit: Payer: Self-pay

## 2022-10-09 VITALS — BP 122/76 | HR 73 | Temp 97.7°F | Ht 64.0 in | Wt 323.8 lb

## 2022-10-09 DIAGNOSIS — F39 Unspecified mood [affective] disorder: Secondary | ICD-10-CM

## 2022-10-09 DIAGNOSIS — F411 Generalized anxiety disorder: Secondary | ICD-10-CM

## 2022-10-09 MED ORDER — BUPROPION HCL ER (SR) 100 MG PO TB12: 200.0000 mg | ORAL_TABLET | Freq: Two times a day (BID) | ORAL | Status: DC

## 2022-10-09 MED ORDER — BUPROPION HCL ER (SR) 100 MG PO TB12: 300.0000 mg | ORAL_TABLET | Freq: Every day | ORAL | 0 refills | Status: DC

## 2022-10-09 MED ORDER — BUPROPION HCL ER (SR) 100 MG PO TB12: 100.0000 mg | ORAL_TABLET | Freq: Every evening | ORAL | 0 refills | Status: DC

## 2022-10-09 MED ORDER — FLUOXETINE HCL 20 MG PO CAPS: 20.0000 mg | ORAL_CAPSULE | Freq: Every day | ORAL | 0 refills | Status: DC

## 2022-10-09 NOTE — Telephone Encounter (Signed)
New prescription sent in.

## 2022-10-09 NOTE — Progress Notes (Signed)
   Established Patient Office Visit  Subjective   Patient ID: Erika Hanson, female    DOB: [birth year]  Age: 56 y.o. MRN: [id number]  Chief Complaint  Patient presents with   Follow-up    Follow up- Mood    HPI    ROS    Objective:     LMP 05/21/2016    Physical Exam   No results found for any visits on 10/09/22.    The 10-year ASCVD risk score (Arnett DK, et al., 2019) is: 2.3%    Assessment & Plan:   Problem List Items Addressed This Visit   None   No follow-ups on file.    Suzanna Obey, [place]

## 2022-10-09 NOTE — Patient Instructions (Addendum)
It was a pleasure meeting you today. Thank you for allowing me to take part in your health care.  Our goals for today as we discussed include:  For your anxiety and depression Increase Prozac to 20 mg daily Wellbutrin 200 mg in the morning and 100 mg at night Monitor your blood pressure at home.  If greater than 160/100 please notify MD.   Continue to reach out to establish care with therapist to discuss stress management.   For [place] [phone] Address: [address], [place] [address] Hours: Open 24/7, No appointment required.    Please follow-up with PCP in 1 week  If you have any questions or concerns, please do not hesitate to call the office at [phone].  I look forward to our next visit and until then take care and stay safe.  Regards,   Carollee Leitz, MD   [facility]

## 2022-10-09 NOTE — Progress Notes (Signed)
SUBJECTIVE:   Chief Complaint  Patient presents with   Follow-up    Follow up- Mood   HPI Presents for follow up for mood disorder. Seen in clinic on 11/07 and adjustments made to psychiatry medications.  Zoloft was discontinued.  Prozac was initiated at 10 mg daily.  Continue to Wellbutrin at 200 mg daily.  Tolerating medications well.  Since then patient reports slight improvement in symptoms.  Increase stressors at home, possible eviction, relationship with family has not been good.  Was unable to see her psychiatrist after our last visit was.  Patient reports she was late getting to her appointment and was told that she was unable to be seen. Denies any SI/HI.   PERTINENT PMH / PSH: Anxiety/depression  OBJECTIVE:  BP 122/76 (BP Location: Left Arm, Patient Position: Sitting, Cuff Size: Normal)   Pulse 73   Temp 97.7 F (36.5 C) (Oral)   Ht '5\' 4"'$  (1.626 m)   Wt (!) 323 lb 12.8 oz (146.9 kg)   LMP 05/21/2016   SpO2 99%   BMI 55.58 kg/m    Physical Exam Vitals reviewed.  Constitutional:      General: She is not in acute distress.    Appearance: Normal appearance. She is obese. She is not ill-appearing, toxic-appearing or diaphoretic.  Eyes:     General:        Left eye: Discharge present.    Conjunctiva/sclera: Conjunctivae normal.  Cardiovascular:     Rate and Rhythm: Normal rate.  Pulmonary:     Effort: Pulmonary effort is normal.  Skin:    General: Skin is warm and dry.  Neurological:     Mental Status: She is alert and oriented to person, place, and time. Mental status is at baseline.  Psychiatric:        Mood and Affect: Mood normal.        Behavior: Behavior normal.        Thought Content: Thought content normal.        Judgment: Judgment normal.        10/09/2022   10:10 AM 10/09/2022    9:13 AM 10/01/2022    3:42 PM 09/24/2022    3:32 PM  Depression screen PHQ 2/9  Decreased Interest  '3 3 3 3  '$ Down, Depressed, Hopeless  '3 3 3 3  '$ PHQ - 2 Score  '6 6  6 6  '$ Altered sleeping  '3  3 3  '$ Tired, decreased energy  '3  3 3  '$ Change in appetite  1  3 0  Feeling bad or failure about yourself   '3  3 3  '$ Trouble concentrating  '3  1 3  '$ Moving slowly or fidgety/restless  0  3 0  Suicidal thoughts  1  3 0  PHQ-9 Score  '20  25 18  '$ Difficult doing work/chores  Extremely dIfficult  Very difficult Very difficult         10/09/2022   10:11 AM 10/01/2022    3:43 PM 09/24/2022    3:33 PM  GAD 7 : Generalized Anxiety Score  Nervous, Anxious, on Edge  '3 3 3  '$ Control/stop worrying  '3 3 3  '$ Worry too much - different things  '3 3 3  '$ Trouble relaxing  '3 3 3  '$ Restless  '3 3 3  '$ Easily annoyed or irritable  '3 3 3  '$ Afraid - awful might happen  '3 3 3  '$ Total GAD 7 Score  '21 21 21  '$ Anxiety Difficulty  Extremely difficult Extremely difficult Very difficult     ASSESSMENT/PLAN:  Mood disorder ([place]) Assessment & Plan: PHQ-9 slightly decreased while GAD remains unchanged since last visit.  Patient self discontinuing Lamictal, now at 50 mg daily. Increase Prozac to 20 mg daily Increase Wellbutrin 200 mg in the morning and 100 mg at night Monitor your blood pressure at home.  If greater than 160/100 please notify MD.   Continue to reach out to establish care with therapist to discuss stress management. Recommend following up with psychiatry for continued management. Recommend CBT    PDMP reviewed  Return in about 1 week (around 10/16/2022).  Carollee Leitz, MD

## 2022-10-09 NOTE — Telephone Encounter (Addendum)
Received call from [facility] in regards to pt's buPROPion ER [facility] SR) 100 MG 12 hr tablet they are in need of clarification on pt's sigs. There are 2 rx's that were sent today 10/09/22.  1st rx states: Take 2 tablets (200 mg total) by mouth 2 (two) times daily.  2nd rx states:Take 1 tablet (100 mg total) by mouth at bedtime.  Patient states: She is supposed to take 2 tabs in the morning & 1 at bedtime.   Pharmacy requesting callback......Marland Kitchen

## 2022-10-13 ENCOUNTER — Encounter: Payer: Self-pay | Admitting: Family Medicine

## 2022-10-16 ENCOUNTER — Other Ambulatory Visit: Payer: Self-pay | Admitting: Family Medicine

## 2022-10-16 ENCOUNTER — Ambulatory Visit: Payer: Medicare Other | Admitting: Family Medicine

## 2022-10-21 ENCOUNTER — Encounter: Payer: Self-pay | Admitting: Family Medicine

## 2022-10-21 ENCOUNTER — Telehealth (INDEPENDENT_AMBULATORY_CARE_PROVIDER_SITE_OTHER): Payer: Medicare Other | Admitting: Family Medicine

## 2022-10-21 DIAGNOSIS — F39 Unspecified mood [affective] disorder: Secondary | ICD-10-CM

## 2022-10-21 DIAGNOSIS — F411 Generalized anxiety disorder: Secondary | ICD-10-CM

## 2022-10-21 MED ORDER — FLUOXETINE HCL 20 MG PO CAPS: 20.0000 mg | ORAL_CAPSULE | Freq: Every day | ORAL | 1 refills | Status: DC

## 2022-10-21 MED ORDER — BUPROPION HCL ER (SR) 100 MG PO TB12: 300.0000 mg | ORAL_TABLET | Freq: Every day | ORAL | 1 refills | Status: DC

## 2022-10-21 NOTE — Patient Instructions (Signed)
It was a pleasure meeting you today. Thank you for allowing me to take part in your health care.  Our goals for today as we discussed include:  For your mood disorder Glad to hear your are symptoms are improving Continue Prozac 20 mg daily Continue Wellbutrin 300 mg daily Decrease Lamictal to 25 mg every other day until prescription has completed. Recommend therapy counseling for stress management  Congratulations on your new apartment  Your Pap smear is due.  Please schedule an appointment at your earliest convenience  If you have any questions or concerns, please do not hesitate to call the office at [phone].  I look forward to our next visit and until then take care and stay safe.  Regards,   Carollee Leitz, MD   [facility]

## 2022-10-21 NOTE — Progress Notes (Signed)
Virtual Visit via Video note  I connected with Erika Hanson on 11/02/22 at 1600 by video and verified that I am speaking with the correct person using two identifiers. Erika Hanson is currently located at work and is currently alone during visit. The provider, Carollee Leitz, MD is located in their office at time of visit.  I discussed the limitations, risks, security and privacy concerns of performing an evaluation and management service by video and the availability of in person appointments. I also discussed with the patient that there may be a patient responsible charge related to this service. The patient expressed understanding and agreed to proceed.  Subjective: PCP: Carollee Leitz, MD  Chief Complaint  Patient presents with   Follow-up    mood    HPI Presents for follow up for mood disorder. Seen in clinic on 11/15 and Prozac increased to 20 mg daily, increase Wellbutrin from 200 mg daily to 200 mg in a.m. and 100 mg in p.m.  Had been self discontinuing Lamictal.  Had recommended patient follow-up with psychiatry.  Since then patient reports significant improvement in symptoms.  Endorses improved mood and less frustration.  Tolerating increase in Prozac and Wellbutrin.  Also endorses that she is now found a new apartment which seems to have helped with her emotional state.  She reports having received a letter of dismissal from her current mental health provider.     ROS: Per HPI  Current Outpatient Medications:    Adalimumab-bwwd (HADLIMA PUSHTOUCH) 40 MG/0.8ML SOAJ, '40mg'$  Subcutaneous every 2 weeks for 28 days, Disp: , Rfl:    Biotin 10000 MCG TABS, Take 2 tablets by mouth., Disp: , Rfl:    fluticasone (FLONASE) 50 MCG/ACT nasal spray, PLACE 2 SPRAYS INTO BOTH NOSTRILS DAILY.USE AFTER NASAL SALINE, Disp: 16 g, Rfl: 11   hydrochlorothiazide (HYDRODIURIL) 25 MG tablet, Take 1 tablet (25 mg total) by mouth daily., Disp: 90 tablet, Rfl: 3   linaclotide (LINZESS) 290 MCG CAPS  capsule, TAKE 1 CAPSULE BY MOUTH ONCE DAILY BEFORE BREAKFAST, Disp: 90 capsule, Rfl: 3   losartan (COZAAR) 50 MG tablet, Take 1 tablet (50 mg total) by mouth daily. D/c 25 mg qd, Disp: 90 tablet, Rfl: 3   omeprazole (PRILOSEC) 20 MG capsule, Take 1 capsule (20 mg total) by mouth at bedtime., Disp: 90 capsule, Rfl: 3   ramelteon (ROZEREM) 8 MG tablet, TAKE 1 TABLET BY MOUTH EVERY NIGHT AT BEDTIME, Disp: 30 tablet, Rfl: 2   Semaglutide, 2 MG/DOSE, 8 MG/3ML SOPN, Inject 2 mg as directed once a week. D.c, Disp: 3 mL, Rfl: 11   tiZANidine (ZANAFLEX) 4 MG tablet, TAKE 1 TABLET BY MOUTH EVERY 8 HOURS AS NEEDED FOR MUSCLE SPASMS, Disp: 90 tablet, Rfl: 11   buPROPion ER (WELLBUTRIN SR) 100 MG 12 hr tablet, Take 3 tablets (300 mg total) by mouth daily. Take 2 tablets in the morning. Take 1 tablet  at night, Disp: 90 tablet, Rfl: 1   FLUoxetine (PROZAC) 20 MG capsule, Take 1 capsule (20 mg total) by mouth daily., Disp: 30 capsule, Rfl: 1  Observations/Objective: Physical Exam Pulmonary:     Effort: Pulmonary effort is normal.  Neurological:     Mental Status: She is alert and oriented to person, place, and time. Mental status is at baseline.  Psychiatric:        Mood and Affect: Mood normal.        Behavior: Behavior normal.        Thought Content: Thought  content normal.        Judgment: Judgment normal.       10/21/2022    3:57 PM 10/09/2022   10:10 AM 10/09/2022    9:13 AM 10/01/2022    3:42 PM 09/24/2022    3:32 PM  Depression screen PHQ 2/9  Decreased Interest '2 3 3 3 3  '$ Down, Depressed, Hopeless '1 3 3 3 3  '$ PHQ - 2 Score '3 6 6 6 6  '$ Altered sleeping '2 3  3 3  '$ Tired, decreased energy '2 3  3 3  '$ Change in appetite '1 1  3 '$ 0  Feeling bad or failure about yourself  '1 3  3 3  '$ Trouble concentrating '1 3  1 3  '$ Moving slowly or fidgety/restless 0 0  3 0  Suicidal thoughts 0 1  3 0  PHQ-9 Score '10 20  25 18  '$ Difficult doing work/chores Somewhat difficult Extremely dIfficult  Very difficult Very  difficult       10/21/2022    4:00 PM 10/09/2022   10:11 AM 10/01/2022    3:43 PM 09/24/2022    3:33 PM  GAD 7 : Generalized Anxiety Score  Nervous, Anxious, on Edge '2 3 3 3  '$ Control/stop worrying '2 3 3 3  '$ Worry too much - different things '2 3 3 3  '$ Trouble relaxing '2 3 3 3  '$ Restless '2 3 3 3  '$ Easily annoyed or irritable '2 3 3 3  '$ Afraid - awful might happen '2 3 3 3  '$ Total GAD 7 Score '14 21 21 21  '$ Anxiety Difficulty Somewhat difficult Extremely difficult Extremely difficult Very difficult     Assessment and Plan: Mood disorder ([place]) Assessment & Plan: Acute on chronic.  Improving.  PHQ-9 and GAD score significantly decreased since last visit.  Patient reports improvement in symptoms. Refill Wellbutrin 300 mg daily, 200 mg a.m. and 100 mg p.m. Refill Prozac 20 mg daily Self discontinued Lamictal Will continue to monitor, did strongly recommend she follow-up and schedule appointment with mental health provider. Mental health resources provided. Follow-up in 2 weeks.  Orders: -     FLUoxetine HCl; Take 1 capsule (20 mg total) by mouth daily.  Dispense: 30 capsule; Refill: 1 -     buPROPion HCl ER (SR); Take 3 tablets (300 mg total) by mouth daily. Take 2 tablets in the morning. Take 1 tablet  at night  Dispense: 90 tablet; Refill: 1    Follow Up Instructions: Return in about 2 weeks (around 11/04/2022).   I discussed the assessment and treatment plan with the patient. The patient was provided an opportunity to ask questions and all were answered. The patient agreed with the plan and demonstrated an understanding of the instructions.   The patient was advised to call back or seek an in-person evaluation if the symptoms worsen or if the condition fails to improve as anticipated.  The above assessment and management plan was discussed with the patient. The patient verbalized understanding of and has agreed to the management plan. Patient is aware to call the clinic if symptoms  persist or worsen. Patient is aware when to return to the clinic for a follow-up visit. Patient educated on when it is appropriate to go to the emergency department.    Carollee Leitz, MD

## 2022-10-26 ENCOUNTER — Encounter: Payer: Self-pay | Admitting: Family Medicine

## 2022-10-26 NOTE — Assessment & Plan Note (Addendum)
PHQ-9 slightly decreased while GAD remains unchanged since last visit.  Patient self discontinuing Lamictal, now at 50 mg daily. Increase Prozac to 20 mg daily Increase Wellbutrin 200 mg in the morning and 100 mg at night Monitor your blood pressure at home.  If greater than 160/100 please notify MD.   Continue to reach out to establish care with therapist to discuss stress management. Recommend following up with psychiatry for continued management. Recommend CBT

## 2022-11-02 ENCOUNTER — Encounter: Payer: Self-pay | Admitting: Family Medicine

## 2022-11-02 NOTE — Assessment & Plan Note (Addendum)
Acute on chronic.  Improving.  PHQ-9 and GAD score significantly decreased since last visit.  Patient reports improvement in symptoms. Refill Wellbutrin 300 mg daily, 200 mg a.m. and 100 mg p.m. Refill Prozac 20 mg daily Self discontinued Lamictal Will continue to monitor, did strongly recommend she follow-up and schedule appointment with mental health provider. Mental health resources provided. Follow-up in 2 weeks.

## 2022-11-13 ENCOUNTER — Encounter: Payer: Self-pay | Admitting: Family Medicine

## 2022-11-13 ENCOUNTER — Telehealth (INDEPENDENT_AMBULATORY_CARE_PROVIDER_SITE_OTHER): Payer: Medicare Other | Admitting: Family Medicine

## 2022-11-13 DIAGNOSIS — N1831 Chronic kidney disease, stage 3a: Secondary | ICD-10-CM

## 2022-11-13 DIAGNOSIS — M62838 Other muscle spasm: Secondary | ICD-10-CM | POA: Diagnosis not present

## 2022-11-13 DIAGNOSIS — M545 Low back pain, unspecified: Secondary | ICD-10-CM

## 2022-11-13 DIAGNOSIS — F39 Unspecified mood [affective] disorder: Secondary | ICD-10-CM | POA: Diagnosis not present

## 2022-11-13 DIAGNOSIS — M5136 Other intervertebral disc degeneration, lumbar region: Secondary | ICD-10-CM

## 2022-11-13 DIAGNOSIS — Z6841 Body Mass Index (BMI) 40.0 and over, adult: Secondary | ICD-10-CM

## 2022-11-13 DIAGNOSIS — M25559 Pain in unspecified hip: Secondary | ICD-10-CM

## 2022-11-13 MED ORDER — FLUOXETINE HCL 20 MG PO CAPS: 20.0000 mg | ORAL_CAPSULE | Freq: Every day | ORAL | 1 refills | Status: DC

## 2022-11-13 MED ORDER — ZEPBOUND 2.5 MG/0.5ML ~~LOC~~ SOAJ: 2.5000 mg | SUBCUTANEOUS | 0 refills | Status: DC

## 2022-11-13 MED ORDER — TIZANIDINE HCL 4 MG PO TABS: 4.0000 mg | ORAL_TABLET | Freq: Three times a day (TID) | ORAL | 1 refills | Status: DC | PRN

## 2022-11-13 NOTE — Progress Notes (Signed)
Virtual Visit via Video note  I connected with Erika Hanson on 11/29/22 at 1150 by video and verified that I am speaking with the correct person using two identifiers. Erika Hanson is currently located at work and is currently has her dog during visit. The provider, Carollee Leitz, MD is located in their office at time of visit.  I discussed the limitations, risks, security and privacy concerns of performing an evaluation and management service by video and the availability of in person appointments. I also discussed with the patient that there may be a patient responsible charge related to this service. The patient expressed understanding and agreed to proceed.  Subjective: PCP: Carollee Leitz, MD  No chief complaint on file.   HPI Presents for follow up for mood disorder. Seen in clinic on 11/27 and no changes were made to medications at that time.  Patient had self discontinued Lamictal.  Since then patient reports significant improvement in symptoms.  She reports that her home life now has improved, she now has found a place to live with her dog and the stress of having to find a new place to live has subsided.  Mood has improved.  Frustration and anger has dissipated.  Denies any SI/HI.  Back pain Having muscle spasms.  Chronic issue.  Requesting refill of tizanidine. Denies any fevers, incontinence of bowel or bladder.  No saddle anesthesia.  Weight loss. Interested in starting injectable medications.  Longstanding history of bilateral knee pain.  Unable to have surgery given elevated BMI.  Had started on Wegovy and was taking 2 mg weekly but was unable continue  given not covered under insurance.  Would like to try Zepbound.  Denies any medullary thyroid cancer or pancreatitis.  ROS: Per HPI  Current Outpatient Medications:    Adalimumab-bwwd (HADLIMA PUSHTOUCH) 40 MG/0.8ML SOAJ, '40mg'$  Subcutaneous every 2 weeks for 28 days, Disp: , Rfl:    Biotin 10000 MCG TABS, Take 2  tablets by mouth., Disp: , Rfl:    buPROPion ER (WELLBUTRIN SR) 100 MG 12 hr tablet, Take 3 tablets (300 mg total) by mouth daily. Take 2 tablets in the morning. Take 1 tablet  at night, Disp: 90 tablet, Rfl: 1   CIMZIA 2 X 200 MG/ML PSKT prefilled syringe, Inject 400 mg into the skin as needed. Pt. Takes every other week, Disp: , Rfl:    fluticasone (FLONASE) 50 MCG/ACT nasal spray, PLACE 2 SPRAYS INTO BOTH NOSTRILS DAILY.USE AFTER NASAL SALINE, Disp: 16 g, Rfl: 11   hydrochlorothiazide (HYDRODIURIL) 25 MG tablet, Take 1 tablet (25 mg total) by mouth daily., Disp: 90 tablet, Rfl: 3   linaclotide (LINZESS) 290 MCG CAPS capsule, TAKE 1 CAPSULE BY MOUTH ONCE DAILY BEFORE BREAKFAST, Disp: 90 capsule, Rfl: 3   losartan (COZAAR) 50 MG tablet, Take 1 tablet (50 mg total) by mouth daily. D/c 25 mg qd, Disp: 90 tablet, Rfl: 3   omeprazole (PRILOSEC) 20 MG capsule, Take 1 capsule (20 mg total) by mouth at bedtime., Disp: 90 capsule, Rfl: 3   ramelteon (ROZEREM) 8 MG tablet, TAKE 1 TABLET BY MOUTH EVERY NIGHT AT BEDTIME, Disp: 30 tablet, Rfl: 2   tirzepatide (ZEPBOUND) 2.5 MG/0.5ML Pen, Inject 2.5 mg into the skin once a week., Disp: 2 mL, Rfl: 0   celecoxib (CELEBREX) 50 MG capsule, Take 1 capsule (50 mg total) by mouth 2 (two) times daily., Disp: 30 capsule, Rfl: 0   FLUoxetine (PROZAC) 20 MG capsule, Take 1 capsule (20 mg total)  by mouth daily., Disp: 90 capsule, Rfl: 1   tiZANidine (ZANAFLEX) 4 MG tablet, Take 1 tablet (4 mg total) by mouth every 8 (eight) hours as needed for muscle spasms., Disp: 90 tablet, Rfl: 1  Observations/Objective: Physical Exam Pulmonary:     Effort: Pulmonary effort is normal.  Neurological:     Mental Status: She is alert and oriented to person, place, and time. Mental status is at baseline.  Psychiatric:        Mood and Affect: Mood normal.        Behavior: Behavior normal.        Thought Content: Thought content normal.        Judgment: Judgment normal.        11/13/2022   10:47 AM 10/21/2022    3:57 PM 10/09/2022   10:10 AM 10/09/2022    9:13 AM 10/01/2022    3:42 PM  Depression screen PHQ 2/9  Decreased Interest 0 '2 3 3 3  '$ Down, Depressed, Hopeless 0 '1 3 3 3  '$ PHQ - 2 Score 0 '3 6 6 6  '$ Altered sleeping 0 '2 3  3  '$ Tired, decreased energy 0 '2 3  3  '$ Change in appetite 0 '1 1  3  '$ Feeling bad or failure about yourself  0 '1 3  3  '$ Trouble concentrating 0 '1 3  1  '$ Moving slowly or fidgety/restless 0 0 0  3  Suicidal thoughts 0 0 1  3  PHQ-9 Score 0 '10 20  25  '$ Difficult doing work/chores Not difficult at all Somewhat difficult Extremely dIfficult  Very difficult       11/13/2022   10:47 AM 10/21/2022    4:00 PM 10/09/2022   10:11 AM 10/01/2022    3:43 PM  GAD 7 : Generalized Anxiety Score  Nervous, Anxious, on Edge '1 2 3 3  '$ Control/stop worrying 0 '2 3 3  '$ Worry too much - different things 0 '2 3 3  '$ Trouble relaxing 0 '2 3 3  '$ Restless 0 '2 3 3  '$ Easily annoyed or irritable '1 2 3 3  '$ Afraid - awful might happen 0 '2 3 3  '$ Total GAD 7 Score '2 14 21 21  '$ Anxiety Difficulty Not difficult at all Somewhat difficult Extremely difficult Extremely difficult     Assessment and Plan: Mood disorder ([place]) Assessment & Plan: Chronic.  PHQ-9 and GAD scores significantly decreased since last visit.  Tolerating medication well. Continue Wellbutrin 300 mg daily, 200 mg a.m. and 100 mg p.m. Refill Prozac 20 mg daily Will continue to monitor, did strongly recommend she follow-up and schedule appointment with mental health provider. Mental health resources provided. Follow-up as needed  Orders: -     FLUoxetine HCl; Take 1 capsule (20 mg total) by mouth daily.  Dispense: 90 capsule; Refill: 1  BMI 50.0-59.9, adult (HCC) Assessment & Plan: Elevated BMI.  Unable to obtain [facility]. Will try Zepbound weekly starting at 2.5 mg x 4 weeks.  If tolerating well can titrate to 5 mg on week 5. Follow-up in 2 months  Orders: -     Zepbound; Inject 2.5 mg into the skin  once a week.  Dispense: 2 mL; Refill: 0  Stage 3a chronic kidney disease (HCC) -     Basic metabolic panel; Future  Muscle spasm Assessment & Plan: Refill tizanidine 4 mg every 8 hours as needed.  Orders: -     tiZANidine HCl; Take 1 tablet (4 mg total) by mouth every 8 (eight)  hours as needed for muscle spasms.  Dispense: 90 tablet; Refill: 1  DDD (degenerative disc disease), lumbar Assessment & Plan: Chronic.  Continues to have pain and muscle spasm. Refill tizanidine 4 mg every 8 hours as needed Will check creatinine today.  If within normal limits will start Celebrex 100 mg twice daily for acute pain relief Discussed weight loss management.  Plan for injectable GLP-1/GIP weekly.   Orders: -     Celecoxib; Take 1 capsule (50 mg total) by mouth 2 (two) times daily.  Dispense: 30 capsule; Refill: 0    Follow Up Instructions: Return in about 2 months (around 01/14/2023).   I discussed the assessment and treatment plan with the patient. The patient was provided an opportunity to ask questions and all were answered. The patient agreed with the plan and demonstrated an understanding of the instructions.   The patient was advised to call back or seek an in-person evaluation if the symptoms worsen or if the condition fails to improve as anticipated.  The above assessment and management plan was discussed with the patient. The patient verbalized understanding of and has agreed to the management plan. Patient is aware to call the clinic if symptoms persist or worsen. Patient is aware when to return to the clinic for a follow-up visit. Patient educated on when it is appropriate to go to the emergency department.    Carollee Leitz, MD

## 2022-11-22 ENCOUNTER — Other Ambulatory Visit (INDEPENDENT_AMBULATORY_CARE_PROVIDER_SITE_OTHER): Payer: Medicare Other

## 2022-11-22 DIAGNOSIS — N1831 Chronic kidney disease, stage 3a: Secondary | ICD-10-CM

## 2022-11-22 LAB — BASIC METABOLIC PANEL
BUN: 20 mg/dL (ref 6–23)
CO2: 30 mEq/L (ref 19–32)
Calcium: 9.9 mg/dL (ref 8.4–10.5)
Chloride: 100 mEq/L (ref 96–112)
Creatinine, Ser: 0.98 mg/dL (ref 0.40–1.20)
GFR: 65.43 mL/min (ref >60.00)
Glucose, Bld: 91 mg/dL (ref 70–99)
Potassium: 4.4 mEq/L (ref 3.5–5.1)
Sodium: 139 mEq/L (ref 135–145)

## 2022-11-29 ENCOUNTER — Encounter: Payer: Self-pay | Admitting: Family Medicine

## 2022-11-29 DIAGNOSIS — M62838 Other muscle spasm: Secondary | ICD-10-CM | POA: Insufficient documentation

## 2022-11-29 MED ORDER — CELECOXIB 100 MG PO CAPS: 100.0000 mg | ORAL_CAPSULE | Freq: Two times a day (BID) | ORAL | 1 refills | Status: DC

## 2022-11-29 MED ORDER — CELECOXIB 50 MG PO CAPS: 50.0000 mg | ORAL_CAPSULE | Freq: Two times a day (BID) | ORAL | 0 refills | Status: DC

## 2022-11-29 NOTE — Assessment & Plan Note (Signed)
Refill tizanidine 4 mg every 8 hours as needed.

## 2022-11-29 NOTE — Assessment & Plan Note (Deleted)
Will check CMP today.  If tolerating well will trial Celebrex 100 mg twice daily for chronic pain.  And continue to monitor.

## 2022-11-29 NOTE — Assessment & Plan Note (Signed)
Chronic.  Continues to have pain and muscle spasm. Refill tizanidine 4 mg every 8 hours as needed Will check creatinine today.  If within normal limits will start Celebrex 100 mg twice daily for acute pain relief Discussed weight loss management.  Plan for injectable GLP-1/GIP weekly.

## 2022-11-29 NOTE — Patient Instructions (Signed)
It was a pleasure meeting you today. Thank you for allowing me to take part in your health care.  Our goals for today as we discussed include:  Continue fluoxetine 20 mg daily Continue Wellbutrin 300 mg daily.  Take 2 tablets in the morning and 1 tablet at night Follow-up with therapy as needed.  Start that found Zepbound 2.5 mg weekly, if tolerating medication can increase to 5 mg weekly on week 5. Will follow-up in 2 months.  For your back pain and knee pain.  Recommend you follow-up with pain management. Refill tizanidine 4 mg every 8 hours as needed. Will check kidney functions today and if normal can start Celebrex 50 mg twice daily for 2 weeks as needed to help with acute pain. Do not take any ibuprofen, Aleve or other NSAID-containing products with this medication Continue omeprazole 20 mg daily. Recommend Tylenol 650 mg 2 tablets 3 times daily to help with pain relief   If you have any questions or concerns, please do not hesitate to call the office at [phone].  I look forward to our next visit and until then take care and stay safe.  Regards,   Carollee Leitz, MD   [facility]

## 2022-11-29 NOTE — Assessment & Plan Note (Addendum)
Chronic.  PHQ-9 and GAD scores significantly decreased since last visit.  Tolerating medication well. Continue Wellbutrin 300 mg daily, 200 mg a.m. and 100 mg p.m. Refill Prozac 20 mg daily Will continue to monitor, did strongly recommend she follow-up and schedule appointment with mental health provider. Mental health resources provided. Follow-up as needed

## 2022-11-29 NOTE — Assessment & Plan Note (Signed)
Elevated BMI.  Unable to obtain [facility]. Will try Zepbound weekly starting at 2.5 mg x 4 weeks.  If tolerating well can titrate to 5 mg on week 5. Follow-up in 2 months

## 2022-12-10 ENCOUNTER — Telehealth: Payer: Self-pay

## 2022-12-10 NOTE — Telephone Encounter (Signed)
Late entry Access nurse 11/30/22  [place] RECORD AccessNurse Patient Name: Erika Hanson Gender: Female DOB: 1966/07/21 Age: 57 Y 77 M 20 D Return Phone Number: Address: City/ State/ Zip: [place] [address] Client [place] Client Site [place] Provider Carollee Leitz- MD Contact Type Call Who Is Calling Pharmacy Call Type Pharmacy Send to RN Chief Complaint Medication Question (non symptomatic) Reason for Call Request to clarify medication order Initial Comment Caller states she is calling from [place]. She also states she wants to clarify a medication order. Pharmacy Name [facility] Pharmacist Name [place] Number [phone] Translation No Nurse Assessment Nurse: Roselyn Reef, RN, Brayton Layman Date/Time Eilene Ghazi Time): 11/30/2022 9:46:57 AM Please select the assessment type ---Pharmacy clarification Additional Documentation ---Pharmacy states they have two orders for Celebrex one for 100 mg the other for 50 mg and are wanting to know if MD is wanting patient to take both or one or the other. Is there an on-call physician for the client? ---Yes Do the client directives allow paging the on call for medication concerns? ---Yes Document information that requires clarification. ---Informed pharmacy that per client directives, can only contact on call MD for clarification if medically necessary. Informed pharmacist that I can fax message over to the office so this can be addressed on Monday. Disp. Time Eilene Ghazi Time) Disposition Final User 11/30/2022 9:46:49 AM Pharmacy Call Roselyn Reef, RN, [facility] Reason: order clarfication 11/30/2022 9:59:07 AM Clinical Call Yes Roselyn Reef, RN, [facility] Final Disposition 11/30/2022 9:59:07 AM Clinical Call Yes Roselyn Reef, RN, [place]

## 2022-12-12 ENCOUNTER — Encounter: Payer: Self-pay | Admitting: Family Medicine

## 2022-12-25 ENCOUNTER — Other Ambulatory Visit: Payer: Self-pay | Admitting: Psychiatry

## 2022-12-25 DIAGNOSIS — G4701 Insomnia due to medical condition: Secondary | ICD-10-CM

## 2023-01-03 ENCOUNTER — Other Ambulatory Visit: Payer: Self-pay | Admitting: Psychiatry

## 2023-01-03 DIAGNOSIS — F3342 Major depressive disorder, recurrent, in full remission: Secondary | ICD-10-CM

## 2023-01-03 DIAGNOSIS — F411 Generalized anxiety disorder: Secondary | ICD-10-CM

## 2023-01-03 DIAGNOSIS — F4310 Post-traumatic stress disorder, unspecified: Secondary | ICD-10-CM

## 2023-01-07 ENCOUNTER — Encounter: Payer: 59 | Admitting: Family Medicine

## 2023-01-09 ENCOUNTER — Telehealth: Payer: Self-pay

## 2023-01-09 ENCOUNTER — Other Ambulatory Visit (HOSPITAL_COMMUNITY): Payer: Self-pay

## 2023-01-09 NOTE — Telephone Encounter (Signed)
Looks like pt has switched to Zepbound. Ozempic is now only paid for by insurance if pt has Type 2 diabetes. Is PA still required for Ozempic?

## 2023-01-15 ENCOUNTER — Telehealth (INDEPENDENT_AMBULATORY_CARE_PROVIDER_SITE_OTHER): Payer: 59 | Admitting: Family Medicine

## 2023-01-15 ENCOUNTER — Encounter: Payer: Self-pay | Admitting: Family Medicine

## 2023-01-15 VITALS — Ht 64.0 in | Wt 310.0 lb

## 2023-01-15 DIAGNOSIS — Z1211 Encounter for screening for malignant neoplasm of colon: Secondary | ICD-10-CM

## 2023-01-15 DIAGNOSIS — F39 Unspecified mood [affective] disorder: Secondary | ICD-10-CM | POA: Diagnosis not present

## 2023-01-15 DIAGNOSIS — M064 Inflammatory polyarthropathy: Secondary | ICD-10-CM | POA: Insufficient documentation

## 2023-01-15 MED ORDER — FLUOXETINE HCL 10 MG PO TABS: 10.0000 mg | ORAL_TABLET | Freq: Every day | ORAL | 0 refills | Status: DC

## 2023-01-15 NOTE — Progress Notes (Signed)
Virtual Visit via Video note  I connected with Erika Hanson on 01/19/23 at 1640 by video and verified that I am speaking with the correct person using two identifiers. Erika Hanson is currently located at home and is currently with her during visit. The provider, Carollee Leitz, MD is located in their office at time of visit.  I discussed the limitations, risks, security and privacy concerns of performing an evaluation and management service by video and the availability of in person appointments. I also discussed with the patient that there may be a patient responsible charge related to this service. The patient expressed understanding and agreed to proceed.  Subjective: PCP: Carollee Leitz, MD  Chief Complaint  Patient presents with   Medical Management of Chronic Issues    Medication dosage change ( prozac)     HPI Patient reports increasing anxiety.  Would like to increase on Prozac dose.  Currently at 20 mg daily.  Denies any SI/HI.  No depressive symptoms.  Feels more agitated than normal.   ROS: Per HPI  Current Outpatient Medications:    Adalimumab-bwwd (HADLIMA PUSHTOUCH) 40 MG/0.8ML SOAJ, '40mg'$  Subcutaneous every 2 weeks for 28 days, Disp: , Rfl:    Biotin 10000 MCG TABS, Take 2 tablets by mouth., Disp: , Rfl:    buPROPion ER (WELLBUTRIN SR) 100 MG 12 hr tablet, Take 3 tablets (300 mg total) by mouth daily. Take 2 tablets in the morning. Take 1 tablet  at night, Disp: 90 tablet, Rfl: 1   celecoxib (CELEBREX) 50 MG capsule, Take 1 capsule (50 mg total) by mouth 2 (two) times daily., Disp: 30 capsule, Rfl: 0   CIMZIA 2 X 200 MG/ML PSKT prefilled syringe, Inject 400 mg into the skin as needed. Pt. Takes every other week, Disp: , Rfl:    FLUoxetine (PROZAC) 10 MG tablet, Take 1 tablet (10 mg total) by mouth daily., Disp: 90 tablet, Rfl: 0   FLUoxetine (PROZAC) 20 MG capsule, Take 1 capsule (20 mg total) by mouth daily., Disp: 90 capsule, Rfl: 1   fluticasone (FLONASE) 50  MCG/ACT nasal spray, PLACE 2 SPRAYS INTO BOTH NOSTRILS DAILY.USE AFTER NASAL SALINE, Disp: 16 g, Rfl: 11   hydrochlorothiazide (HYDRODIURIL) 25 MG tablet, Take 1 tablet (25 mg total) by mouth daily., Disp: 90 tablet, Rfl: 3   linaclotide (LINZESS) 290 MCG CAPS capsule, TAKE 1 CAPSULE BY MOUTH ONCE DAILY BEFORE BREAKFAST, Disp: 90 capsule, Rfl: 3   losartan (COZAAR) 50 MG tablet, Take 1 tablet (50 mg total) by mouth daily. D/c 25 mg qd, Disp: 90 tablet, Rfl: 3   omeprazole (PRILOSEC) 20 MG capsule, Take 1 capsule (20 mg total) by mouth at bedtime., Disp: 90 capsule, Rfl: 3   tirzepatide (ZEPBOUND) 2.5 MG/0.5ML Pen, Inject 2.5 mg into the skin once a week., Disp: 2 mL, Rfl: 0   tiZANidine (ZANAFLEX) 4 MG tablet, Take 1 tablet (4 mg total) by mouth every 8 (eight) hours as needed for muscle spasms., Disp: 90 tablet, Rfl: 1  Observations/Objective: Physical Exam Pulmonary:     Effort: Pulmonary effort is normal.  Neurological:     Mental Status: She is alert and oriented to person, place, and time. Mental status is at baseline.  Psychiatric:        Mood and Affect: Mood normal.        Behavior: Behavior normal.        Thought Content: Thought content normal.        Judgment: Judgment normal.  01/15/2023    3:37 PM 11/13/2022   10:47 AM 10/21/2022    3:57 PM  PHQ9 SCORE ONLY  PHQ-9 Total Score 0 0 10       01/15/2023    3:38 PM 11/13/2022   10:47 AM 10/21/2022    4:00 PM 10/09/2022   10:11 AM  GAD 7 : Generalized Anxiety Score  Nervous, Anxious, on Edge '1 1 2 3  '$ Control/stop worrying 3 0 2 3  Worry too much - different things 3 0 2 3  Trouble relaxing 1 0 2 3  Restless 0 0 2 3  Easily annoyed or irritable '1 1 2 3  '$ Afraid - awful might happen 0 0 2 3  Total GAD 7 Score '9 2 14 21  '$ Anxiety Difficulty Somewhat difficult Not difficult at all Somewhat difficult Extremely difficult     Assessment and Plan: Mood disorder (HCC) -     FLUoxetine HCl; Take 1 tablet (10 mg total)  by mouth daily.  Dispense: 90 tablet; Refill: 0  Colon cancer screening -     Cologuard    Follow Up Instructions: Return in about 7 weeks (around 03/06/2023) for PCP.   I discussed the assessment and treatment plan with the patient. The patient was provided an opportunity to ask questions and all were answered. The patient agreed with the plan and demonstrated an understanding of the instructions.   The patient was advised to call back or seek an in-person evaluation if the symptoms worsen or if the condition fails to improve as anticipated.  The above assessment and management plan was discussed with the patient. The patient verbalized understanding of and has agreed to the management plan. Patient is aware to call the clinic if symptoms persist or worsen. Patient is aware when to return to the clinic for a follow-up visit. Patient educated on when it is appropriate to go to the emergency department.   HCM Denied colonoscopy.  Cologuard ordered Recommend shingles vaccination Pap due.  Recommend earliest convenience. Recommend schedule Medicare annual wellness.   PDMP reviewed  Carollee Leitz, MD

## 2023-01-15 NOTE — Patient Instructions (Addendum)
It was a pleasure meeting you today. Thank you for allowing me to take part in your health care.  Our goals for today as we discussed include:  Increase Prozac to 30 mg daily  Schedule Medicare annual well on Tues or Thurs afternoon  PAP due.  Please schedule appointment  Recommend Shingles vaccine.  This is a 2 dose series and can be given at your local pharmacy.  Please talk to your pharmacist about this.   If you have any questions or concerns, please do not hesitate to call the office at [phone].  I look forward to our next visit and until then take care and stay safe.  Regards,   Carollee Leitz, MD   [facility]

## 2023-01-17 ENCOUNTER — Other Ambulatory Visit: Payer: Self-pay

## 2023-01-17 ENCOUNTER — Telehealth: Payer: Self-pay

## 2023-01-17 DIAGNOSIS — F411 Generalized anxiety disorder: Secondary | ICD-10-CM

## 2023-01-17 DIAGNOSIS — F331 Major depressive disorder, recurrent, moderate: Secondary | ICD-10-CM

## 2023-01-17 DIAGNOSIS — F4310 Post-traumatic stress disorder, unspecified: Secondary | ICD-10-CM

## 2023-01-17 NOTE — Telephone Encounter (Signed)
This is not on her medication list.  Please contact the patient and see if she has enough to last until Dr. Volanda Napoleon is back in the office.  This is not a medication that I typically fill.

## 2023-01-17 NOTE — Telephone Encounter (Signed)
I called the patient and she stated that the provider had taken her off this medication and it is not needed.  Erika Hanson,cma

## 2023-01-19 ENCOUNTER — Encounter: Payer: Self-pay | Admitting: Family Medicine

## 2023-01-22 ENCOUNTER — Encounter: Payer: 59 | Admitting: Family Medicine

## 2023-01-22 DIAGNOSIS — E790 Hyperuricemia without signs of inflammatory arthritis and tophaceous disease: Secondary | ICD-10-CM | POA: Diagnosis not present

## 2023-01-22 DIAGNOSIS — M45A0 Non-radiographic axial spondyloarthritis of unspecified sites in spine: Secondary | ICD-10-CM | POA: Diagnosis not present

## 2023-02-11 ENCOUNTER — Other Ambulatory Visit: Payer: Self-pay | Admitting: Family Medicine

## 2023-02-11 DIAGNOSIS — M62838 Other muscle spasm: Secondary | ICD-10-CM

## 2023-02-11 DIAGNOSIS — F39 Unspecified mood [affective] disorder: Secondary | ICD-10-CM

## 2023-02-12 ENCOUNTER — Other Ambulatory Visit: Payer: Self-pay | Admitting: Family Medicine

## 2023-02-12 DIAGNOSIS — M5136 Other intervertebral disc degeneration, lumbar region: Secondary | ICD-10-CM

## 2023-02-13 MED ORDER — CELECOXIB 50 MG PO CAPS: 50.0000 mg | ORAL_CAPSULE | Freq: Two times a day (BID) | ORAL | 0 refills | Status: DC

## 2023-02-13 NOTE — Telephone Encounter (Signed)
I called the patient and she stated she did request the Celebrex.  Erika Hanson,cma

## 2023-02-18 ENCOUNTER — Other Ambulatory Visit: Payer: Self-pay | Admitting: Family Medicine

## 2023-02-18 DIAGNOSIS — F39 Unspecified mood [affective] disorder: Secondary | ICD-10-CM

## 2023-03-06 ENCOUNTER — Encounter: Payer: Self-pay | Admitting: Family Medicine

## 2023-03-06 ENCOUNTER — Telehealth: Payer: Self-pay

## 2023-03-06 ENCOUNTER — Ambulatory Visit (INDEPENDENT_AMBULATORY_CARE_PROVIDER_SITE_OTHER): Payer: 59 | Admitting: Family Medicine

## 2023-03-06 VITALS — BP 140/80 | HR 77 | Temp 98.1°F | Ht 64.0 in | Wt 354.2 lb

## 2023-03-06 DIAGNOSIS — I10 Essential (primary) hypertension: Secondary | ICD-10-CM

## 2023-03-06 DIAGNOSIS — M5136 Other intervertebral disc degeneration, lumbar region: Secondary | ICD-10-CM | POA: Diagnosis not present

## 2023-03-06 DIAGNOSIS — L4050 Arthropathic psoriasis, unspecified: Secondary | ICD-10-CM

## 2023-03-06 DIAGNOSIS — N1831 Chronic kidney disease, stage 3a: Secondary | ICD-10-CM | POA: Diagnosis not present

## 2023-03-06 DIAGNOSIS — M064 Inflammatory polyarthropathy: Secondary | ICD-10-CM

## 2023-03-06 DIAGNOSIS — E790 Hyperuricemia without signs of inflammatory arthritis and tophaceous disease: Secondary | ICD-10-CM

## 2023-03-06 DIAGNOSIS — F39 Unspecified mood [affective] disorder: Secondary | ICD-10-CM | POA: Diagnosis not present

## 2023-03-06 DIAGNOSIS — Z1211 Encounter for screening for malignant neoplasm of colon: Secondary | ICD-10-CM

## 2023-03-06 DIAGNOSIS — Q613 Polycystic kidney, unspecified: Secondary | ICD-10-CM

## 2023-03-06 DIAGNOSIS — Z1231 Encounter for screening mammogram for malignant neoplasm of breast: Secondary | ICD-10-CM

## 2023-03-06 DIAGNOSIS — Z6841 Body Mass Index (BMI) 40.0 and over, adult: Secondary | ICD-10-CM

## 2023-03-06 MED ORDER — LOSARTAN POTASSIUM 100 MG PO TABS: 100.0000 mg | ORAL_TABLET | Freq: Every day | ORAL | 3 refills | Status: DC

## 2023-03-06 MED ORDER — FLUOXETINE HCL 20 MG PO CAPS: 20.0000 mg | ORAL_CAPSULE | Freq: Every day | ORAL | 1 refills | Status: DC

## 2023-03-06 MED ORDER — CELECOXIB 100 MG PO CAPS: 100.0000 mg | ORAL_CAPSULE | Freq: Two times a day (BID) | ORAL | 0 refills | Status: DC

## 2023-03-06 MED ORDER — FLUOXETINE HCL 10 MG PO TABS: 10.0000 mg | ORAL_TABLET | Freq: Every day | ORAL | 0 refills | Status: DC

## 2023-03-06 NOTE — Progress Notes (Signed)
SUBJECTIVE:   Chief Complaint  Patient presents with   Transitions Of Care   HPI  Patient presents to clinic for transfer of care.   Mood disorder Last seen 01/15/23.  Prozac increased to 30 mg daily.  Since then reports feeling much better.  Mood has improved.  Sleeping well.  No decrease in appetite.  Frustration has decreased.  Denies any SI/HI.  Elevated uric acid level Patient reports was seen by pain management, reports having gout in right heel.  She continues to have pain.  She takes Celebrex 50 mg twice daily for pain.  Last uric acid acid level 7.3.  Denies any alcohol intake or increased red meat in diet.  Hypertension Elevated blood pressure today.  Asymptomatic.  Note she was rushing to get to clinic.  Compliant with current medications.  Currently on losartan 50 mg daily and hydrochlorothiazide 25 mg daily.  Obesity class III with comorbidities Unable to obtain tirzepatide 2.5 mg secondary to insurance and backorder from pharmacy.  Psoriatic arthritis/inflammatory polyarthritis Follows with Dr. Dierdre Forth.  Takes Cimzia 400 mg every other week.  CKD stage III A Follows with nephrology.  Denies any decrease in urine output, increased lower extremity edema.  Recent GFR 65.43   PERTINENT PMH / PSH: Hypertension Obesity class III Mood disorder Hyperuricemia APKD  OBJECTIVE:  BP (!) 140/80   Pulse 77   Temp 98.1 F (36.7 C) (Oral)   Ht  (1.626 m)   Wt (!) 354 lb 3.2 oz (160.7 kg)   LMP 05/21/2016   SpO2 98%   BMI 60.80 kg/m    Physical Exam Vitals reviewed.  Constitutional:      General: She is not in acute distress.    Appearance: Normal appearance. She is normal weight. She is not ill-appearing, toxic-appearing or diaphoretic.  Eyes:     General:        Right eye: No discharge.        Left eye: No discharge.     Conjunctiva/sclera: Conjunctivae normal.  Cardiovascular:     Rate and Rhythm: Normal rate and regular rhythm.     Heart sounds:  Normal heart sounds.  Pulmonary:     Effort: Pulmonary effort is normal.     Breath sounds: Normal breath sounds.  Abdominal:     General: Bowel sounds are normal.  Musculoskeletal:        General: Normal range of motion.  Skin:    General: Skin is warm and dry.  Neurological:     General: No focal deficit present.     Mental Status: She is alert and oriented to person, place, and time. Mental status is at baseline.  Psychiatric:        Mood and Affect: Mood normal.        Behavior: Behavior normal.        Thought Content: Thought content normal. Thought content does not include homicidal or suicidal ideation. Thought content does not include homicidal or suicidal plan.        Judgment: Judgment normal.       03/06/2023    9:43 AM 01/15/2023    3:37 PM 11/13/2022   10:47 AM 10/21/2022    3:57 PM 10/09/2022   10:10 AM  Depression screen PHQ 2/9  Decreased Interest 0 0 0 2 3  Down, Depressed, Hopeless 0 0 0 1 3  PHQ - 2 Score 0 0 0 3 6  Altered sleeping   0 2 3  Tired,  decreased energy   0 2 3  Change in appetite   0 1 1  Feeling bad or failure about yourself    0 1 3  Trouble concentrating   0 1 3  Moving slowly or fidgety/restless   0 0 0  Suicidal thoughts   0 0 1  PHQ-9 Score   0 10 20  Difficult doing work/chores   Not difficult at all Somewhat difficult Extremely dIfficult        03/06/2023    9:43 AM 01/15/2023    3:38 PM 11/13/2022   10:47 AM 10/21/2022    4:00 PM  GAD 7 : Generalized Anxiety Score  Nervous, Anxious, on Edge 0 1 1 2   Control/stop worrying 0 3 0 2  Worry too much - different things 0 3 0 2  Trouble relaxing 0 1 0 2  Restless 0 0 0 2  Easily annoyed or irritable 0 1 1 2   Afraid - awful might happen 0 0 0 2  Total GAD 7 Score 0 9 2 14   Anxiety Difficulty Not difficult at all Somewhat difficult Not difficult at all Somewhat difficult     ASSESSMENT/PLAN:  Mood disorder Assessment & Plan: Chronic.  PHQ-9 and GAD scores significantly  decreased since last visit.  Tolerating medication well. Continue Wellbutrin 300 mg daily, 200 mg a.m. and 100 mg p.m. Refill Prozac 30 mg daily Will continue to monitor, did strongly recommend she follow-up and schedule appointment with mental health provider. Mental health resources provided. Follow-up as needed  Orders: -     FLUoxetine HCl; Take 1 tablet (10 mg total) by mouth daily.  Dispense: 90 tablet; Refill: 0 -     FLUoxetine HCl; Take 1 capsule (20 mg total) by mouth daily.  Dispense: 90 capsule; Refill: 1  Essential hypertension Assessment & Plan: Chronic.  Not at goal. Asymptomatic Increase losartan from 50 to 100 mg daily Discontinue hydrochlorothiazide secondary to hyperuricemia Follow-up in 3 months or sooner if needed.  Orders: -     Losartan Potassium; Take 1 tablet (100 mg total) by mouth daily.  Dispense: 90 tablet; Refill: 3  DDD (degenerative disc disease), lumbar -     Celecoxib; Take 1 capsule (100 mg total) by mouth 2 (two) times daily.  Dispense: 60 capsule; Refill: 0  Breast cancer screening by mammogram -     3D Screening Mammogram, Left and Right; Future  Psoriatic arthritis Assessment & Plan: Continue to follow with rheumatology   Inflammatory polyarthritis Assessment & Plan: Chronic.  Currently on Cimzia and tolerating well. Follows with Dr. Dierdre Forth   Morbid obesity with BMI of 60.0-69.9, adult Assessment & Plan: Elevated BMI.  Unable to obtain Wegovy or Zepbound secondary to insurance and on backorder at pharmacy Difficult to exercise secondary to chronic pain Encouraged healthy diet and exercise as tolerated   Stage 3a chronic kidney disease Assessment & Plan: Chronic.  Recent GFR 65.43 Follow-up with nephrology   PKD (polycystic kidney disease) Assessment & Plan: Chronic.  Recent GFR 65.43 Follow-up with nephrology   Hyperuricemia Assessment & Plan: Recent uric acid 7.3. Will stop hydrochlorothiazide Patient to monitor  blood pressure daily.  Goal less than 140/90 Avoid alcohol and limit red meats Recheck uric acid in 3 months    HCM Cologuard previously ordered but was sent to wrong address.  Request new Cologuard be sent.  New order placed Pap due.  Patient to schedule appointment Medicare annual wellness due Shingles vaccine due.   Mammogram  up-to-date.  Due 11/24  PDMP reviewed  Return in about 3 months (around 06/05/2023) for PCP.  Dana Allan, MD

## 2023-03-06 NOTE — Telephone Encounter (Signed)
Thayer Ohm is calling from [facility] to receive clarification regarding a new prescription we sent him today for losartan (COZAAR) 100 MG tablet.  Thayer Ohm states patient had another prescription for losartan 50 MG and he would like to know if patient is supposed to discontinue taking the losartan 50 MG and start taking the 100 MG.    In the directions for the new prescription, Dr. Dana Allan states patient is to stop taking the losartan 25 MG, so he would like to have clarification.

## 2023-03-06 NOTE — Patient Instructions (Addendum)
It was a pleasure meeting you today. Thank you for allowing me to take part in your health care.  Our goals for today as we discussed include:  Stop hydrochlorothiazide Increase losartan to 100 mg daily Monitor blood pressure at home.  Goal less than 130/80.  Recommend Tylenol arthritis 650 mg 2 tablets 2 times a day. Increase Celebrex to 100 mg twice daily.  Use for 3 to 5 days.  Then as needed.  Refill sent for Prozac 30 mg daily  Prior approval was sent for tirzepatide.   Follow-up in 3 months   If you have any questions or concerns, please do not hesitate to call the office at [phone].  I look forward to our next visit and until then take care and stay safe.  Regards,   Dana Allan, MD   [facility]

## 2023-03-07 NOTE — Telephone Encounter (Signed)
I called and spoke with the pharmacist and informed him that the losartan was 100 mg and they understood.  Jshawn Hurta,cma

## 2023-03-15 ENCOUNTER — Encounter: Payer: Self-pay | Admitting: Family Medicine

## 2023-03-15 DIAGNOSIS — L4050 Arthropathic psoriasis, unspecified: Secondary | ICD-10-CM | POA: Insufficient documentation

## 2023-03-15 DIAGNOSIS — E790 Hyperuricemia without signs of inflammatory arthritis and tophaceous disease: Secondary | ICD-10-CM | POA: Insufficient documentation

## 2023-03-15 DIAGNOSIS — Z1231 Encounter for screening mammogram for malignant neoplasm of breast: Secondary | ICD-10-CM | POA: Insufficient documentation

## 2023-03-15 NOTE — Assessment & Plan Note (Signed)
Chronic.  PHQ-9 and GAD scores significantly decreased since last visit.  Tolerating medication well. Continue Wellbutrin 300 mg daily, 200 mg a.m. and 100 mg p.m. Refill Prozac 30 mg daily Will continue to monitor, did strongly recommend she follow-up and schedule appointment with mental health provider. Mental health resources provided. Follow-up as needed

## 2023-03-15 NOTE — Assessment & Plan Note (Signed)
Chronic.  Currently on Cimzia and tolerating well. Follows with Dr. Dierdre Forth

## 2023-03-15 NOTE — Assessment & Plan Note (Signed)
Elevated BMI.  Unable to obtain Wegovy or Zepbound secondary to insurance and on backorder at pharmacy Difficult to exercise secondary to chronic pain Encouraged healthy diet and exercise as tolerated

## 2023-03-15 NOTE — Assessment & Plan Note (Signed)
Chronic.  Not at goal. Asymptomatic Increase losartan from 50 to 100 mg daily Discontinue hydrochlorothiazide secondary to hyperuricemia Follow-up in 3 months or sooner if needed.

## 2023-03-15 NOTE — Assessment & Plan Note (Signed)
Recent uric acid 7.3. Will stop hydrochlorothiazide Patient to monitor blood pressure daily.  Goal less than 140/90 Avoid alcohol and limit red meats Recheck uric acid in 3 months

## 2023-03-15 NOTE — Assessment & Plan Note (Addendum)
Chronic.  Recent GFR 65.43 Follow-up with nephrology 

## 2023-03-15 NOTE — Assessment & Plan Note (Signed)
-  Continue to follow with rheumatology

## 2023-03-15 NOTE — Assessment & Plan Note (Signed)
Chronic.  Recent GFR 65.43 Follow-up with nephrology

## 2023-03-20 ENCOUNTER — Telehealth: Payer: Self-pay | Admitting: Family Medicine

## 2023-03-20 NOTE — Telephone Encounter (Signed)
Contacted Erika Hanson to schedule their annual wellness visit. Appointment made for 03/31/2023.  Thank you,  [facility] Support [facility] Direct dial  [phone]

## 2023-03-27 ENCOUNTER — Other Ambulatory Visit (HOSPITAL_COMMUNITY): Payer: Self-pay

## 2023-03-27 NOTE — Telephone Encounter (Signed)
Patient has Medicare part D. Weight loss drugs are excluded from coverage.

## 2023-03-31 NOTE — Telephone Encounter (Signed)
LVM for patient to return call to advise 

## 2023-04-02 ENCOUNTER — Other Ambulatory Visit: Payer: Self-pay | Admitting: Family Medicine

## 2023-04-02 DIAGNOSIS — M5136 Other intervertebral disc degeneration, lumbar region: Secondary | ICD-10-CM

## 2023-04-03 NOTE — Telephone Encounter (Signed)
LVM for pt to return call

## 2023-04-09 ENCOUNTER — Ambulatory Visit (INDEPENDENT_AMBULATORY_CARE_PROVIDER_SITE_OTHER): Payer: 59

## 2023-04-09 NOTE — Patient Instructions (Signed)

## 2023-04-09 NOTE — Progress Notes (Signed)
  I attempted to contact the patient for her scheduled Virtual Telephone Annual Wellness Visit. No answer. I left a detailed message on the patient's voicemail.   

## 2023-04-22 ENCOUNTER — Other Ambulatory Visit: Payer: Self-pay | Admitting: Family Medicine

## 2023-04-23 DIAGNOSIS — E790 Hyperuricemia without signs of inflammatory arthritis and tophaceous disease: Secondary | ICD-10-CM | POA: Diagnosis not present

## 2023-04-23 DIAGNOSIS — M45A0 Non-radiographic axial spondyloarthritis of unspecified sites in spine: Secondary | ICD-10-CM | POA: Diagnosis not present

## 2023-05-07 ENCOUNTER — Other Ambulatory Visit: Payer: Self-pay | Admitting: Family Medicine

## 2023-05-07 DIAGNOSIS — M62838 Other muscle spasm: Secondary | ICD-10-CM

## 2023-06-04 ENCOUNTER — Ambulatory Visit: Payer: 59 | Admitting: Family Medicine

## 2023-06-20 ENCOUNTER — Other Ambulatory Visit: Payer: Self-pay | Admitting: Family Medicine

## 2023-06-20 DIAGNOSIS — M5136 Other intervertebral disc degeneration, lumbar region: Secondary | ICD-10-CM

## 2023-06-25 ENCOUNTER — Ambulatory Visit: Payer: 59 | Admitting: Family Medicine

## 2023-06-25 ENCOUNTER — Encounter: Payer: Self-pay | Admitting: Family Medicine

## 2023-06-25 VITALS — BP 140/98 | HR 74 | Temp 97.7°F | Resp 16 | Ht 64.0 in | Wt 373.1 lb

## 2023-06-25 DIAGNOSIS — E785 Hyperlipidemia, unspecified: Secondary | ICD-10-CM | POA: Diagnosis not present

## 2023-06-25 DIAGNOSIS — I10 Essential (primary) hypertension: Secondary | ICD-10-CM | POA: Diagnosis not present

## 2023-06-25 DIAGNOSIS — Z6841 Body Mass Index (BMI) 40.0 and over, adult: Secondary | ICD-10-CM | POA: Diagnosis not present

## 2023-06-25 DIAGNOSIS — L4050 Arthropathic psoriasis, unspecified: Secondary | ICD-10-CM

## 2023-06-25 DIAGNOSIS — F39 Unspecified mood [affective] disorder: Secondary | ICD-10-CM

## 2023-06-25 LAB — LIPID PANEL
Cholesterol: 264 mg/dL — ABNORMAL HIGH (ref 0–200)
HDL: 54.9 mg/dL (ref >39.00)
NonHDL: 209.34
Total CHOL/HDL Ratio: 5
Triglycerides: 261 mg/dL — ABNORMAL HIGH (ref 0.0–149.0)
VLDL: 52.2 mg/dL — ABNORMAL HIGH (ref 0.0–40.0)

## 2023-06-25 LAB — BASIC METABOLIC PANEL
BUN: 19 mg/dL (ref 6–23)
CO2: 28 mEq/L (ref 19–32)
Calcium: 9.3 mg/dL (ref 8.4–10.5)
Chloride: 104 mEq/L (ref 96–112)
Creatinine, Ser: 1.06 mg/dL (ref 0.40–1.20)
GFR: 59.3 mL/min — ABNORMAL LOW (ref >60.00)
Glucose, Bld: 104 mg/dL — ABNORMAL HIGH (ref 70–99)
Potassium: 3.8 mEq/L (ref 3.5–5.1)
Sodium: 140 mEq/L (ref 135–145)

## 2023-06-25 LAB — VITAMIN B12: Vitamin B-12: >1501 pg/mL — ABNORMAL HIGH (ref 211–911)

## 2023-06-25 LAB — LDL CHOLESTEROL, DIRECT: Direct LDL: 175 mg/dL

## 2023-06-25 MED ORDER — TELMISARTAN 80 MG PO TABS
80.0000 mg | ORAL_TABLET | Freq: Every evening | ORAL | 3 refills | Status: DC
Start: 2023-06-25 — End: 2024-01-06

## 2023-06-25 MED ORDER — FLUOXETINE HCL 40 MG PO CAPS: 40.0000 mg | ORAL_CAPSULE | Freq: Every day | ORAL | 3 refills | Status: DC

## 2023-06-25 MED ORDER — HYDROCHLOROTHIAZIDE 25 MG PO TABS
25.0000 mg | ORAL_TABLET | Freq: Every day | ORAL | 3 refills | Status: DC
Start: 2023-06-25 — End: 2023-12-29

## 2023-06-25 NOTE — Patient Instructions (Addendum)
It was a pleasure meeting you today. Thank you for allowing me to take part in your health care.  Our goals for today as we discussed include:  Stop Losartan Start Telmisartan 80 mg at night.  If blood pressure continues to be greater than 140/90 at home will add Spironolactone.  Please notify MD in 2-4 weeks of home readings Continue Hydrochlorothiazide  Increase Prozac to 40 mg daily  We will get some labs today.  If they are abnormal or we need to do something about them, I will call you.  If they are normal, I will send you a message on MyChart (if it is active) or a letter in the mail.  If you don't hear from [place] in 2 weeks, please call the office at the number below.    If you have any questions or concerns, please do not hesitate to call the office at [phone].  I look forward to our next visit and until then take care and stay safe.  Regards,   Dana Allan, MD   [facility]

## 2023-06-25 NOTE — Progress Notes (Signed)
SUBJECTIVE:   Chief Complaint  Patient presents with   Medical Management of Chronic Issues    3 month follow up   HPI  Patient presents to clinic for chronic disease management  Mood disorder Last seen 01/15/23.  Prozac increased to 30 mg daily.  Had been doing well but has since felt like needs to increase dose of Prozac. Has had increase in anxiety and feels more irritable lately.  Denies any SI/HI  Hypertension Elevated blood pressure today.  Asymptomatic. Has had increasing pain in right heal and recently seen Rheumatology.  She is compliant with current BP medications, takes losartan 100 mg daily. Hydrochlorothiazide 25 mg daily was previously discontinued secondary to hyperuricemia however patient has self resumed as BP elevated at home.  Psoriatic arthritis/inflammatory polyarthritis Follows with Dr. Dierdre Forth.  Cimzia has been discontinued.  She recently started Secukinumab 150 mg injections and has not found this helpful. Also taking Celebrex 100 mg BID and requesting increase in medication. Hesitant to increase given HTN and CKD 3a.  PERTINENT PMH / PSH: Hypertension Obesity class III Mood disorder Hyperuricemia APKD  OBJECTIVE:  BP (!) 140/98   Pulse 74   Temp 97.7 F (36.5 C)   Resp 16   Ht 5\' 4"  (1.626 m)   Wt (!) 373 lb 2 oz (169.2 kg)   LMP 05/21/2016   SpO2 95%   BMI 64.05 kg/m    Physical Exam Vitals reviewed.  Constitutional:      General: She is not in acute distress.    Appearance: Normal appearance. She is normal weight. She is not ill-appearing, toxic-appearing or diaphoretic.  Eyes:     General:        Right eye: No discharge.        Left eye: No discharge.     Conjunctiva/sclera: Conjunctivae normal.  Cardiovascular:     Rate and Rhythm: Normal rate and regular rhythm.     Heart sounds: Normal heart sounds.  Pulmonary:     Effort: Pulmonary effort is normal.     Breath sounds: Normal breath sounds.  Abdominal:     General: Bowel  sounds are normal.  Musculoskeletal:        General: Normal range of motion.  Skin:    General: Skin is warm and dry.  Neurological:     General: No focal deficit present.     Mental Status: She is alert and oriented to person, place, and time. Mental status is at baseline.  Psychiatric:        Mood and Affect: Mood normal.        Behavior: Behavior normal.        Thought Content: Thought content normal. Thought content does not include homicidal or suicidal ideation. Thought content does not include homicidal or suicidal plan.        Judgment: Judgment normal.       06/25/2023    8:57 AM 03/06/2023    9:43 AM 01/15/2023    3:37 PM 11/13/2022   10:47 AM 10/21/2022    3:57 PM  Depression screen PHQ 2/9  Decreased Interest 1 0 0 0 2  Down, Depressed, Hopeless 1 0 0 0 1  PHQ - 2 Score 2 0 0 0 3  Altered sleeping 1   0 2  Tired, decreased energy 1   0 2  Change in appetite 0   0 1  Feeling bad or failure about yourself  0   0 1  Trouble concentrating  0   0 1  Moving slowly or fidgety/restless 0   0 0  Suicidal thoughts 0   0 0  PHQ-9 Score 4   0 10  Difficult doing work/chores Somewhat difficult   Not difficult at all Somewhat difficult        06/25/2023    8:57 AM 03/06/2023    9:43 AM 01/15/2023    3:38 PM 11/13/2022   10:47 AM  GAD 7 : Generalized Anxiety Score  Nervous, Anxious, on Edge 2 0 1 1  Control/stop worrying 2 0 3 0  Worry too much - different things 2 0 3 0  Trouble relaxing 1 0 1 0  Restless 1 0 0 0  Easily annoyed or irritable 3 0 1 1  Afraid - awful might happen 1 0 0 0  Total GAD 7 Score 12 0 9 2  Anxiety Difficulty Somewhat difficult Not difficult at all Somewhat difficult Not difficult at all     ASSESSMENT/PLAN:  Mood disorder [facility]) Assessment & Plan: Chronic.  PHQ-9 and GAD scores increased  Continue Wellbutrin 300 mg daily, 200 mg a.m. and 100 mg p.m. Increase Prozac 40 mg daily Discussed that she initially was seen by Mental Health provider  and would benefit from continuing if no improvement with increase in medications Mental health resources provided.   Orders: -     FLUoxetine HCl; Take 1 capsule (40 mg total) by mouth daily.  Dispense: 90 capsule; Refill: 3 -     Vitamin B12  Essential hypertension Assessment & Plan: Chronic.  Not at goal. Asymptomatic Discontinue Losartan Start Telmisartan 80 mg daily Continue Hydrochlorothiazide 25 mg daily Monitor BP at home, goal <140/90   Orders: -     Telmisartan; Take 1 tablet (80 mg total) by mouth at bedtime.  Dispense: 90 tablet; Refill: 3 -     hydroCHLOROthiazide; Take 1 tablet (25 mg total) by mouth daily.  Dispense: 90 tablet; Refill: 3 -     Basic metabolic panel  Morbid obesity with BMI of 60.0-69.9, adult (HCC) Assessment & Plan: Elevated BMI.   Difficult to exercise secondary to chronic pain Encouraged healthy diet and exercise as tolerated   Hyperlipidemia, unspecified hyperlipidemia type Assessment & Plan: The 10-year ASCVD risk score (Arnett DK, et al., 2019) is: 4.3%  Check fasting lipids  Orders: -     Lipid panel -     LDL cholesterol, direct  Psoriatic arthritis (HCC) Assessment & Plan: Chronic Cimza discontinued Currently on Secukinumab 150 mg injection. Not finding effective Continue Celebrex 100 mg BID, may need other options for pain management. Hesitant to increase dose given HTN and CKD 3a Continue to follow with rheumatology    HCM Cologuard ordered 01/15/2023 Pap due.  Patient to schedule appointment Medicare annual wellness due Shingles vaccine due.   Mammogram up-to-date.  Due 11/24  PDMP reviewed  Return if symptoms worsen or fail to improve, for PCP.  Dana Allan, MD

## 2023-06-28 ENCOUNTER — Other Ambulatory Visit: Payer: Self-pay | Admitting: Family Medicine

## 2023-06-28 DIAGNOSIS — F39 Unspecified mood [affective] disorder: Secondary | ICD-10-CM

## 2023-07-11 ENCOUNTER — Encounter: Payer: Self-pay | Admitting: Family Medicine

## 2023-07-11 NOTE — Assessment & Plan Note (Addendum)
Chronic.  Not at goal. Asymptomatic Discontinue Losartan Start Telmisartan 80 mg daily Continue Hydrochlorothiazide 25 mg daily Monitor BP at home, goal <140/90

## 2023-07-11 NOTE — Assessment & Plan Note (Signed)
Chronic.  PHQ-9 and GAD scores increased  Continue Wellbutrin 300 mg daily, 200 mg a.m. and 100 mg p.m. Increase Prozac 40 mg daily Discussed that she initially was seen by Mental Health provider and would benefit from continuing if no improvement with increase in medications Mental health resources provided.

## 2023-07-11 NOTE — Assessment & Plan Note (Signed)
The 10-year ASCVD risk score (Arnett DK, et al., 2019) is: 4.3%  Check fasting lipids

## 2023-07-11 NOTE — Assessment & Plan Note (Signed)
Elevated BMI.   Difficult to exercise secondary to chronic pain Encouraged healthy diet and exercise as tolerated

## 2023-07-11 NOTE — Assessment & Plan Note (Addendum)
Chronic Cimza discontinued Currently on Secukinumab 150 mg injection. Not finding effective Continue Celebrex 100 mg BID, may need other options for pain management. Hesitant to increase dose given HTN and CKD 3a Continue to follow with rheumatology

## 2023-07-17 ENCOUNTER — Other Ambulatory Visit: Payer: Self-pay | Admitting: Family Medicine

## 2023-07-17 ENCOUNTER — Encounter: Payer: Self-pay | Admitting: Family Medicine

## 2023-07-17 DIAGNOSIS — L4050 Arthropathic psoriasis, unspecified: Secondary | ICD-10-CM

## 2023-07-21 NOTE — Telephone Encounter (Signed)
Called and informed pt that the referral was sent.

## 2023-08-06 ENCOUNTER — Other Ambulatory Visit: Payer: Self-pay | Admitting: Family Medicine

## 2023-08-06 DIAGNOSIS — M62838 Other muscle spasm: Secondary | ICD-10-CM

## 2023-09-09 ENCOUNTER — Other Ambulatory Visit: Payer: Self-pay | Admitting: Family Medicine

## 2023-09-09 DIAGNOSIS — M62838 Other muscle spasm: Secondary | ICD-10-CM

## 2023-09-12 ENCOUNTER — Other Ambulatory Visit: Payer: Self-pay | Admitting: Family Medicine

## 2023-09-12 DIAGNOSIS — M51369 Other intervertebral disc degeneration, lumbar region without mention of lumbar back pain or lower extremity pain: Secondary | ICD-10-CM

## 2023-09-13 ENCOUNTER — Other Ambulatory Visit: Payer: Self-pay | Admitting: Family Medicine

## 2023-09-13 DIAGNOSIS — K219 Gastro-esophageal reflux disease without esophagitis: Secondary | ICD-10-CM

## 2023-11-03 ENCOUNTER — Ambulatory Visit (INDEPENDENT_AMBULATORY_CARE_PROVIDER_SITE_OTHER): Payer: 59 | Admitting: *Deleted

## 2023-11-03 VITALS — Ht 64.0 in | Wt 373.0 lb

## 2023-11-03 DIAGNOSIS — Z0000 Encounter for general adult medical examination without abnormal findings: Secondary | ICD-10-CM

## 2023-11-03 NOTE — Patient Instructions (Signed)
Ms. Slominski , Thank you for taking time to come for your Medicare Wellness Visit. I appreciate your ongoing commitment to your health goals. Please review the following plan we discussed and let me know if I can assist you in the future.   Referrals/Orders/Follow-Ups/Clinician Recommendations: Consider updating your vaccines and colorectal cancer screening  This is a list of the screening recommended for you and due dates:  Health Maintenance  Topic Date Due   Cologuard (Stool DNA test)  Never done   Zoster (Shingles) Vaccine (1 of 2) Never done   Flu Shot  Never done   Pap with HPV screening  08/27/2023   Mammogram  09/26/2023   Medicare Annual Wellness Visit  11/02/2024   DTaP/Tdap/Td vaccine (2 - Td or Tdap) 06/18/2028   Hepatitis C Screening  Completed   HIV Screening  Completed   HPV Vaccine  Aged Out   COVID-19 Vaccine  Discontinued    Advanced directives: (Declined) Advance directive discussed with you today. Even though you declined this today, please call our office should you change your mind, and we can give you the proper paperwork for you to fill out.  Next Medicare Annual Wellness Visit scheduled for next year: Yes 11/03/24 @ 3:00
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
## 2023-11-24 ENCOUNTER — Other Ambulatory Visit: Payer: Self-pay | Admitting: Family Medicine

## 2023-11-24 DIAGNOSIS — M62838 Other muscle spasm: Secondary | ICD-10-CM

## 2023-12-09 ENCOUNTER — Other Ambulatory Visit: Payer: Self-pay | Admitting: Family Medicine

## 2023-12-09 DIAGNOSIS — M51369 Other intervertebral disc degeneration, lumbar region without mention of lumbar back pain or lower extremity pain: Secondary | ICD-10-CM

## 2023-12-15 ENCOUNTER — Other Ambulatory Visit: Payer: Self-pay | Admitting: Family Medicine

## 2023-12-15 DIAGNOSIS — F39 Unspecified mood [affective] disorder: Secondary | ICD-10-CM

## 2023-12-15 NOTE — Telephone Encounter (Signed)
Left message to return call to our office.  Calling pt to see if she requested the medication or did the pharmacy just do an auto refill.

## 2023-12-29 ENCOUNTER — Ambulatory Visit
Admission: RE | Admit: 2023-12-29 | Discharge: 2023-12-29 | Disposition: A | Discharge status: Home / Self Care | Payer: 59 | Source: Ambulatory Visit | Attending: Family Medicine | Admitting: Family Medicine

## 2023-12-29 ENCOUNTER — Encounter: Payer: Self-pay | Admitting: Family Medicine

## 2023-12-29 ENCOUNTER — Ambulatory Visit (INDEPENDENT_AMBULATORY_CARE_PROVIDER_SITE_OTHER): Payer: 59 | Admitting: Family Medicine

## 2023-12-29 VITALS — BP 138/83 | HR 75 | Temp 98.2°F | Resp 18 | Ht 64.0 in | Wt 373.0 lb

## 2023-12-29 DIAGNOSIS — R1011 Right upper quadrant pain: Secondary | ICD-10-CM | POA: Insufficient documentation

## 2023-12-29 DIAGNOSIS — G894 Chronic pain syndrome: Secondary | ICD-10-CM | POA: Diagnosis not present

## 2023-12-29 DIAGNOSIS — I10 Essential (primary) hypertension: Secondary | ICD-10-CM | POA: Diagnosis not present

## 2023-12-29 DIAGNOSIS — R110 Nausea: Secondary | ICD-10-CM | POA: Insufficient documentation

## 2023-12-29 DIAGNOSIS — R8290 Unspecified abnormal findings in urine: Secondary | ICD-10-CM

## 2023-12-29 DIAGNOSIS — F39 Unspecified mood [affective] disorder: Secondary | ICD-10-CM

## 2023-12-29 DIAGNOSIS — K581 Irritable bowel syndrome with constipation: Secondary | ICD-10-CM | POA: Insufficient documentation

## 2023-12-29 HISTORY — DX: Right upper quadrant pain: R10.11

## 2023-12-29 LAB — CBC WITH DIFFERENTIAL/PLATELET
Basophils Absolute: 0 10*3/uL (ref 0.0–0.1)
Basophils Relative: 0.6 % (ref 0.0–3.0)
Eosinophils Absolute: 0.5 10*3/uL (ref 0.0–0.7)
Eosinophils Relative: 6.6 % — ABNORMAL HIGH (ref 0.0–5.0)
HCT: 40.6 % (ref 36.0–46.0)
Hemoglobin: 13.2 g/dL (ref 12.0–15.0)
Lymphocytes Relative: 24 % (ref 12.0–46.0)
Lymphs Abs: 1.7 10*3/uL (ref 0.7–4.0)
MCHC: 32.4 g/dL (ref 30.0–36.0)
MCV: 89.5 fL (ref 78.0–100.0)
Monocytes Absolute: 0.6 10*3/uL (ref 0.1–1.0)
Monocytes Relative: 8.9 % (ref 3.0–12.0)
Neutro Abs: 4.2 10*3/uL (ref 1.4–7.7)
Neutrophils Relative %: 59.9 % (ref 43.0–77.0)
Platelets: 295 10*3/uL (ref 150.0–400.0)
RBC: 4.54 Mil/uL (ref 3.87–5.11)
RDW: 15 % (ref 11.5–15.5)
WBC: 7.1 10*3/uL (ref 4.0–10.5)

## 2023-12-29 LAB — POCT URINALYSIS DIPSTICK
Bilirubin, UA: NEGATIVE
Blood, UA: NEGATIVE
Glucose, UA: NEGATIVE
Ketones, UA: NEGATIVE
Nitrite, UA: NEGATIVE
Protein, UA: POSITIVE — AB
Spec Grav, UA: 1.025 (ref 1.010–1.025)
Urobilinogen, UA: 0.2 U/dL
pH, UA: 5.5 (ref 5.0–8.0)

## 2023-12-29 LAB — COMPREHENSIVE METABOLIC PANEL
ALT: 21 U/L (ref 0–35)
AST: 16 U/L (ref 0–37)
Albumin: 3.9 g/dL (ref 3.5–5.2)
Alkaline Phosphatase: 116 U/L (ref 39–117)
BUN: 23 mg/dL (ref 6–23)
CO2: 26 meq/L (ref 19–32)
Calcium: 9.1 mg/dL (ref 8.4–10.5)
Chloride: 102 meq/L (ref 96–112)
Creatinine, Ser: 1.36 mg/dL — ABNORMAL HIGH (ref 0.40–1.20)
GFR: 43.81 mL/min — ABNORMAL LOW (ref >60.00)
Glucose, Bld: 95 mg/dL (ref 70–99)
Potassium: 4.5 meq/L (ref 3.5–5.1)
Sodium: 136 meq/L (ref 135–145)
Total Bilirubin: 0.3 mg/dL (ref 0.2–1.2)
Total Protein: 6.9 g/dL (ref 6.0–8.3)

## 2023-12-29 LAB — C-REACTIVE PROTEIN: CRP: <1 mg/dL (ref 0.5–20.0)

## 2023-12-29 LAB — LIPASE: Lipase: 34 U/L (ref 11.0–59.0)

## 2023-12-29 LAB — POCT I-STAT CREATININE: Creatinine, Ser: 1.7 mg/dL — ABNORMAL HIGH (ref 0.44–1.00)

## 2023-12-29 MED ORDER — LINACLOTIDE 290 MCG PO CAPS
ORAL_CAPSULE | ORAL | 3 refills | Status: DC
Start: 2023-12-29 — End: 2024-08-19

## 2023-12-29 MED ORDER — IOHEXOL 300 MG/ML  SOLN
100.0000 mL | Freq: Once | INTRAMUSCULAR | Status: AC | PRN
  Administered 2023-12-29: 80 mL via INTRAVENOUS

## 2023-12-29 MED ORDER — BREXPIPRAZOLE 0.25 MG PO TABS
0.5000 mg | ORAL_TABLET | Freq: Every day | ORAL | 1 refills | Status: DC
Start: 2023-12-29 — End: 2024-01-01

## 2023-12-29 MED ORDER — INDAPAMIDE 2.5 MG PO TABS
2.5000 mg | ORAL_TABLET | Freq: Every day | ORAL | 1 refills | Status: DC
Start: 2023-12-29 — End: 2024-01-06

## 2023-12-29 MED ORDER — AMLODIPINE BESYLATE 2.5 MG PO TABS
2.5000 mg | ORAL_TABLET | Freq: Every day | ORAL | 0 refills | Status: DC
Start: 2023-12-29 — End: 2024-01-06

## 2023-12-29 MED ORDER — ONDANSETRON HCL 4 MG PO TABS: 4.0000 mg | ORAL_TABLET | Freq: Three times a day (TID) | ORAL | 0 refills | Status: DC | PRN

## 2023-12-29 MED ORDER — RALOXIFENE HCL 60 MG PO TABS
60.0000 mg | ORAL_TABLET | Freq: Every day | ORAL | 1 refills | Status: DC
Start: 2023-12-29 — End: 2024-02-11

## 2023-12-29 NOTE — Assessment & Plan Note (Signed)
New onset of abdominal pain for the past three weeks, described as spasmodic and radiating to the back. No associated nausea, vomiting, or fever. Constipation reported. Tenderness noted on physical examination. -Order blood work to check for pancreatitis. -Order CT of the abdomen to further evaluate the cause of pain.

## 2023-12-29 NOTE — Assessment & Plan Note (Addendum)
 Current regimen of Celebrex 200mg  daily not optimal due to potential impact on blood pressure. Discussed alternative options. -Start Ralefen  500 MG two times a day as needed for pain management. -Stop Celebrex -Discuss with rheumatologist about potential alternatives for pain management.

## 2023-12-29 NOTE — Assessment & Plan Note (Addendum)
Chronic.  Not at goal. Currently having pain, but reports BP not controlled at home ContinueTelmisartan 80 mg daily Discontinue Hydrochlorothiazide 25 mg daily Start Amlodipine 2.5 mg at night Start Indapamide 2.5 mg daily Recheck Bmet in 2 weeks Monitor BP at home, goal <140/90

## 2023-12-29 NOTE — Patient Instructions (Addendum)
It was a pleasure meeting you today. Thank you for allowing me to take part in your health care.  Our goals for today as we discussed include:  CT has been ordered  Labs today  Start Rexulti 0.5 mg daily Continue current medication  Stop hydrochlorothiazide Start Indapamide 2.5 mg daily Start Amlodipine 2.5 mg at night  Schedule lab appointment for 2 weeks  Follow up in 4 weeks PCP  If you have any questions or concerns, please do not hesitate to call the office at [phone].  I look forward to our next visit and until then take care and stay safe.  Regards,   Dana Allan, MD   [facility]

## 2023-12-29 NOTE — Progress Notes (Addendum)
 SUBJECTIVE:   Chief Complaint  Patient presents with   mood disorder    6 month follow up   HPI Presents for follow up chronic disease management  Discussed the use of AI scribe software for clinical note transcription with the patient, who gave verbal consent to proceed.  History of Present Illness The patient presents with hypertension and abdominal pain.  The patient is here for a follow-up regarding hypertension. Their blood pressure remains elevated, currently around 150/90 mmHg, despite taking hydrochlorothiazide 25 mg and Micardis. This morning, their blood pressure was 152/108 mmHg, which they attribute to a stressful morning. They have not tried amlodipine before. They are also taking Celebrex 200 mg once daily for pain management, which may not be ideal for their blood pressure.  They have been experiencing abdominal pain for the past three weeks. The pain is described as a spasm that 'locks up' and takes their breath away, radiating from the abdomen to the back. The pain is constant, but the spasms come and go. They have been experiencing constipation and occasional nausea, but no vomiting or fever. There is no link to specific foods. Ibuprofen dulls the pain but does not eliminate it. They primarily drink tea and occasionally ginger ale, and have a history of fatty liver. Recent blood work showed elevated liver enzymes.  They are currently taking Prozac and had a recent increase in dosage. They express high stress levels and note that Rexulti worked well for them in the past. They are also taking Wellbutrin. No vomiting, fever, diarrhea, blood in stool, changes in diet or activity, trauma, difficulty with urination, or blood in urine.        PERTINENT PMH / PSH:   OBJECTIVE:  BP 138/83   Pulse 75   Temp 98.2 F (36.8 C)   Resp 18   Ht 5\' 4"  (1.626 m)   Wt (!) 373 lb (169.2 kg)   LMP 05/21/2016   SpO2 96%   BMI 64.03 kg/m    Physical Exam Vitals reviewed.   Constitutional:      General: She is not in acute distress.    Appearance: Normal appearance. She is obese. She is not ill-appearing, toxic-appearing or diaphoretic.  Eyes:     General:        Right eye: No discharge.        Left eye: No discharge.     Conjunctiva/sclera: Conjunctivae normal.  Cardiovascular:     Rate and Rhythm: Normal rate and regular rhythm.     Heart sounds: Normal heart sounds.  Pulmonary:     Effort: Pulmonary effort is normal.     Breath sounds: Normal breath sounds.  Abdominal:     General: Bowel sounds are normal.     Palpations: There is no mass.     Tenderness: There is abdominal tenderness in the right upper quadrant and epigastric area. There is no right CVA tenderness, left CVA tenderness or rebound. Positive signs include Murphy's sign.  Musculoskeletal:        General: Normal range of motion.  Skin:    General: Skin is warm and dry.  Neurological:     General: No focal deficit present.     Mental Status: She is alert and oriented to person, place, and time. Mental status is at baseline.  Psychiatric:        Mood and Affect: Mood normal.        Behavior: Behavior normal.  Thought Content: Thought content normal.        Judgment: Judgment normal.           02/11/2024    8:49 AM 12/29/2023    8:12 AM 11/03/2023    3:13 PM 06/25/2023    8:57 AM 03/06/2023    9:43 AM  Depression screen PHQ 2/9  Decreased Interest 0 1 2 1  0  Down, Depressed, Hopeless 0 1 0 1 0  PHQ - 2 Score 0 2 2 2  0  Altered sleeping 1 2 1 1    Tired, decreased energy 0 2 1 1    Change in appetite 0 1 0 0   Feeling bad or failure about yourself  0 1 0 0   Trouble concentrating 0 0 1 0   Moving slowly or fidgety/restless 0 0 0 0   Suicidal thoughts 0 1 0 0   PHQ-9 Score 1 9 5 4    Difficult doing work/chores Not difficult at all Somewhat difficult Somewhat difficult Somewhat difficult       02/11/2024    8:49 AM 12/29/2023    8:12 AM 06/25/2023    8:57 AM 03/06/2023     9:43 AM  GAD 7 : Generalized Anxiety Score  Nervous, Anxious, on Edge 0 2 2 0  Control/stop worrying 0 2 2 0  Worry too much - different things 0 2 2 0  Trouble relaxing 0 1 1 0  Restless 0 1 1 0  Easily annoyed or irritable 1 2 3  0  Afraid - awful might happen 0 1 1 0  Total GAD 7 Score 1 11 12  0  Anxiety Difficulty Not difficult at all Somewhat difficult Somewhat difficult Not difficult at all    ASSESSMENT/PLAN:  Essential hypertension Assessment & Plan: Chronic.  Not at goal. Currently having pain, but reports BP not controlled at home ContinueTelmisartan 80 mg daily Discontinue Hydrochlorothiazide 25 mg daily Start Amlodipine 2.5 mg at night Start Indapamide 2.5 mg daily Recheck Bmet in 2 weeks Monitor BP at home, goal <140/90   Orders: -     Basic metabolic panel; Future  Irritable bowel syndrome with constipation -     linaCLOtide; TAKE 1 CAPSULE BY MOUTH ONCE DAILY BEFORE BREAKFAST  Dispense: 90 capsule; Refill: 3  Mood disorder (HCC) Assessment & Plan: Chronic.  PHQ-9 and GAD scores increased  Continue Wellbutrin 300 mg daily, 200 mg a.m. and 100 mg p.m. Increase Prozac 40 mg daily Discussed that she initially was seen by Mental Health provider and would benefit from continuing if no improvement with increase in medications Mental health resources provided. Patient reports stress and potential need for adjustment in medication. -Add Rexulti at a low dose.    Chronic pain syndrome Assessment & Plan: Current regimen of Celebrex 200mg  daily not optimal due to potential impact on blood pressure. Discussed alternative options. -Start Ralefen  500 MG two times a day as needed for pain management. -Stop Celebrex -Discuss with rheumatologist about potential alternatives for pain management.   Right upper quadrant abdominal pain Assessment & Plan: New onset of abdominal pain for the past three weeks, described as spasmodic and radiating to the back. No  associated nausea, vomiting, or fever. Constipation reported. Tenderness noted on physical examination. -Order blood work to check for pancreatitis. -Order CT of the abdomen to further evaluate the cause of pain.   Orders: -     CT ABDOMEN PELVIS W WO CONTRAST -     Lipase -  CBC with Differential/Platelet -     Comprehensive metabolic panel -     C-reactive protein -     POCT urinalysis dipstick  Nausea -     Ondansetron HCl; Take 1 tablet (4 mg total) by mouth every 8 (eight) hours as needed for nausea or vomiting.  Dispense: 10 tablet; Refill: 0  Abnormal urine -     Urine Culture      PDMP reviewed  Return in about 2 weeks (around 01/12/2024) for LAB.  Dana Allan, MD

## 2023-12-29 NOTE — Assessment & Plan Note (Addendum)
Chronic.  PHQ-9 and GAD scores increased  Continue Wellbutrin 300 mg daily, 200 mg a.m. and 100 mg p.m. Increase Prozac 40 mg daily Discussed that she initially was seen by Mental Health provider and would benefit from continuing if no improvement with increase in medications Mental health resources provided. Patient reports stress and potential need for adjustment in medication. -Add Rexulti at a low dose.

## 2023-12-30 LAB — URINE CULTURE
MICRO NUMBER:: 16032965
Result:: NO GROWTH
SPECIMEN QUALITY:: ADEQUATE

## 2023-12-31 ENCOUNTER — Other Ambulatory Visit: Payer: Self-pay | Admitting: Family Medicine

## 2023-12-31 DIAGNOSIS — R110 Nausea: Secondary | ICD-10-CM

## 2024-01-01 ENCOUNTER — Other Ambulatory Visit: Payer: Self-pay | Admitting: Family Medicine

## 2024-01-01 DIAGNOSIS — F39 Unspecified mood [affective] disorder: Secondary | ICD-10-CM

## 2024-01-01 MED ORDER — BREXPIPRAZOLE 0.5 MG PO TABS: 0.5000 mg | ORAL_TABLET | Freq: Every day | ORAL | 3 refills | Status: DC

## 2024-01-02 ENCOUNTER — Other Ambulatory Visit (HOSPITAL_COMMUNITY): Payer: Self-pay

## 2024-01-06 ENCOUNTER — Encounter: Payer: Self-pay | Admitting: Family Medicine

## 2024-01-06 ENCOUNTER — Ambulatory Visit (INDEPENDENT_AMBULATORY_CARE_PROVIDER_SITE_OTHER): Payer: 59 | Admitting: Family Medicine

## 2024-01-06 VITALS — BP 132/83 | HR 73 | Temp 97.7°F | Resp 18 | Ht 64.0 in | Wt 370.2 lb

## 2024-01-06 DIAGNOSIS — I10 Essential (primary) hypertension: Secondary | ICD-10-CM

## 2024-01-06 DIAGNOSIS — R160 Hepatomegaly, not elsewhere classified: Secondary | ICD-10-CM

## 2024-01-06 DIAGNOSIS — F39 Unspecified mood [affective] disorder: Secondary | ICD-10-CM

## 2024-01-06 DIAGNOSIS — H00025 Hordeolum internum left lower eyelid: Secondary | ICD-10-CM | POA: Insufficient documentation

## 2024-01-06 DIAGNOSIS — R1011 Right upper quadrant pain: Secondary | ICD-10-CM | POA: Diagnosis not present

## 2024-01-06 DIAGNOSIS — Q613 Polycystic kidney, unspecified: Secondary | ICD-10-CM

## 2024-01-06 DIAGNOSIS — K581 Irritable bowel syndrome with constipation: Secondary | ICD-10-CM

## 2024-01-06 LAB — C-REACTIVE PROTEIN: CRP: <1 mg/dL (ref 0.5–20.0)

## 2024-01-06 LAB — GAMMA GT: GGT: 38 U/L (ref 7–51)

## 2024-01-06 LAB — LIPASE: Lipase: 36 U/L (ref 11.0–59.0)

## 2024-01-06 MED ORDER — AMLODIPINE BESYLATE 2.5 MG PO TABS
2.5000 mg | ORAL_TABLET | Freq: Every day | ORAL | 3 refills | Status: DC
Start: 2024-01-06 — End: 2024-02-11

## 2024-01-06 MED ORDER — INDAPAMIDE 2.5 MG PO TABS: 2.5000 mg | ORAL_TABLET | Freq: Every day | ORAL | 3 refills | Status: DC

## 2024-01-06 MED ORDER — TRAMADOL HCL 50 MG PO TABS: 50.0000 mg | ORAL_TABLET | Freq: Three times a day (TID) | ORAL | 0 refills | Status: AC | PRN

## 2024-01-06 MED ORDER — TELMISARTAN 80 MG PO TABS: 80.0000 mg | ORAL_TABLET | Freq: Every evening | ORAL | 3 refills | Status: DC

## 2024-01-06 NOTE — Progress Notes (Signed)
SUBJECTIVE:   Chief Complaint  Patient presents with   Abdominal Pain    X 4 weeks Hurting worse then when she seen [place] last week   HPI Presents for acute visit  Discussed the use of AI scribe software for clinical note transcription with the patient, who gave verbal consent to proceed.  History of Present Illness Erika Hanson "[name]" is a 58 year old female with asthma and cystic kidney disease who presents with right-sided abdominal pain.  She has been experiencing right-sided abdominal pain for approximately four weeks. The pain is constant, with occasional shooting pains radiating into her back. It worsens with deep breaths and is most intense upon waking and after sitting up. The pain does not typically disturb her sleep, except for one night when it was bothersome.  Endorses increase in belching, some nausea without vomiting and has noticed increased pain after eating but no specific food triggers.  A CT scan did not reveal any gallstones, pancreatitis, or other abnormalities in the pancreas, spleen, kidneys, liver, or reproductive system, but it did show hepatomegaly. Liver function tests were normal. No bowel obstruction, hernia, or fluid accumulation in the abdomen has been noted. No fever, nausea, or vomiting. The pain is not related to food intake.  She has a history of asthma and has recently stopped taking steroids as suggested by her pulmonologist. She is currently on hydrochlorothiazide and indapamide for blood pressure management, which has been effective. She also takes Protonix and Linzess, and reports regular bowel movements.  Her past medical history includes cystic kidney disease, which has been associated with high blood pressure. She has a benign cyst on the left kidney, which is not believed to be causing her current symptoms. Her kidney function has shown slight decline, and she has previously seen a nephrologist.     PERTINENT PMH / PSH: As above  OBJECTIVE:   BP 132/83   Pulse 73   Temp 97.7 F (36.5 C) (Oral)   Resp 18   Ht 5\' 4"  (1.626 m)   Wt (!) 370 lb 4 oz (167.9 kg)   LMP 05/21/2016   SpO2 100%   BMI 63.55 kg/m    Physical Exam Vitals reviewed.  Constitutional:      General: She is not in acute distress.    Appearance: Normal appearance. She is obese. She is not ill-appearing, toxic-appearing or diaphoretic.  Eyes:     General:        Right eye: No discharge.        Left eye: No discharge.     Conjunctiva/sclera: Conjunctivae normal.  Cardiovascular:     Rate and Rhythm: Normal rate and regular rhythm.     Heart sounds: Normal heart sounds.  Pulmonary:     Effort: Pulmonary effort is normal.     Breath sounds: Normal breath sounds.  Abdominal:     General: There is no distension.     Tenderness: There is abdominal tenderness in the right upper quadrant. There is no guarding.  Musculoskeletal:        General: Normal range of motion.  Skin:    General: Skin is warm and dry.  Neurological:     General: No focal deficit present.     Mental Status: She is alert and oriented to person, place, and time. Mental status is at baseline.  Psychiatric:        Mood and Affect: Mood normal.        Behavior: Behavior normal.  Thought Content: Thought content normal.        Judgment: Judgment normal.           12/29/2023    8:12 AM 11/03/2023    3:13 PM 06/25/2023    8:57 AM 03/06/2023    9:43 AM 01/15/2023    3:37 PM  Depression screen PHQ 2/9  Decreased Interest 1 2 1  0 0  Down, Depressed, Hopeless 1 0 1 0 0  PHQ - 2 Score 2 2 2  0 0  Altered sleeping 2 1 1     Tired, decreased energy 2 1 1     Change in appetite 1 0 0    Feeling bad or failure about yourself  1 0 0    Trouble concentrating 0 1 0    Moving slowly or fidgety/restless 0 0 0    Suicidal thoughts 1 0 0    PHQ-9 Score 9 5 4     Difficult doing work/chores Somewhat difficult Somewhat difficult Somewhat difficult        12/29/2023    8:12 AM 06/25/2023     8:57 AM 03/06/2023    9:43 AM 01/15/2023    3:38 PM  GAD 7 : Generalized Anxiety Score  Nervous, Anxious, on Edge 2 2 0 1  Control/stop worrying 2 2 0 3  Worry too much - different things 2 2 0 3  Trouble relaxing 1 1 0 1  Restless 1 1 0 0  Easily annoyed or irritable 2 3 0 1  Afraid - awful might happen 1 1 0 0  Total GAD 7 Score 11 12 0 9  Anxiety Difficulty Somewhat difficult Somewhat difficult Not difficult at all Somewhat difficult    ASSESSMENT/PLAN:  Right upper quadrant abdominal pain Assessment & Plan: Chronic pain, now constant and radiating to the back. No clear etiology on CT scan. No gallstones, pancreatitis, or bowel obstruction noted. Hepatomegaly present but liver enzymes normal. -Order right upper quadrant ultrasound to further evaluate for gallstones -Consider GI referral if no improvement -Tramadol 50 mg q8h prn x 3 days  Orders: -     Lipase -     Gamma GT -     C-reactive protein -     Helicobacter Pylori Special Antigen, Stool; Future -     US ABDOMEN LIMITED RUQ (LIVER/GB); Future -     traMADol HCl; Take 1 tablet (50 mg total) by mouth every 8 (eight) hours as needed for up to 3 days.  Dispense: 9 tablet; Refill: 0  Hepatomegaly Assessment & Plan: Liver enlarged on CT scan, but liver enzymes normal. No mention of fatty liver on imaging. -Consider further evaluation with ultrasound and possible referral to GI specialist. -Check GGT   Essential hypertension Assessment & Plan: Has improved since switching to Indapamide and CCB Refill Telmisartan 80 mg daily Refill Amlodipine 2.5 mg at night Refill Indapamide 2.5 mg daily Monitor BP at home, goal <140/90   Orders: -     Indapamide; Take 1 tablet (2.5 mg total) by mouth daily.  Dispense: 90 tablet; Refill: 3 -     amLODIPine Besylate; Take 1 tablet (2.5 mg total) by mouth daily.  Dispense: 90 tablet; Refill: 3 -     Telmisartan; Take 1 tablet (80 mg total) by mouth at bedtime.  Dispense: 90 tablet;  Refill: 3  Irritable bowel syndrome with constipation Assessment & Plan: Managed with Linzess. -Continue Linzess. Monitor bowel habits.   Mood disorder (HCC) Assessment & Plan: Improved since starting Rexulti.  Tolerating well Continue Wellbutrin 200 mg daily  and 100 mg p.m. Continue Prozac 40 mg daily       PKD (polycystic kidney disease) Assessment & Plan: Benign left renal cyst noted on CT scan. History of cystic kidney disease. Mild decrease in kidney function. -Monitor kidney function. Consider referral to nephrologist if kidney function continues to decrease.   Hordeolum internum of left lower eyelid Assessment & Plan: Tender lesion under the eye. -Warm compresses frequently throughout day     PDMP reviewed  Return if symptoms worsen or fail to improve, for PCP.  Dana Allan, MD

## 2024-01-06 NOTE — Assessment & Plan Note (Signed)
Benign left renal cyst noted on CT scan. History of cystic kidney disease. Mild decrease in kidney function. -Monitor kidney function. Consider referral to nephrologist if kidney function continues to decrease.

## 2024-01-06 NOTE — Assessment & Plan Note (Signed)
Improved since starting Rexulti. Tolerating well Continue Wellbutrin 200 mg daily  and 100 mg p.m. Continue Prozac 40 mg daily

## 2024-01-06 NOTE — Assessment & Plan Note (Signed)
Tender lesion under the eye. -Warm compresses frequently throughout day

## 2024-01-06 NOTE — Assessment & Plan Note (Signed)
Liver enlarged on CT scan, but liver enzymes normal. No mention of fatty liver on imaging. -Consider further evaluation with ultrasound and possible referral to GI specialist. -Check GGT

## 2024-01-06 NOTE — Patient Instructions (Addendum)
It was a pleasure meeting you today. Thank you for allowing me to take part in your health care.  Our goals for today as we discussed include:  Ultrasound ordered    Tramadol 50 mg every 8 hours for pain.  Use only as needed.    Follow up if no improvement  Warm compresses to right eye frequently throughout the day, If no improvement in 2-4 weeks follow up with PCP   This is a list of the screening recommended for you and due dates:  Health Maintenance  Topic Date Due   Pneumococcal Vaccination (1 of 2 - PCV) Never done   Cologuard (Stool DNA test)  Never done   Zoster (Shingles) Vaccine (1 of 2) Never done   Pap with HPV screening  08/27/2023   Mammogram  09/26/2023   Flu Shot  02/23/2024*   Medicare Annual Wellness Visit  11/02/2024   DTaP/Tdap/Td vaccine (2 - Td or Tdap) 06/18/2028   Hepatitis C Screening  Completed   HIV Screening  Completed   HPV Vaccine  Aged Out   COVID-19 Vaccine  Discontinued  *Topic was postponed. The date shown is not the original due date.    If you have any questions or concerns, please do not hesitate to call the office at [phone].  I look forward to our next visit and until then take care and stay safe.  Regards,   Dana Allan, MD   [facility]

## 2024-01-06 NOTE — Assessment & Plan Note (Signed)
Managed with Linzess. -Continue Linzess. Monitor bowel habits.

## 2024-01-06 NOTE — Assessment & Plan Note (Signed)
Chronic pain, now constant and radiating to the back. No clear etiology on CT scan. No gallstones, pancreatitis, or bowel obstruction noted. Hepatomegaly present but liver enzymes normal. -Order right upper quadrant ultrasound to further evaluate for gallstones -Consider GI referral if no improvement -Tramadol 50 mg q8h prn x 3 days

## 2024-01-06 NOTE — Assessment & Plan Note (Signed)
Has improved since switching to Indapamide and CCB Refill Telmisartan 80 mg daily Refill Amlodipine 2.5 mg at night Refill Indapamide 2.5 mg daily Monitor BP at home, goal <140/90

## 2024-01-09 ENCOUNTER — Ambulatory Visit
Admission: RE | Admit: 2024-01-09 | Discharge: 2024-01-09 | Disposition: A | Discharge status: Home / Self Care | Payer: 59 | Source: Ambulatory Visit | Attending: Family Medicine | Admitting: Family Medicine

## 2024-01-09 DIAGNOSIS — R1011 Right upper quadrant pain: Secondary | ICD-10-CM | POA: Diagnosis present

## 2024-01-14 ENCOUNTER — Encounter: Payer: Self-pay | Admitting: *Deleted

## 2024-01-14 ENCOUNTER — Telehealth: Payer: Self-pay | Admitting: Family Medicine

## 2024-01-14 NOTE — Telephone Encounter (Signed)
 Left message asking the patient to call the office to reschedule his lab appointment from 01/15/24

## 2024-01-15 ENCOUNTER — Other Ambulatory Visit: Payer: 59

## 2024-01-19 ENCOUNTER — Other Ambulatory Visit: Payer: Self-pay | Admitting: Family Medicine

## 2024-01-19 DIAGNOSIS — M62838 Other muscle spasm: Secondary | ICD-10-CM

## 2024-01-27 ENCOUNTER — Ambulatory Visit: Payer: 59 | Admitting: Family Medicine

## 2024-02-04 ENCOUNTER — Ambulatory Visit: Admitting: Family Medicine

## 2024-02-11 ENCOUNTER — Ambulatory Visit (INDEPENDENT_AMBULATORY_CARE_PROVIDER_SITE_OTHER): Admitting: Family Medicine

## 2024-02-11 ENCOUNTER — Encounter: Payer: Self-pay | Admitting: Family Medicine

## 2024-02-11 VITALS — BP 130/88 | HR 77 | Temp 98.0°F | Resp 20 | Ht 64.0 in | Wt 376.1 lb

## 2024-02-11 DIAGNOSIS — E538 Deficiency of other specified B group vitamins: Secondary | ICD-10-CM

## 2024-02-11 DIAGNOSIS — R600 Localized edema: Secondary | ICD-10-CM

## 2024-02-11 DIAGNOSIS — K219 Gastro-esophageal reflux disease without esophagitis: Secondary | ICD-10-CM

## 2024-02-11 DIAGNOSIS — I10 Essential (primary) hypertension: Secondary | ICD-10-CM

## 2024-02-11 DIAGNOSIS — E782 Mixed hyperlipidemia: Secondary | ICD-10-CM | POA: Diagnosis not present

## 2024-02-11 DIAGNOSIS — G894 Chronic pain syndrome: Secondary | ICD-10-CM | POA: Diagnosis not present

## 2024-02-11 DIAGNOSIS — F39 Unspecified mood [affective] disorder: Secondary | ICD-10-CM | POA: Diagnosis not present

## 2024-02-11 DIAGNOSIS — Z1231 Encounter for screening mammogram for malignant neoplasm of breast: Secondary | ICD-10-CM

## 2024-02-11 DIAGNOSIS — Z6841 Body Mass Index (BMI) 40.0 and over, adult: Secondary | ICD-10-CM

## 2024-02-11 DIAGNOSIS — R7309 Other abnormal glucose: Secondary | ICD-10-CM

## 2024-02-11 DIAGNOSIS — I872 Venous insufficiency (chronic) (peripheral): Secondary | ICD-10-CM

## 2024-02-11 DIAGNOSIS — E559 Vitamin D deficiency, unspecified: Secondary | ICD-10-CM

## 2024-02-11 DIAGNOSIS — M064 Inflammatory polyarthropathy: Secondary | ICD-10-CM

## 2024-02-11 MED ORDER — BREXPIPRAZOLE 0.5 MG PO TABS: 1.0000 mg | ORAL_TABLET | Freq: Every day | ORAL | 3 refills | Status: DC

## 2024-02-11 MED ORDER — RALOXIFENE HCL 60 MG PO TABS: 60.0000 mg | ORAL_TABLET | Freq: Every day | ORAL | 3 refills | Status: DC

## 2024-02-11 MED ORDER — OMEPRAZOLE 20 MG PO CPDR: 20.0000 mg | DELAYED_RELEASE_CAPSULE | Freq: Every day | ORAL | 3 refills | Status: DC

## 2024-02-11 MED ORDER — SPIRONOLACTONE 25 MG PO TABS: 25.0000 mg | ORAL_TABLET | Freq: Every day | ORAL | 3 refills | Status: DC

## 2024-02-11 MED ORDER — FLUOXETINE HCL 40 MG PO CAPS: 40.0000 mg | ORAL_CAPSULE | Freq: Every day | ORAL | 3 refills | Status: DC

## 2024-02-11 MED ORDER — SEMAGLUTIDE-WEIGHT MANAGEMENT 0.25 MG/0.5ML ~~LOC~~ SOAJ: 0.2500 mg | SUBCUTANEOUS | 0 refills | Status: AC

## 2024-02-11 MED ORDER — INDAPAMIDE 2.5 MG PO TABS
2.5000 mg | ORAL_TABLET | Freq: Every day | ORAL | 3 refills | Status: DC
Start: 2024-02-11 — End: 2024-03-02

## 2024-02-11 NOTE — Progress Notes (Signed)
 SUBJECTIVE:   Chief Complaint  Patient presents with   Medical Management of Chronic Issues    4 week follow up   HPI Follow up for chronic disease management Discussed the use of AI scribe software for clinical note transcription with the patient, who gave verbal consent to proceed.  History of Present Illness Erika Hanson "Erika Hanson" is a 58 year old female with hypertension who presents with swelling and pain.  She has been experiencing swelling and pain, particularly in her knee, which she believes is causing her discomfort. The pain became severe when the swelling began a couple of weeks ago. She suspects that amlodipine, which she has been taking for blood pressure, may be contributing to the swelling. Previously, she was on hydrochlorothiazide, which seemed to prevent swelling, but it was discontinued.  Her blood pressure at home is around 130/88 mmHg. She is currently on indapamide and Micardis for blood pressure management.  For mental health, she is on Rexulti 0.5 mg for anxiety and anger. She is also taking Wellbutrin, two tablets of 300 mg in the morning and one at night, but is experiencing difficulty staying asleep.  She is using Vista for pain management, which she finds comparable to Celebrex. She also takes Zanaflex at night for muscle relaxation, which she reports helps with her muscles. Additionally, she is on Flonase and Prozac, and does not require refills for these medications at this time.      PERTINENT PMH / PSH: As above  OBJECTIVE:  BP 130/88   Pulse 77   Temp 98 F (36.7 C)   Resp 20   Ht 5\' 4"  (1.626 m)   Wt (!) 376 lb 2 oz (170.6 kg)   LMP 05/21/2016   SpO2 99%   BMI 64.56 kg/m    Physical Exam Vitals reviewed.  Constitutional:      General: She is not in acute distress.    Appearance: Normal appearance. She is obese. She is not ill-appearing, toxic-appearing or diaphoretic.  Eyes:     General:        Right eye: No discharge.         Left eye: No discharge.     Conjunctiva/sclera: Conjunctivae normal.  Cardiovascular:     Rate and Rhythm: Normal rate and regular rhythm.     Heart sounds: Normal heart sounds.  Pulmonary:     Effort: Pulmonary effort is normal.     Breath sounds: Normal breath sounds.  Abdominal:     General: Bowel sounds are normal.  Musculoskeletal:        General: Normal range of motion.     Right lower leg: Edema present.     Left lower leg: Edema present.  Skin:    General: Skin is warm and dry.  Neurological:     General: No focal deficit present.     Mental Status: She is alert and oriented to person, place, and time. Mental status is at baseline.  Psychiatric:        Mood and Affect: Mood normal.        Behavior: Behavior normal.        Thought Content: Thought content normal.        Judgment: Judgment normal.           02/11/2024    8:49 AM 12/29/2023    8:12 AM 11/03/2023    3:13 PM 06/25/2023    8:57 AM 03/06/2023    9:43 AM  Depression screen PHQ  2/9  Decreased Interest 0 1 2 1  0  Down, Depressed, Hopeless 0 1 0 1 0  PHQ - 2 Score 0 2 2 2  0  Altered sleeping 1 2 1 1    Tired, decreased energy 0 2 1 1    Change in appetite 0 1 0 0   Feeling bad or failure about yourself  0 1 0 0   Trouble concentrating 0 0 1 0   Moving slowly or fidgety/restless 0 0 0 0   Suicidal thoughts 0 1 0 0   PHQ-9 Score 1 9 5 4    Difficult doing work/chores Not difficult at all Somewhat difficult Somewhat difficult Somewhat difficult       02/11/2024    8:49 AM 12/29/2023    8:12 AM 06/25/2023    8:57 AM 03/06/2023    9:43 AM  GAD 7 : Generalized Anxiety Score  Nervous, Anxious, on Edge 0 2 2 0  Control/stop worrying 0 2 2 0  Worry too much - different things 0 2 2 0  Trouble relaxing 0 1 1 0  Restless 0 1 1 0  Easily annoyed or irritable 1 2 3  0  Afraid - awful might happen 0 1 1 0  Total GAD 7 Score 1 11 12  0  Anxiety Difficulty Not difficult at all Somewhat difficult Somewhat difficult Not  difficult at all    ASSESSMENT/PLAN:  Mood disorder (HCC) Assessment & Plan: Difficulty staying asleep, possibly due to nighttime Wellbutrin. -Stop Wellbutrin at night. - Continue taking two tablets of Wellbutrin 300 mg in the morning. - Increase Rexulti to 1 mg daily - Continue Prozac 40 mg daily - Follow up if no improvement  Orders: -     Brexpiprazole; Take 2 tablets (1 mg total) by mouth daily.  Dispense: 180 tablet; Refill: 3 -     FLUoxetine HCl; Take 1 capsule (40 mg total) by mouth daily.  Dispense: 90 capsule; Refill: 3  Essential hypertension Assessment & Plan: Elevated BP Refill Telmisartan 80 mg daily Discontinue Amlodipine due to peripheral edema Start Aldactone 25 mg daily Refill Indapamide 2.5 mg daily Monitor BP at home, goal <140/90   Orders: -     Semaglutide-Weight Management; Inject 0.25 mg into the skin once a week for 28 days.  Dispense: 2 mL; Refill: 0 -     Indapamide; Take 1 tablet (2.5 mg total) by mouth daily.  Dispense: 90 tablet; Refill: 3 -     Spironolactone; Take 1 tablet (25 mg total) by mouth daily.  Dispense: 90 tablet; Refill: 3 -     Comprehensive metabolic panel; Future  Chronic pain syndrome Assessment & Plan: Current regimen of Celebrex 200mg  daily not optimal due to potential impact on blood pressure. Discussed alternative options. -Start Nabumetone 500 mg BID prn for pain management. -Refill Zanaflex -Discuss with rheumatologist about potential alternatives for pain management.  Orders: -     Nabumetone; Take 1 tablet (500 mg total) by mouth 2 (two) times daily.  Dispense: 60 tablet; Refill: 11  Mixed hyperlipidemia -     Semaglutide-Weight Management; Inject 0.25 mg into the skin once a week for 28 days.  Dispense: 2 mL; Refill: 0 -     Lipid panel; Future  Morbid obesity with BMI of 60.0-69.9, adult (HCC) Assessment & Plan: Elevated BMI  Difficult to exercise secondary to chronic pain Encouraged healthy diet and exercise  as tolerated -Start Wegovy 0.25 mg weekly -Follow up in 4 weeks  Orders: -  Semaglutide-Weight Management; Inject 0.25 mg into the skin once a week for 28 days.  Dispense: 2 mL; Refill: 0  Breast cancer screening by mammogram -     3D Screening Mammogram, Left and Right; Future  Gastroesophageal reflux disease, unspecified whether esophagitis present -     Omeprazole; Take 1 capsule (20 mg total) by mouth at bedtime.  Dispense: 90 capsule; Refill: 3  Vitamin D deficiency -     VITAMIN D 25 Hydroxy (Vit-D Deficiency, Fractures); Future  Vitamin B 12 deficiency -     Vitamin B12; Future  Abnormal glucose -     Hemoglobin A1c; Future  Edema of both lower legs due to peripheral venous insufficiency Assessment & Plan: Bilateral and chronic.  Has worsened with addition of CCB -Stop Amlodipine -Trial Aldactone -Recommend Compression stockings -Would benefit from weight loss     Inflammatory polyarthritis (HCC) Assessment & Plan: Chronic.  Currently on Cimzia and tolerating well. Continues to have pain in bilateral ankles and knees Stop Amlodipine to decrease peripheral edema Follows with Dr. Dierdre Forth    PDMP reviewed  Return in about 4 weeks (around 03/10/2024) for PCP.  Dana Allan, MD

## 2024-02-11 NOTE — Patient Instructions (Addendum)
 It was a pleasure meeting you today. Thank you for allowing me to take part in your health care.  Our goals for today as we discussed include:  Stop Amlodiopine Start Aldactone 25 mg daily Continue all other blood pressure medications  Increase Rexulti to 1 mg daily Decrease Wellbutrin to 200 mg in am.  Stop dose at night  Refills sent for requested medications  Start Wegovy 0.25 mg weekly Follow up in 4 weeks   If you have any questions or concerns, please do not hesitate to call the office at [phone].  I look forward to our next visit and until then take care and stay safe.  Regards,   Dana Allan, MD   [facility]

## 2024-02-15 ENCOUNTER — Encounter: Payer: Self-pay | Admitting: Family Medicine

## 2024-02-16 ENCOUNTER — Encounter: Payer: Self-pay | Admitting: Family Medicine

## 2024-02-16 DIAGNOSIS — E538 Deficiency of other specified B group vitamins: Secondary | ICD-10-CM | POA: Insufficient documentation

## 2024-02-16 DIAGNOSIS — I872 Venous insufficiency (chronic) (peripheral): Secondary | ICD-10-CM | POA: Insufficient documentation

## 2024-02-16 DIAGNOSIS — R7309 Other abnormal glucose: Secondary | ICD-10-CM | POA: Insufficient documentation

## 2024-02-16 HISTORY — DX: Venous insufficiency (chronic) (peripheral): I87.2

## 2024-02-16 MED ORDER — NABUMETONE 500 MG PO TABS: 500.0000 mg | ORAL_TABLET | Freq: Two times a day (BID) | ORAL | 11 refills | Status: DC

## 2024-02-16 NOTE — Telephone Encounter (Signed)
 noted

## 2024-02-16 NOTE — Assessment & Plan Note (Signed)
 Bilateral and chronic.  Has worsened with addition of CCB -Stop Amlodipine -Trial Aldactone -Recommend Compression stockings -Would benefit from weight loss

## 2024-02-16 NOTE — Assessment & Plan Note (Signed)
 Difficulty staying asleep, possibly due to nighttime Wellbutrin. -Stop Wellbutrin at night. - Continue taking two tablets of Wellbutrin 300 mg in the morning. - Increase Rexulti to 1 mg daily - Continue Prozac 40 mg daily - Follow up if no improvement

## 2024-02-16 NOTE — Assessment & Plan Note (Addendum)
 Chronic.  Currently on Cimzia and tolerating well. Continues to have pain in bilateral ankles and knees Stop Amlodipine to decrease peripheral edema Follows with Dr. Dierdre Forth

## 2024-02-16 NOTE — Assessment & Plan Note (Signed)
 Elevated BP Refill Telmisartan 80 mg daily Discontinue Amlodipine due to peripheral edema Start Aldactone 25 mg daily Refill Indapamide 2.5 mg daily Monitor BP at home, goal <140/90

## 2024-02-16 NOTE — Assessment & Plan Note (Addendum)
 Current regimen of Celebrex 200mg  daily not optimal due to potential impact on blood pressure. Discussed alternative options. -Start Nabumetone 500 mg BID prn for pain management. -Refill Zanaflex -Discuss with rheumatologist about potential alternatives for pain management.

## 2024-02-16 NOTE — Assessment & Plan Note (Signed)
 Elevated BMI  Difficult to exercise secondary to chronic pain Encouraged healthy diet and exercise as tolerated -Start Wegovy 0.25 mg weekly -Follow up in 4 weeks

## 2024-02-17 ENCOUNTER — Other Ambulatory Visit: Payer: Self-pay | Admitting: Family Medicine

## 2024-02-17 DIAGNOSIS — F39 Unspecified mood [affective] disorder: Secondary | ICD-10-CM

## 2024-02-18 ENCOUNTER — Telehealth: Payer: Self-pay

## 2024-02-18 ENCOUNTER — Other Ambulatory Visit

## 2024-02-18 NOTE — Telephone Encounter (Signed)
 Copied from CRM [id number]. Topic: Clinical - Prescription Issue >> Feb 18, 2024  1:06 PM Sim Boast F wrote: Reason for CRM: Patient called requesting prior authorization on Wegovy, says the pharmacy is waiting for it. Requested a call back with an update

## 2024-02-18 NOTE — Telephone Encounter (Signed)
 [facility] needs PA

## 2024-02-19 ENCOUNTER — Telehealth: Payer: Self-pay | Admitting: Pharmacy Technician

## 2024-02-19 ENCOUNTER — Other Ambulatory Visit (HOSPITAL_COMMUNITY): Payer: Self-pay

## 2024-02-19 NOTE — Telephone Encounter (Signed)
 Pharmacy Patient Advocate Encounter   Received notification from Pt Calls Messages that prior authorization for [facility] 0.25MG  is required/requested.   Insurance verification completed.   The patient is insured through [employer] .   Per test claim: Product/Service Not Covered - Plan Benefit Exclusion

## 2024-02-19 NOTE — Telephone Encounter (Signed)
 Left message to return call to our office.  Okay to relay message to pt that medication was not approved by insurance. Please document when spoke to.

## 2024-02-23 ENCOUNTER — Encounter: Payer: Self-pay | Admitting: Family Medicine

## 2024-02-23 NOTE — Telephone Encounter (Signed)
 Noted.

## 2024-02-23 NOTE — Telephone Encounter (Unsigned)
 Copied from CRM [id number]. Topic: General - Call Back - No Documentation >> Feb 23, 2024 11:43 AM Alcus Dad wrote: Reason for CRM: Patient missed a call from nurse. I informed patient of notes left by nurse. Patient is aware that her insurance did not approve medication [facility])

## 2024-02-24 ENCOUNTER — Telehealth: Payer: Self-pay

## 2024-02-24 ENCOUNTER — Encounter: Payer: Self-pay | Admitting: Family Medicine

## 2024-02-24 ENCOUNTER — Other Ambulatory Visit (HOSPITAL_COMMUNITY): Payer: Self-pay

## 2024-02-24 NOTE — Telephone Encounter (Signed)
 Pharmacy Patient Advocate Encounter   Received notification from CoverMyMeds that prior authorization for [facility] 0.25MG /0.5ML auto-injectors is required/requested.   Insurance verification completed.   The patient is insured through [facility] .   Per test claim: PA required; PA submitted to above mentioned insurance via CoverMyMeds Key/confirmation #/EOC [id number] Status is pending

## 2024-02-24 NOTE — Telephone Encounter (Signed)
 Pharmacy Patient Advocate Encounter   Received notification from CoverMyMeds that prior authorization for Rexulti 0.5MG  tablets is required/requested.   Insurance verification completed.   The patient is insured through [facility] .   Per test claim: PA required; PA submitted to above mentioned insurance via CoverMyMeds Key/confirmation #/EOC [id number] Status is pending

## 2024-02-25 ENCOUNTER — Other Ambulatory Visit (HOSPITAL_COMMUNITY): Payer: Self-pay

## 2024-02-25 ENCOUNTER — Telehealth: Payer: Self-pay

## 2024-02-25 NOTE — Telephone Encounter (Signed)
 Copied from CRM [id number]. Topic: General - Other >> Feb 25, 2024 10:56 AM Fredrich Romans wrote: Reason for CRM: [employer] called asking for additional clinical information on behalf of medication Rexulti. By April 4th 7 am  is requested to be sent Cb#: [phone] Cse#: [id number]

## 2024-02-25 NOTE — Telephone Encounter (Signed)
 Pt was informed and said she would call the insurance to see why it was not approved, as it is on her list of covered medication.

## 2024-02-25 NOTE — Telephone Encounter (Signed)
 Pharmacy Patient Advocate Encounter  Received notification from [facility] that Prior Authorization for Wegovy 0.25MG /0.5ML auto-injectors  has been DENIED.due to to Plan Exclusion.  For further questions, call [phone].   PA #/Case ID/Reference #: [id number]

## 2024-02-25 NOTE — Telephone Encounter (Signed)
Called pt and informed her of this information.

## 2024-02-26 ENCOUNTER — Other Ambulatory Visit: Payer: Self-pay | Admitting: Family Medicine

## 2024-02-26 ENCOUNTER — Other Ambulatory Visit: Payer: Self-pay | Admitting: Pharmacist

## 2024-02-26 ENCOUNTER — Other Ambulatory Visit (HOSPITAL_COMMUNITY): Payer: Self-pay

## 2024-02-26 ENCOUNTER — Telehealth: Payer: Self-pay

## 2024-02-26 DIAGNOSIS — F39 Unspecified mood [affective] disorder: Secondary | ICD-10-CM

## 2024-02-26 MED ORDER — BREXPIPRAZOLE 1 MG PO TABS: 1.0000 mg | ORAL_TABLET | Freq: Every day | ORAL | 11 refills | Status: DC

## 2024-02-26 NOTE — Progress Notes (Signed)
 Chart Review Reason for documentation: Medication Access/Coverage  Summary: Concerns for denial of coverage for GLP1 for weight loss indication   Prescription insurance: YES Payor: [employer] MEDICARE / Plan: Suan Halter DUAL COMPLETE / Product Type: *No Product type* /   Dual coverage: Medicare/Medicaid (Medicaid # [id number])  Medication prescribed: [id number]   Medicare does not cover weight loss medications though Medicaid now does.    Follow Up: Patient given direct line for further questions/concerns.  Loree Fee, PharmD Clinical Pharmacist [facility] [phone]

## 2024-02-26 NOTE — Telephone Encounter (Signed)
 Additional information has been requested from the patient's insurance in order to proceed with the prior authorization request.      Ran test claim for 1MG  tablets (one daily). Received paid claim. Patient's copay is $0 for 30 day supply.

## 2024-02-26 NOTE — Telephone Encounter (Signed)
 Pharmacy Patient Advocate Encounter  Insurance verification completed.   The patient is insured through [facility] MEDICAID   Ran test claim for [facility] Currently a quantity of 2ml is a 28 day supply and the co-pay is requires PA  This test claim was processed through [facility]- copay amounts may vary at other pharmacies due to pharmacy/plan contracts, or as the patient moves through the different stages of their insurance plan.

## 2024-02-27 NOTE — Telephone Encounter (Signed)
 PA request has been Submitted. New Encounter has been or will be created for follow up. For additional info see Pharmacy Prior Auth telephone encounter from 02/24/24.

## 2024-02-27 NOTE — Telephone Encounter (Signed)
 Noted.

## 2024-03-02 ENCOUNTER — Other Ambulatory Visit (HOSPITAL_COMMUNITY): Payer: Self-pay

## 2024-03-02 ENCOUNTER — Other Ambulatory Visit: Payer: Self-pay | Admitting: Family Medicine

## 2024-03-02 ENCOUNTER — Telehealth: Payer: Self-pay

## 2024-03-02 DIAGNOSIS — I10 Essential (primary) hypertension: Secondary | ICD-10-CM

## 2024-03-02 NOTE — Telephone Encounter (Signed)
 Rexulti 0.5mg  order changed to the Rexulti 1mg  tablet.

## 2024-03-02 NOTE — Telephone Encounter (Signed)
 Pharmacy Patient Advocate Encounter   Received notification from Physician's Office that prior authorization for [facility] is required/requested.   Insurance verification completed.   The patient is insured through [facility] MEDICAID .   Per test claim: PA required; PA submitted to above mentioned insurance via Fax Key/confirmation #/EOC --- Status is pending   FAX: [phone] PHONE: [phone]

## 2024-03-02 NOTE — Telephone Encounter (Signed)
-----   Message from Loree Fee sent at 02/26/2024 11:09 AM EDT ----- Can we please submit a PA specifically under her Medicaid for [facility]? Thank you!!

## 2024-03-03 ENCOUNTER — Other Ambulatory Visit (HOSPITAL_COMMUNITY): Payer: Self-pay

## 2024-03-05 ENCOUNTER — Other Ambulatory Visit (HOSPITAL_COMMUNITY): Payer: Self-pay

## 2024-03-05 NOTE — Telephone Encounter (Signed)
 Called and informed pt of this information also sent a mychart message.

## 2024-03-05 NOTE — Telephone Encounter (Signed)
 Pharmacy Patient Advocate Encounter  Received notification from [facility] MEDICAID that Prior Authorization for [facility] has been APPROVED from 03/02/24 to 09/01/24. Ran test claim, Copay is $4. This test claim was processed through [facility]- copay amounts may vary at other pharmacies due to pharmacy/plan contracts, or as the patient moves through the different stages of their insurance plan.   PA #/Case ID/Reference #: [id number]

## 2024-03-10 ENCOUNTER — Encounter: Payer: Self-pay | Admitting: Family Medicine

## 2024-03-10 ENCOUNTER — Ambulatory Visit: Admitting: Family Medicine

## 2024-03-10 VITALS — BP 121/78 | HR 73 | Temp 97.7°F | Resp 20 | Ht 64.0 in | Wt 383.0 lb

## 2024-03-10 DIAGNOSIS — E782 Mixed hyperlipidemia: Secondary | ICD-10-CM | POA: Diagnosis not present

## 2024-03-10 DIAGNOSIS — F39 Unspecified mood [affective] disorder: Secondary | ICD-10-CM | POA: Diagnosis not present

## 2024-03-10 DIAGNOSIS — I872 Venous insufficiency (chronic) (peripheral): Secondary | ICD-10-CM

## 2024-03-10 DIAGNOSIS — R7309 Other abnormal glucose: Secondary | ICD-10-CM | POA: Diagnosis not present

## 2024-03-10 DIAGNOSIS — Z6841 Body Mass Index (BMI) 40.0 and over, adult: Secondary | ICD-10-CM | POA: Diagnosis not present

## 2024-03-10 DIAGNOSIS — E538 Deficiency of other specified B group vitamins: Secondary | ICD-10-CM | POA: Diagnosis not present

## 2024-03-10 DIAGNOSIS — E559 Vitamin D deficiency, unspecified: Secondary | ICD-10-CM | POA: Diagnosis not present

## 2024-03-10 DIAGNOSIS — R600 Localized edema: Secondary | ICD-10-CM | POA: Diagnosis not present

## 2024-03-10 DIAGNOSIS — M064 Inflammatory polyarthropathy: Secondary | ICD-10-CM

## 2024-03-10 DIAGNOSIS — M2550 Pain in unspecified joint: Secondary | ICD-10-CM

## 2024-03-10 DIAGNOSIS — G894 Chronic pain syndrome: Secondary | ICD-10-CM

## 2024-03-10 DIAGNOSIS — I10 Essential (primary) hypertension: Secondary | ICD-10-CM

## 2024-03-10 LAB — LIPID PANEL
Cholesterol: 268 mg/dL — ABNORMAL HIGH (ref 0–200)
HDL: 52.2 mg/dL (ref >39.00)
LDL Cholesterol: 181 mg/dL — ABNORMAL HIGH (ref 0–99)
NonHDL: 215.79
Total CHOL/HDL Ratio: 5
Triglycerides: 174 mg/dL — ABNORMAL HIGH (ref 0.0–149.0)
VLDL: 34.8 mg/dL (ref 0.0–40.0)

## 2024-03-10 LAB — CBC WITH DIFFERENTIAL/PLATELET
Basophils Absolute: 0 10*3/uL (ref 0.0–0.1)
Basophils Relative: 0.6 % (ref 0.0–3.0)
Eosinophils Absolute: 0.4 10*3/uL (ref 0.0–0.7)
Eosinophils Relative: 6.5 % — ABNORMAL HIGH (ref 0.0–5.0)
HCT: 41.8 % (ref 36.0–46.0)
Hemoglobin: 13.7 g/dL (ref 12.0–15.0)
Lymphocytes Relative: 22.4 % (ref 12.0–46.0)
Lymphs Abs: 1.3 10*3/uL (ref 0.7–4.0)
MCHC: 32.8 g/dL (ref 30.0–36.0)
MCV: 87.9 fl (ref 78.0–100.0)
Monocytes Absolute: 0.5 10*3/uL (ref 0.1–1.0)
Monocytes Relative: 8.4 % (ref 3.0–12.0)
Neutro Abs: 3.6 10*3/uL (ref 1.4–7.7)
Neutrophils Relative %: 62.1 % (ref 43.0–77.0)
Platelets: 283 10*3/uL (ref 150.0–400.0)
RBC: 4.76 Mil/uL (ref 3.87–5.11)
RDW: 14.5 % (ref 11.5–15.5)
WBC: 5.8 10*3/uL (ref 4.0–10.5)

## 2024-03-10 LAB — COMPREHENSIVE METABOLIC PANEL WITH GFR
ALT: 28 U/L (ref 0–35)
AST: 22 U/L (ref 0–37)
Albumin: 4.1 g/dL (ref 3.5–5.2)
Alkaline Phosphatase: 137 U/L — ABNORMAL HIGH (ref 39–117)
BUN: 21 mg/dL (ref 6–23)
CO2: 25 meq/L (ref 19–32)
Calcium: 9.4 mg/dL (ref 8.4–10.5)
Chloride: 101 meq/L (ref 96–112)
Creatinine, Ser: 1.3 mg/dL — ABNORMAL HIGH (ref 0.40–1.20)
GFR: 46.19 mL/min — ABNORMAL LOW (ref >60.00)
Glucose, Bld: 113 mg/dL — ABNORMAL HIGH (ref 70–99)
Potassium: 4.6 meq/L (ref 3.5–5.1)
Sodium: 136 meq/L (ref 135–145)
Total Bilirubin: 0.3 mg/dL (ref 0.2–1.2)
Total Protein: 6.9 g/dL (ref 6.0–8.3)

## 2024-03-10 LAB — VITAMIN B12: Vitamin B-12: 464 pg/mL (ref 211–911)

## 2024-03-10 LAB — HEMOGLOBIN A1C: Hgb A1c MFr Bld: 6.4 % (ref 4.6–6.5)

## 2024-03-10 LAB — VITAMIN D 25 HYDROXY (VIT D DEFICIENCY, FRACTURES): VITD: 46.71 ng/mL (ref 30.00–100.00)

## 2024-03-10 LAB — TSH: TSH: 3.27 u[IU]/mL (ref 0.35–5.50)

## 2024-03-10 MED ORDER — BUPROPION HCL ER (SR) 100 MG PO TB12: 200.0000 mg | ORAL_TABLET | Freq: Every day | ORAL | Status: DC

## 2024-03-10 MED ORDER — SPIRONOLACTONE 50 MG PO TABS: 50.0000 mg | ORAL_TABLET | Freq: Every day | ORAL | 3 refills | Status: DC

## 2024-03-10 MED ORDER — BREXPIPRAZOLE 2 MG PO TABS: 2.0000 mg | ORAL_TABLET | Freq: Every day | ORAL | 11 refills | Status: DC

## 2024-03-10 MED ORDER — CELECOXIB 100 MG PO CAPS: 100.0000 mg | ORAL_CAPSULE | Freq: Two times a day (BID) | ORAL | 3 refills | Status: DC

## 2024-03-10 NOTE — Progress Notes (Signed)
 "  SUBJECTIVE:   Chief Complaint  Patient presents with   Medical Management of Chronic Issues    4 week follow    HPI Presents for follow up chronic disease management  Discussed the use of AI scribe software for clinical note transcription with the patient, who gave verbal consent to proceed.  History of Present Illness Erika Hanson is a 58 year old female with hypertension and arthritis who presents for follow-up regarding blood pressure management and medication side effects.  She has experienced increased swelling after discontinuing amlodipine  a few weeks ago due to previous swelling issues. Currently, she is experiencing more swelling, which she associates with the use of Relafen  for arthritis pain. Relafen  has not been effective for pain relief, and she prefers Celebrex , which she has used previously without swelling issues. Her home blood pressure readings are around 140/80-85 mmHg, indicating the systolic pressure remains elevated.  She has been on Celebrex  100 mg twice daily in the past, which she found effective for pain management without causing swelling. She is currently on indapamide , a diuretic, at the highest dose, and has not been on Lasix. She is considering a switch to chlorthalidone for better blood pressure control.  She recently started Wegovy  for weight management and has had one dose. She is on Rexulti  1 mg for four weeks and is considering increasing the dose. She takes Wellbutrin  twice in the morning, which has not significantly aided in weight loss. She is on Aldactone  25 mg, which is planned to be increased to 50 mg to assist with blood pressure control.  She experiences swelling in her wrists and hands, which she describes as uncomfortable. The swelling does not resolve with elevation and has persisted for about one and a half to two weeks. No chest pain, but she reports shortness of breath when moving around, which she attributes to fluid retention.  She has no history of congestive heart failure or blood clots. She cannot lie flat due to back issues and uses an adjustable bed.    PERTINENT PMH / PSH: As above  OBJECTIVE:  BP 121/78   Pulse 73   Temp 97.7 F (36.5 C)   Resp 20   Ht 5' 4 (1.626 m)   Wt (!) 383 lb (173.7 kg)   LMP 05/21/2016   SpO2 99%   BMI 65.74 kg/m    Physical Exam Vitals reviewed.  Constitutional:      General: She is not in acute distress.    Appearance: Normal appearance. She is obese. She is not ill-appearing, toxic-appearing or diaphoretic.  Eyes:     General:        Right eye: No discharge.        Left eye: No discharge.     Conjunctiva/sclera: Conjunctivae normal.  Cardiovascular:     Rate and Rhythm: Normal rate and regular rhythm.     Heart sounds: Normal heart sounds.  Pulmonary:     Effort: Pulmonary effort is normal.     Breath sounds: Normal breath sounds.  Abdominal:     General: Bowel sounds are normal.  Musculoskeletal:        General: Swelling and tenderness present. No signs of injury. Normal range of motion.  Skin:    General: Skin is warm and dry.  Neurological:     General: No focal deficit present.     Mental Status: She is alert and oriented to person, place, and time. Mental status is at baseline.  Psychiatric:  Mood and Affect: Mood normal.        Behavior: Behavior normal.        Thought Content: Thought content normal.        Judgment: Judgment normal.           02/11/2024    8:49 AM 12/29/2023    8:12 AM 11/03/2023    3:13 PM 06/25/2023    8:57 AM 03/06/2023    9:43 AM  Depression screen PHQ 2/9  Decreased Interest 0 1 2 1  0  Down, Depressed, Hopeless 0 1 0 1 0  PHQ - 2 Score 0 2 2 2  0  Altered sleeping 1 2 1 1    Tired, decreased energy 0 2 1 1    Change in appetite 0 1 0 0   Feeling bad or failure about yourself  0 1 0 0   Trouble concentrating 0 0 1 0   Moving slowly or fidgety/restless 0 0 0 0   Suicidal thoughts 0 1 0 0   PHQ-9 Score 1 9 5 4     Difficult doing work/chores Not difficult at all Somewhat difficult Somewhat difficult Somewhat difficult       02/11/2024    8:49 AM 12/29/2023    8:12 AM 06/25/2023    8:57 AM 03/06/2023    9:43 AM  GAD 7 : Generalized Anxiety Score  Nervous, Anxious, on Edge 0 2 2 0  Control/stop worrying 0 2 2 0  Worry too much - different things 0 2 2 0  Trouble relaxing 0 1 1 0  Restless 0 1 1 0  Easily annoyed or irritable 1 2 3  0  Afraid - awful might happen 0 1 1 0  Total GAD 7 Score 1 11 12  0  Anxiety Difficulty Not difficult at all Somewhat difficult Somewhat difficult Not difficult at all    ASSESSMENT/PLAN:  Mood disorder [facility]) Assessment & Plan: Managed with Rexulti , Prozac  and Wellbutrin .  - Increase Rexulti  to 2 mg. -Refill Wellbutrin  at current dose, consider reducing if Rexulti  increase is tolerated. - Continue Prozac  40 mg daily - Follow up if no improvement  Orders: -     Brexpiprazole ; Take 1 tablet (2 mg total) by mouth daily.  Dispense: 30 tablet; Refill: 11 -     buPROPion  HCl ER (SR); Take 2 tablets (200 mg total) by mouth daily. TAKE TWO TABLETS BY MOUTH IN THE MORNING.  Morbid obesity with BMI of 60.0-69.9, adult [facility]) Assessment & Plan: Wegovy  initiated for weight management. Dose escalation discussed for effectiveness. Emphasized proper eating habits for weight maintenance post-Wegovy . - Continue Wegovy , increase to 0.5 mg after 3 weeks if tolerated. - Plan to increase to 1 mg by mid-June if tolerated. - Educate on proper eating habits to maintain weight loss.   Essential hypertension Assessment & Plan: Hypertension management complicated by medication-induced edema. Home BP readings slightly elevated. Emphasized weight loss with Wegovy  for long-term BP management. - Discontinue Relafen . - Continue Telmesartan and Indapamide  daily - Increase Aldactone  to 50 mg.   Orders: -     Spironolactone ; Take 1 tablet (50 mg total) by mouth daily.  Dispense: 90 tablet;  Refill: 3 -     Comprehensive metabolic panel with GFR  Inflammatory polyarthritis (HCC) Assessment & Plan: Suboptimal pain management with Relafen . Celebrex  previously effective without edema. - Restart Celebrex  at previous dose.   Chronic pain syndrome Assessment & Plan: Current regimen of Celebrex  200mg  daily not optimal due to potential impact on blood pressure.  Discussed alternative options. -Start Nabumetone  500 mg BID prn for pain management. -Refill Zanaflex  -Discuss with rheumatologist about potential alternatives for pain management.   Pain, joint, multiple sites -     Celecoxib ; Take 1 capsule (100 mg total) by mouth 2 (two) times daily.  Dispense: 180 capsule; Refill: 3  Abnormal glucose -     Hemoglobin A1c  Bilateral lower extremity edema Assessment & Plan: Worsening lower extremity edema. Likely from venous insufficiency and exacerbated by medication and increased weight.  Will check D-Dimer for DVT, but less likely given chronicity and bilateral.  Orders: -     D-dimer, quantitative -     TSH -     CBC with Differential/Platelet  Vitamin D  deficiency -     VITAMIN D  25 Hydroxy (Vit-D Deficiency, Fractures)  Vitamin B12 deficiency -     Vitamin B12  Mixed hyperlipidemia -     Lipid panel  Edema of both lower legs due to peripheral venous insufficiency Assessment & Plan: Persistent edema likely exacerbated by medication-induced, possibly NSAID-related. No CHF or thrombosis.  - Discontinue Relafen . - Restart Celebrex  at previous dose. - Consider imaging of legs and vascular surgery referral if edema persists.     PDMP reviewed  Return in about 8 weeks (around 05/05/2024) for PCP.  Glenys Ferrari, MD "

## 2024-03-10 NOTE — Patient Instructions (Addendum)
 It was a pleasure meeting you today. Thank you for allowing me to take part in your health care.  Our goals for today as we discussed include:  Stop Ralefan Start Celebrex 100 mg two times a day a needed for pain Take Omeprazole daily Elevate legs when able Recommend compression stockings If no improvement can send for imaging  Increase Aldactone from 25 mg to 50 mg daily Continue all other blood pressure medications  Continue Wegovy 0.25 mg weekly.   Notify MD in 3 weeks if tolerating well and will increase dose to 0.5 mg weekly. Follow up 8 weeks  Increase Rexulti from 1 mg to 2 mg daily Continue Wellbutrin 200 mg in the morning.   This is a list of the screening recommended for you and due dates:  Health Maintenance  Topic Date Due   Cologuard (Stool DNA test)  Never done   Pap with HPV screening  08/27/2023   Mammogram  09/26/2023   Zoster (Shingles) Vaccine (1 of 2) 05/13/2024*   Pneumococcal Vaccination (1 of 2 - PCV) 02/10/2025*   Flu Shot  06/25/2024   Medicare Annual Wellness Visit  11/02/2024   DTaP/Tdap/Td vaccine (2 - Td or Tdap) 06/18/2028   Hepatitis C Screening  Completed   HIV Screening  Completed   HPV Vaccine  Aged Out   Meningitis B Vaccine  Aged Out   COVID-19 Vaccine  Discontinued  *Topic was postponed. The date shown is not the original due date.     If you have any questions or concerns, please do not hesitate to call the office at [phone].  I look forward to our next visit and until then take care and stay safe.  Regards,   Valli Gaw, MD   [facility]

## 2024-03-11 LAB — D-DIMER, QUANTITATIVE: D-Dimer, Quant: 0.46 ug{FEU}/mL (ref <0.50)

## 2024-03-13 ENCOUNTER — Other Ambulatory Visit: Payer: Self-pay | Admitting: Family Medicine

## 2024-03-13 DIAGNOSIS — M62838 Other muscle spasm: Secondary | ICD-10-CM

## 2024-03-18 ENCOUNTER — Encounter: Payer: Self-pay | Admitting: Family Medicine

## 2024-03-18 DIAGNOSIS — R600 Localized edema: Secondary | ICD-10-CM | POA: Insufficient documentation

## 2024-03-18 NOTE — Assessment & Plan Note (Signed)
 Persistent edema likely exacerbated by medication-induced, possibly NSAID-related. No CHF or thrombosis.  - Discontinue Relafen . - Restart Celebrex  at previous dose. - Consider imaging of legs and vascular surgery referral if edema persists.

## 2024-03-18 NOTE — Assessment & Plan Note (Signed)
 Hypertension management complicated by medication-induced edema. Home BP readings slightly elevated. Emphasized weight loss with Wegovy  for long-term BP management. - Discontinue Relafen . - Continue Telmesartan and Indapamide  daily - Increase Aldactone  to 50 mg.

## 2024-03-18 NOTE — Assessment & Plan Note (Signed)
 Suboptimal pain management with Relafen . Celebrex  previously effective without edema. - Restart Celebrex  at previous dose.

## 2024-03-18 NOTE — Assessment & Plan Note (Deleted)
 Persistent edema likely medication-induced, possibly NSAID-related. No CHF or thrombosis. Consider imaging and vascular referral if edema persists. - Discontinue Relafen . - Restart Celebrex  at previous dose. - Consider imaging of legs and vascular surgery referral if edema persists.

## 2024-03-18 NOTE — Assessment & Plan Note (Signed)
 Managed with Rexulti , Prozac  and Wellbutrin .  - Increase Rexulti  to 2 mg. -Refill Wellbutrin  at current dose, consider reducing if Rexulti  increase is tolerated. - Continue Prozac  40 mg daily - Follow up if no improvement

## 2024-03-18 NOTE — Assessment & Plan Note (Signed)
 Wegovy  initiated for weight management. Dose escalation discussed for effectiveness. Emphasized proper eating habits for weight maintenance post-Wegovy . - Continue Wegovy , increase to 0.5 mg after 3 weeks if tolerated. - Plan to increase to 1 mg by mid-June if tolerated. - Educate on proper eating habits to maintain weight loss.

## 2024-03-18 NOTE — Assessment & Plan Note (Signed)
 Current regimen of Celebrex 200mg  daily not optimal due to potential impact on blood pressure. Discussed alternative options. -Start Nabumetone 500 mg BID prn for pain management. -Refill Zanaflex -Discuss with rheumatologist about potential alternatives for pain management.

## 2024-03-18 NOTE — Assessment & Plan Note (Signed)
 Worsening lower extremity edema. Likely from venous insufficiency and exacerbated by medication and increased weight.  Will check D-Dimer for DVT, but less likely given chronicity and bilateral.

## 2024-03-22 ENCOUNTER — Other Ambulatory Visit: Payer: Self-pay | Admitting: Family Medicine

## 2024-03-22 DIAGNOSIS — E785 Hyperlipidemia, unspecified: Secondary | ICD-10-CM

## 2024-03-22 DIAGNOSIS — I10 Essential (primary) hypertension: Secondary | ICD-10-CM

## 2024-03-22 MED ORDER — SEMAGLUTIDE-WEIGHT MANAGEMENT 1.7 MG/0.75ML ~~LOC~~ SOAJ
1.7000 mg | SUBCUTANEOUS | 0 refills | Status: DC
Start: 2024-06-17 — End: 2024-05-05

## 2024-03-22 MED ORDER — SEMAGLUTIDE-WEIGHT MANAGEMENT 0.5 MG/0.5ML ~~LOC~~ SOAJ: 0.5000 mg | SUBCUTANEOUS | 0 refills | Status: DC

## 2024-03-22 MED ORDER — SEMAGLUTIDE-WEIGHT MANAGEMENT 2.4 MG/0.75ML ~~LOC~~ SOAJ: 2.4000 mg | SUBCUTANEOUS | 0 refills | Status: DC

## 2024-03-22 MED ORDER — SEMAGLUTIDE-WEIGHT MANAGEMENT 1 MG/0.5ML ~~LOC~~ SOAJ: 1.0000 mg | SUBCUTANEOUS | 0 refills | Status: DC

## 2024-04-01 ENCOUNTER — Encounter: Payer: Self-pay | Admitting: Family Medicine

## 2024-04-01 ENCOUNTER — Other Ambulatory Visit: Payer: Self-pay | Admitting: Family Medicine

## 2024-04-01 DIAGNOSIS — I10 Essential (primary) hypertension: Secondary | ICD-10-CM

## 2024-04-01 DIAGNOSIS — E785 Hyperlipidemia, unspecified: Secondary | ICD-10-CM

## 2024-04-01 MED ORDER — SEMAGLUTIDE-WEIGHT MANAGEMENT 0.5 MG/0.5ML ~~LOC~~ SOAJ: 0.5000 mg | SUBCUTANEOUS | 0 refills | Status: DC

## 2024-04-02 ENCOUNTER — Other Ambulatory Visit (HOSPITAL_COMMUNITY): Payer: Self-pay

## 2024-04-02 ENCOUNTER — Telehealth: Payer: Self-pay

## 2024-04-02 NOTE — Telephone Encounter (Signed)
 Called and spoke to pt

## 2024-04-02 NOTE — Telephone Encounter (Signed)
 Pharmacy Patient Advocate Encounter   Received notification from Patient Pharmacy that prior authorization for Wegovy  0.5 is required/requested.   Insurance verification completed.   The patient is insured through [place] .   Per test claim: patient has a plan benefit exclusion

## 2024-04-02 NOTE — Telephone Encounter (Signed)
 Pharmacy Patient Advocate Encounter   Received notification from Physician's Office that prior authorization for Wegovy  is required/requested.   Insurance verification completed.   The patient is insured through [facility] MEDICAID .   Per test claim: not covered

## 2024-04-02 NOTE — Telephone Encounter (Signed)
 Please send though medicaid of [place]

## 2024-04-12 NOTE — Telephone Encounter (Signed)
 Noted

## 2024-04-28 ENCOUNTER — Other Ambulatory Visit (HOSPITAL_COMMUNITY): Payer: Self-pay

## 2024-04-28 ENCOUNTER — Telehealth: Payer: Self-pay

## 2024-04-28 NOTE — Telephone Encounter (Signed)
 Pharmacy Patient Advocate Encounter   Received notification from CoverMyMeds that prior authorization for Wegovy  0.5 is required/requested.   Insurance verification completed.   The patient is insured through [facility] MEDICAID .   Per test claim: The current 28 day co-pay is, $4.00.  No PA needed at this time. This test claim was processed through [facility]- copay amounts may vary at other pharmacies due to pharmacy/plan contracts, or as the patient moves through the different stages of their insurance plan.

## 2024-05-05 ENCOUNTER — Encounter: Payer: Self-pay | Admitting: Family Medicine

## 2024-05-05 ENCOUNTER — Telehealth: Payer: Self-pay

## 2024-05-05 ENCOUNTER — Ambulatory Visit (INDEPENDENT_AMBULATORY_CARE_PROVIDER_SITE_OTHER): Admitting: Family Medicine

## 2024-05-05 VITALS — BP 124/76 | HR 71 | Temp 97.8°F | Resp 20 | Ht 64.0 in | Wt 368.0 lb

## 2024-05-05 DIAGNOSIS — F39 Unspecified mood [affective] disorder: Secondary | ICD-10-CM

## 2024-05-05 DIAGNOSIS — Z6841 Body Mass Index (BMI) 40.0 and over, adult: Secondary | ICD-10-CM

## 2024-05-05 DIAGNOSIS — E785 Hyperlipidemia, unspecified: Secondary | ICD-10-CM

## 2024-05-05 DIAGNOSIS — I10 Essential (primary) hypertension: Secondary | ICD-10-CM

## 2024-05-05 MED ORDER — SPIRONOLACTONE 50 MG PO TABS: 50.0000 mg | ORAL_TABLET | Freq: Every day | ORAL | 3 refills | Status: DC

## 2024-05-05 MED ORDER — SEMAGLUTIDE-WEIGHT MANAGEMENT 0.5 MG/0.5ML ~~LOC~~ SOAJ: 0.5000 mg | SUBCUTANEOUS | 3 refills | Status: DC

## 2024-05-05 MED ORDER — INDAPAMIDE 2.5 MG PO TABS: 2.5000 mg | ORAL_TABLET | Freq: Every day | ORAL | 3 refills | Status: DC

## 2024-05-05 MED ORDER — FLUOXETINE HCL 40 MG PO CAPS: 40.0000 mg | ORAL_CAPSULE | Freq: Every day | ORAL | 3 refills | Status: DC

## 2024-05-05 MED ORDER — BREXPIPRAZOLE 1 MG PO TABS: 1.0000 mg | ORAL_TABLET | Freq: Every day | ORAL | 3 refills | Status: DC

## 2024-05-05 MED ORDER — BUPROPION HCL ER (SR) 100 MG PO TB12
300.0000 mg | ORAL_TABLET | Freq: Every day | ORAL | 3 refills | Status: DC
Start: 2024-05-05 — End: 2024-08-19

## 2024-05-05 NOTE — Assessment & Plan Note (Signed)
 She reports anxiety with a sensation of being on the run and difficulty concentrating. Anxiety is not severe enough to discontinue medication, but feels this occurs within first few days of GLP1 injection - Decrease Rexulti  to 1 mg. - Increase Wellbutrin  ER to 200 mg am and 100 mg pm. This is original dose - Refill Prozac  40 mg daily

## 2024-05-05 NOTE — Telephone Encounter (Signed)
 Called pharmacy and let them know that provider meant to sent the 1 MG and they stated they just filled the 2 Mg yesterday.

## 2024-05-05 NOTE — Progress Notes (Signed)
 SUBJECTIVE:    HPI Presents for follow up Chronic disease management   Discussed the use of AI scribe software for clinical note transcription with the patient, who gave verbal consent to proceed.  History of Present Illness Erika Hanson is a 58  year old female who presents with anxiety possibly related to Wegovy  use.  She has experienced increased anxiety since starting Wegovy , described as 'weird' and different from her usual anxiety. Symptoms include a constant feeling of being on the run and an inability to concentrate.  She attempted a word search to help with concentration, but it worsened her symptoms. Despite these symptoms, she feels the anxiety is not severe enough to discontinue the medication, as she perceives an overall improvement.  The anxiety is the only significant change she has noticed since starting Wegovy .     PERTINENT PMH / PSH: As above   OBJECTIVE:  BP 124/76   Pulse 71   Temp 97.8 F (36.7 C)   Resp 20   Ht 5' 4 (1.614m)   Wt 368 lb (166.9 kg)   SpO2 96%   BMI 63.17 kg/m    Physical Exam Vitals reviewed.  Constitutional:      General: She is not in acute distress.    Appearance: She is obese. She is not ill-appearing.  HENT:     Head: Normocephalic.  Cardiovascular:     Rate and Rhythm: Normal rate and regular rhythm.     Pulses: Normal pulses.  Pulmonary:     Effort: Pulmonary effort is normal.  Neurological:     General: No focal deficit present.     Mental Status: She is alert and oriented to person, place, and time. Mental status is at baseline.  Psychiatric:        Mood and Affect: Mood is depressed. Mood is not anxious.        Speech: Speech normal.        Behavior: Behavior normal. Behavior is cooperative.        Thought Content: Thought content normal. Thought content does not include homicidal or suicidal ideation. Thought content does not include homicidal or suicidal plan.        Cognition and Memory: Cognition  normal.        Judgment: Judgment normal.         05/05/2024    9:19 AM 02/11/2024    8:49 AM 12/29/2023    8:12 AM 11/03/2023    3:13 PM 06/25/2023    8:57 AM  Depression screen PHQ 2/9  Decreased Interest 3 0 1 2 1   Down, Depressed, Hopeless 3 0 1 0 1  PHQ - 2 Score 6 0 2 2 2   Altered sleeping 0 1 2 1 1   Tired, decreased energy 3 0 2 1 1   Change in appetite 0 0 1 0 0  Feeling bad or failure about yourself  0 0 1 0 0  Trouble concentrating 3 0 0 1 0  Moving slowly or fidgety/restless 0 0 0 0 0  Suicidal thoughts 0 0 1 0 0  PHQ-9 Score 12 1 9 5 4   Difficult doing work/chores Very difficult Not difficult at all Somewhat difficult Somewhat difficult Somewhat difficult        05/05/2024    9:20 AM 02/11/2024    8:49 AM 12/29/2023    8:12 AM 06/25/2023    8:57 AM  GAD 7 : Generalized Anxiety Score  Nervous, Anxious, on Edge 3 0 2  2  Control/stop worrying 3 0 2 2  Worry too much - different things 3 0 2 2  Trouble relaxing 3 0 1 1  Restless 3 0 1 1  Easily annoyed or irritable 3 1 2 3   Afraid - awful might happen 3 0 1 1  Total GAD 7 Score 21 1 11 12   Anxiety Difficulty Very difficult Not difficult at all Somewhat difficult Somewhat difficult       ASSESSMENT/PLAN:   Mood Disorder She reports anxiety with a sensation of being on the run and difficulty concentrating. Anxiety is not severe enough to discontinue medication, but feels this occurs within first few days of GLP1 injection.  Depression and GAD screening has increased.  Likely from increase in Rexulti  and decrease in Wellbutrin . - Decrease Rexulti  to 1 mg. - Increase Wellbutrin  ER to 200 mg am and 100 mg pm. This is original dose - Refill Prozac  40 mg daily     Morbid Obesity  Wegovy  initiated for weight management. Feelings of anxiety first couple of days after injection.  Has been improving - Refill Wegovy  0.5 mg weekly - Educate on proper eating habits to maintain weight loss. - Would benefit from [facility] for continued weight loss management    Hypertension Well controlled - Continue Aldactone  50 mg daily - Continue Telmisartan  80 mg daily - Refill Indapamide  2.5 mg daily   PDMP reviewed  Return in about 6 months (around 11/04/2024).  Valli Gaw, MD

## 2024-05-05 NOTE — Patient Instructions (Signed)
 It was a pleasure meeting you today. Thank you for allowing me to take part in your health care.  Our goals for today as we discussed include:  Increase Wellbutrin  back to 200 mg morning and 100 mg in evening  Continue Wegovy  0.5 mg weekly  Recommend therapy.  Call insurance to see who is in network  For Mental Health Concerns  [facility] [phone] Address: [address] [place], [place] [address] Hours: Open 24/7, No appointment required.    Look at [facility] website to see if this may be an option for you with your weight loss. [facility] [address] [place] [phone]    Refills sent for requested medications  This is a list of the screening recommended for you and due dates:  Health Maintenance  Topic Date Due   Cologuard (Stool DNA test)  Never done   Pap with HPV screening  08/27/2023   Mammogram  09/26/2023   Zoster (Shingles) Vaccine (1 of 2) 05/13/2024*   Pneumococcal Vaccination (1 of 2 - PCV) 02/10/2025*   Flu Shot  06/25/2024   Medicare Annual Wellness Visit  11/02/2024   DTaP/Tdap/Td vaccine (2 - Td or Tdap) 06/18/2028   Hepatitis C Screening  Completed   HIV Screening  Completed   HPV Vaccine  Aged Out   Meningitis B Vaccine  Aged Out   COVID-19 Vaccine  Discontinued  *Topic was postponed. The date shown is not the original due date.      If you have any questions or concerns, please do not hesitate to call the office at [phone].  I look forward to our next visit and until then take care and stay safe.  Regards,   Valli Gaw, MD   [facility]

## 2024-05-05 NOTE — Assessment & Plan Note (Signed)
 Well controlled - Continue Aldactone  50 mg daily - Continue Telmisartan  80 mg daily - Refill Indapamide  2.5 mg daily

## 2024-05-05 NOTE — Assessment & Plan Note (Signed)
 Wegovy  initiated for weight management. Feelings of anxiety first couple of days after injection.  Has been improving - Refill Wegovy  0.5 mg weekly - Educate on proper eating habits to maintain weight loss. - Would benefit from [facility] for continued weight loss management

## 2024-05-05 NOTE — Telephone Encounter (Signed)
 Copied from CRM [id number]. Topic: Clinical - Medication Question >> May 05, 2024  3:29 PM Adonis Hoot wrote: Reason for CRM:  [facility] called in stating that they usually fill  Rexulti  for 2mg ,however they received prescription for 1mg .They would like to know if her dosage has decreased so that they could deactivated the 2mg  prescription out of their system?  [facility] - [place], [place] - [address]  Phone: [phone] Fax: [phone]

## 2024-05-10 ENCOUNTER — Other Ambulatory Visit: Payer: Self-pay | Admitting: Family Medicine

## 2024-05-10 DIAGNOSIS — M62838 Other muscle spasm: Secondary | ICD-10-CM

## 2024-05-14 ENCOUNTER — Other Ambulatory Visit: Payer: Self-pay | Admitting: Family Medicine

## 2024-05-14 DIAGNOSIS — I10 Essential (primary) hypertension: Secondary | ICD-10-CM

## 2024-05-25 ENCOUNTER — Encounter: Payer: Self-pay | Admitting: Family Medicine

## 2024-06-07 ENCOUNTER — Other Ambulatory Visit: Payer: Self-pay | Admitting: Family Medicine

## 2024-06-07 DIAGNOSIS — M62838 Other muscle spasm: Secondary | ICD-10-CM

## 2024-06-07 DIAGNOSIS — F39 Unspecified mood [affective] disorder: Secondary | ICD-10-CM

## 2024-06-17 ENCOUNTER — Other Ambulatory Visit (HOSPITAL_COMMUNITY): Payer: Self-pay

## 2024-08-18 ENCOUNTER — Other Ambulatory Visit: Payer: Self-pay

## 2024-08-18 DIAGNOSIS — F39 Unspecified mood [affective] disorder: Secondary | ICD-10-CM

## 2024-08-18 NOTE — Telephone Encounter (Signed)
 Copied from CRM #[id number]. Topic: Clinical - Medication Refill >> Aug 18, 2024  3:21 PM Leah C wrote: Medication: buPROPion  ER (WELLBUTRIN  SR) 100 MG 12 hr tablet  Has the patient contacted their pharmacy? Yes pharmacy stated that they sent for a refill and received the notice that refill was responded to by other means but they did not receive any refill request at all. Patient is exisitng of Dr. Hope and has an appt tomorrow with Dr. Abbey.  (Agent: If no, request that the patient contact the pharmacy for the refill. If patient does not wish to contact the pharmacy document the reason why and proceed with request.) (Agent: If yes, when and what did the pharmacy advise?)  This is the patient's preferred pharmacy:  [facility] - [place], [place] - [address] [place] [address] Phone: [phone] Fax: [phone]  Is this the correct pharmacy for this prescription? Yes If no, delete pharmacy and type the correct one.   Has the prescription been filled recently? Yes  Is the patient out of the medication? Yes  Has the patient been seen for an appointment in the last year OR does the patient have an upcoming appointment? Yes  Can we respond through MyChart? Yes  Agent: Please be advised that Rx refills may take up to 3 business days. We ask that you follow-up with your pharmacy.

## 2024-08-19 ENCOUNTER — Ambulatory Visit

## 2024-08-19 VITALS — BP 120/80 | HR 69 | Temp 98.0°F | Ht 64.0 in | Wt 366.6 lb

## 2024-08-19 DIAGNOSIS — R7303 Prediabetes: Secondary | ICD-10-CM

## 2024-08-19 DIAGNOSIS — J301 Allergic rhinitis due to pollen: Secondary | ICD-10-CM

## 2024-08-19 DIAGNOSIS — R600 Localized edema: Secondary | ICD-10-CM | POA: Diagnosis not present

## 2024-08-19 DIAGNOSIS — K219 Gastro-esophageal reflux disease without esophagitis: Secondary | ICD-10-CM

## 2024-08-19 DIAGNOSIS — N1831 Chronic kidney disease, stage 3a: Secondary | ICD-10-CM

## 2024-08-19 DIAGNOSIS — F39 Unspecified mood [affective] disorder: Secondary | ICD-10-CM | POA: Diagnosis not present

## 2024-08-19 DIAGNOSIS — E782 Mixed hyperlipidemia: Secondary | ICD-10-CM

## 2024-08-19 DIAGNOSIS — M2550 Pain in unspecified joint: Secondary | ICD-10-CM

## 2024-08-19 DIAGNOSIS — I10 Essential (primary) hypertension: Secondary | ICD-10-CM

## 2024-08-19 DIAGNOSIS — M62838 Other muscle spasm: Secondary | ICD-10-CM

## 2024-08-19 DIAGNOSIS — R7309 Other abnormal glucose: Secondary | ICD-10-CM

## 2024-08-19 DIAGNOSIS — L4050 Arthropathic psoriasis, unspecified: Secondary | ICD-10-CM

## 2024-08-19 DIAGNOSIS — K581 Irritable bowel syndrome with constipation: Secondary | ICD-10-CM

## 2024-08-19 DIAGNOSIS — J329 Chronic sinusitis, unspecified: Secondary | ICD-10-CM

## 2024-08-19 DIAGNOSIS — E790 Hyperuricemia without signs of inflammatory arthritis and tophaceous disease: Secondary | ICD-10-CM

## 2024-08-19 MED ORDER — LINACLOTIDE 290 MCG PO CAPS: ORAL_CAPSULE | ORAL | 3 refills | Status: AC

## 2024-08-19 MED ORDER — TIZANIDINE HCL 4 MG PO TABS: 4.0000 mg | ORAL_TABLET | Freq: Two times a day (BID) | ORAL | 1 refills | Status: DC | PRN

## 2024-08-19 MED ORDER — ALLOPURINOL 100 MG PO TABS: 50.0000 mg | ORAL_TABLET | Freq: Every day | ORAL | 3 refills | Status: AC

## 2024-08-19 MED ORDER — SPIRONOLACTONE 50 MG PO TABS: 50.0000 mg | ORAL_TABLET | Freq: Every day | ORAL | 3 refills | Status: AC

## 2024-08-19 MED ORDER — INDAPAMIDE 2.5 MG PO TABS: 2.5000 mg | ORAL_TABLET | Freq: Every day | ORAL | 3 refills | Status: AC

## 2024-08-19 MED ORDER — TELMISARTAN 80 MG PO TABS: 80.0000 mg | ORAL_TABLET | Freq: Every evening | ORAL | 3 refills | Status: AC

## 2024-08-19 MED ORDER — FLUOXETINE HCL 40 MG PO CAPS: 40.0000 mg | ORAL_CAPSULE | Freq: Every day | ORAL | 3 refills | Status: AC

## 2024-08-19 MED ORDER — OMEPRAZOLE 20 MG PO CPDR: 20.0000 mg | DELAYED_RELEASE_CAPSULE | Freq: Every day | ORAL | 3 refills | Status: AC

## 2024-08-19 MED ORDER — BREXPIPRAZOLE 1 MG PO TABS: 1.0000 mg | ORAL_TABLET | Freq: Every day | ORAL | 1 refills | Status: AC

## 2024-08-19 MED ORDER — FLUTICASONE PROPIONATE 50 MCG/ACT NA SUSP: 2.0000 | Freq: Every day | NASAL | 11 refills | Status: AC | PRN

## 2024-08-19 MED ORDER — BUPROPION HCL ER (SR) 100 MG PO TB12: ORAL_TABLET | ORAL | 1 refills | Status: AC

## 2024-08-19 MED ORDER — CELECOXIB 100 MG PO CAPS: 100.0000 mg | ORAL_CAPSULE | Freq: Two times a day (BID) | ORAL | 3 refills | Status: AC

## 2024-08-19 NOTE — Assessment & Plan Note (Signed)
 Chronic. Previously saw nephrology but not longer seeing them. On Celebrex  chronically. Check CMP, if GFR continues to decrease will counsel against continuing to take NSAIDs and will recommend nephrology evaluation. If GFR stable with continue to monitor labs periodically. Continue Telmisartan  80 mg daily, refill sent.

## 2024-08-19 NOTE — Assessment & Plan Note (Signed)
 Recent uric acid 9.4 during rheumatology clinic on 08/10/24, previously was on hydrochlorothiazide  which was stopped due to elevated uric acid level. Start Allopurinol  50 mg, repeat uric acid level in 2-3 months.

## 2024-08-19 NOTE — Assessment & Plan Note (Addendum)
 Chronic, stable on Linzess  290 mcg daily, continue. Refill sent. Encouraged hydration, high fiber diet. Increase dietary fiber to 25-30 grams per day

## 2024-08-19 NOTE — Assessment & Plan Note (Signed)
 Chronic, continue to f/u with [facility], Dr. Lynwood Ramsay, currently on Cosentyx monthly, Celebrex  100 mg BID.

## 2024-08-19 NOTE — Assessment & Plan Note (Signed)
 Stable with prn nasal flonase , 2 puffs in each nostril prn, refill sent.

## 2024-08-19 NOTE — Assessment & Plan Note (Signed)
 Wegovy  caused worsening anxiety. Patient elected to discontinue.  Not interested in bariatric surgery yet.  Tolerated Ozempic  in the past but patient is not diabetic.  Referral to [facility] done today to help with weight management.

## 2024-08-19 NOTE — Assessment & Plan Note (Signed)
 B/L knees, lower back. Recommend patient f/u with previous orthopedic surgeon for potential ultrasound guided steroid injection as she is already on Celebrex  100 mg BID. If she is unable to do so recommend reach out to us  and I will refer her to different ortho group.
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
## 2024-08-19 NOTE — Assessment & Plan Note (Signed)
-   Feels fatigued, no plan of SI/HI. Is interested in reestablishing care of psychiatrist and psychologist for further management. Referral to both done today. Continue - bupropion  200 mg in AM and 100 mg in PM, refill sent.  - brexpiprazole  1 mg daily, refill sent - fluoxetine  40 mg daily, refill sent.

## 2024-08-19 NOTE — Assessment & Plan Note (Signed)
 BP within goal today, check CMP. Previous lower leg edema improved. Continue: Telmesartan 80 mg daily Indapamide  2.5 mg daily Aldactone  50 mg Will need close renal function monitoring and medication adjustment if hyperkalemia occurs.

## 2024-08-19 NOTE — Assessment & Plan Note (Signed)
 Mostly involving back. Stable on Tizanidine  8 mg at bedtime without s/e. Refill sent, medication  s/e including sedation discussed.

## 2024-08-19 NOTE — Assessment & Plan Note (Signed)
 Check A1c.

## 2024-08-19 NOTE — Assessment & Plan Note (Signed)
 Chronic and taking Omeprazole  20 mg daily at night. Medication s/e including renal, B12 deficiency from chronic use discussed. Patient aware and would like to continue to stay on current dose.

## 2024-08-19 NOTE — Assessment & Plan Note (Signed)
 Check fasting lipids, future lab ordered.

## 2024-08-31 ENCOUNTER — Ambulatory Visit: Admitting: Licensed Clinical Social Worker

## 2024-09-02 ENCOUNTER — Encounter (INDEPENDENT_AMBULATORY_CARE_PROVIDER_SITE_OTHER): Payer: Self-pay

## 2024-09-21 ENCOUNTER — Ambulatory Visit: Admitting: Licensed Clinical Social Worker

## 2024-10-06 ENCOUNTER — Ambulatory Visit: Admitting: Licensed Clinical Social Worker

## 2024-11-03 ENCOUNTER — Ambulatory Visit: Payer: 59

## 2024-11-29 ENCOUNTER — Ambulatory Visit (INDEPENDENT_AMBULATORY_CARE_PROVIDER_SITE_OTHER): Admitting: *Deleted

## 2024-11-29 VITALS — Ht 64.0 in | Wt 365.0 lb

## 2024-11-29 DIAGNOSIS — Z0000 Encounter for general adult medical examination without abnormal findings: Secondary | ICD-10-CM | POA: Diagnosis not present

## 2024-11-29 DIAGNOSIS — Z1231 Encounter for screening mammogram for malignant neoplasm of breast: Secondary | ICD-10-CM

## 2024-11-29 NOTE — Patient Instructions (Signed)
 Erika Hanson,  Thank you for taking the time for your Medicare Wellness Visit. I appreciate your continued commitment to your health goals. Please review the care plan we discussed, and feel free to reach out if I can assist you further.  Please note that Annual Wellness Visits do not include a physical exam. Some assessments may be limited, especially if the visit was conducted virtually. If needed, we may recommend an in-person follow-up with your provider.  Ongoing Care Seeing your primary care provider every 3 to 6 months helps us  monitor your health and provide consistent, personalized care.  Consider updating your vaccines. You have an order for:  []   2D Mammogram  [x]   3D Mammogram  []   Bone Density     Please call for appointment:  [facility]  [address] [place] [address] [phone]    Make sure to wear two-piece clothing.  No lotions, powders, or deodorants the day of the appointment. Make sure to bring picture ID and insurance card.  Bring list of medications you are currently taking including any supplements.    Referrals If a referral was made during today's visit and you haven't received any updates within two weeks, please contact the referred provider directly to check on the status.  Recommended Screenings:  Health Maintenance  Topic Date Due   Pneumococcal Vaccine for age over 58 (1 of 2 - PCV) Never done   Hepatitis B Vaccine (1 of 3 - 19+ 3-dose series) Never done   Cologuard (Stool DNA test)  Never done   Zoster (Shingles) Vaccine (1 of 2) Never done   Pap with HPV screening  08/27/2023   Breast Cancer Screening  09/26/2023   Flu Shot  Never done   Medicare Annual Wellness Visit  11/29/2025   DTaP/Tdap/Td vaccine (2 - Td or Tdap) 06/18/2028   Hepatitis C Screening  Completed   HIV Screening  Completed   HPV Vaccine  Aged Out   Meningitis B Vaccine  Aged Out   COVID-19 Vaccine  Discontinued        11/29/2024    1:05 PM  Advanced Directives  Does Patient Have a Medical Advance Directive? No  Would patient like information on creating a medical advance directive? No - Patient declined    Vision: Annual vision screenings are recommended for early detection of glaucoma, cataracts, and diabetic retinopathy. These exams can also reveal signs of chronic conditions such as diabetes and high blood pressure.  Dental: Annual dental screenings help detect early signs of oral cancer, gum disease, and other conditions linked to overall health, including heart disease and diabetes.  Please see the attached documents for additional preventive care recommendations.
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
## 2024-12-08 ENCOUNTER — Ambulatory Visit (INDEPENDENT_AMBULATORY_CARE_PROVIDER_SITE_OTHER)

## 2024-12-08 ENCOUNTER — Other Ambulatory Visit (INDEPENDENT_AMBULATORY_CARE_PROVIDER_SITE_OTHER)

## 2024-12-08 DIAGNOSIS — D1801 Hemangioma of skin and subcutaneous tissue: Secondary | ICD-10-CM | POA: Diagnosis not present

## 2024-12-08 DIAGNOSIS — Z1283 Encounter for screening for malignant neoplasm of skin: Secondary | ICD-10-CM | POA: Diagnosis not present

## 2024-12-08 DIAGNOSIS — L578 Other skin changes due to chronic exposure to nonionizing radiation: Secondary | ICD-10-CM

## 2024-12-08 DIAGNOSIS — Z85828 Personal history of other malignant neoplasm of skin: Secondary | ICD-10-CM

## 2024-12-08 DIAGNOSIS — I10 Essential (primary) hypertension: Secondary | ICD-10-CM

## 2024-12-08 DIAGNOSIS — D489 Neoplasm of uncertain behavior, unspecified: Secondary | ICD-10-CM

## 2024-12-08 DIAGNOSIS — D0359 Melanoma in situ of other part of trunk: Secondary | ICD-10-CM

## 2024-12-08 DIAGNOSIS — W908XXA Exposure to other nonionizing radiation, initial encounter: Secondary | ICD-10-CM

## 2024-12-08 DIAGNOSIS — E790 Hyperuricemia without signs of inflammatory arthritis and tophaceous disease: Secondary | ICD-10-CM

## 2024-12-08 DIAGNOSIS — D039 Melanoma in situ, unspecified: Secondary | ICD-10-CM

## 2024-12-08 DIAGNOSIS — L821 Other seborrheic keratosis: Secondary | ICD-10-CM | POA: Diagnosis not present

## 2024-12-08 DIAGNOSIS — R7303 Prediabetes: Secondary | ICD-10-CM | POA: Diagnosis not present

## 2024-12-08 DIAGNOSIS — L814 Other melanin hyperpigmentation: Secondary | ICD-10-CM

## 2024-12-08 DIAGNOSIS — E782 Mixed hyperlipidemia: Secondary | ICD-10-CM | POA: Diagnosis not present

## 2024-12-08 HISTORY — DX: Melanoma in situ, unspecified: D03.9

## 2024-12-08 LAB — LIPID PANEL
Cholesterol: 249 mg/dL — ABNORMAL HIGH (ref 28–200)
HDL: 55 mg/dL
LDL Cholesterol: 162 mg/dL — ABNORMAL HIGH (ref 10–99)
NonHDL: 193.96
Total CHOL/HDL Ratio: 5
Triglycerides: 159 mg/dL — ABNORMAL HIGH (ref 10.0–149.0)
VLDL: 31.8 mg/dL (ref 0.0–40.0)

## 2024-12-08 LAB — COMPREHENSIVE METABOLIC PANEL WITH GFR
ALT: 31 U/L (ref 3–35)
AST: 21 U/L (ref 5–37)
Albumin: 4.2 g/dL (ref 3.5–5.2)
Alkaline Phosphatase: 151 U/L — ABNORMAL HIGH (ref 39–117)
BUN: 23 mg/dL (ref 6–23)
CO2: 28 meq/L (ref 19–32)
Calcium: 9.8 mg/dL (ref 8.4–10.5)
Chloride: 102 meq/L (ref 96–112)
Creatinine, Ser: 1.48 mg/dL — ABNORMAL HIGH (ref 0.40–1.20)
GFR: 39.32 mL/min — ABNORMAL LOW
Glucose, Bld: 99 mg/dL (ref 70–99)
Potassium: 4.5 meq/L (ref 3.5–5.1)
Sodium: 137 meq/L (ref 135–145)
Total Bilirubin: 0.4 mg/dL (ref 0.2–1.2)
Total Protein: 7.1 g/dL (ref 6.0–8.3)

## 2024-12-08 LAB — HEMOGLOBIN A1C: Hgb A1c MFr Bld: 6.2 % (ref 4.6–6.5)

## 2024-12-08 LAB — URIC ACID: Uric Acid, Serum: 10 mg/dL — ABNORMAL HIGH (ref 2.4–7.0)

## 2024-12-08 NOTE — Progress Notes (Signed)
 "   Subjective   Erika Hanson is a 59 y.o. female who presents for the following: Lesion(s) of concern . Patient is new patient  Today patient reports: LOC on right cheek LOC mid chest   Review of Systems:    No other skin or systemic complaints except as noted in HPI or Assessment and Plan.  The following portions of the chart were reviewed this encounter and updated as appropriate: medications, allergies, medical history  Relevant Medical History:  Personal history of non melanoma skin cancer - see medical history for full details   Objective  (SKPE) Well appearing patient in no apparent distress; mood and affect are within normal limits. Examination was performed of the: Focused Exam of: face and chest    Examination notable for: Angioma(s): Scattered red vascular papule(s)  , Lentigo/lentigines: Scattered pigmented macules that are tan to brown in color and are somewhat non-uniform in shape and concentrated in the sun-exposed areas, Seborrheic Keratosis(es): Stuck-on appearing keratotic papule(s) on the trunk, none  irritated with redness, crusting, edema, and/or partial avulsion, Actinic Damage/Elastosis: chronic sun damage: dyspigmentation, telangiectasia, and wrinkling  Examination limited by: Clothing and Patient deferred removal     Right chest 1.2cm pink brown plaque   Assessment & Plan  (SKAP)   SKIN CANCER SCREENING PERFORMED TODAY.  BENIGN SKIN FINDINGS  - Lentigines  - Seborrheic keratoses  - Hemangiomas  - Reassurance provided regarding the benign appearance of lesions noted on exam today; no treatment is indicated in the absence of symptoms/changes. - Reinforced importance of photoprotective strategies including liberal and frequent sunscreen use of a broad-spectrum SPF 30 or greater, use of protective clothing, and sun avoidance for prevention of cutaneous malignancy and photoaging.  Counseled patient on the importance of regular self-skin monitoring as well as  routine clinical skin examinations as scheduled.   ACTINIC DAMAGE - Chronic condition, secondary to cumulative UV/sun exposure - Recommend daily broad spectrum sunscreen SPF 30+ to sun-exposed areas, reapply every 2 hours as needed.  - Staying in the shade or wearing long sleeves, sun glasses (UVA+UVB protection) and wide brim hats (4-inch brim around the entire circumference of the hat) are also recommended for sun protection.  - Call for new or changing lesions.  Personal history of non melanoma skin cancer  SCC right temple treated w/MOHS 2023 - Reviewed medical history for full details  - Reviewed sun protective measures as above - Encouraged full body skin exams     Was sun protection counseling provided?: Yes   Level of service outlined above   Patient instructions (SKPI)   Procedures, orders, diagnosis for this visit:  NEOPLASM OF UNCERTAIN BEHAVIOR Right chest - Skin / nail biopsy Type of biopsy: tangential   Informed consent: discussed and consent obtained   Timeout: patient name, date of birth, surgical site, and procedure verified   Procedure prep:  Patient was prepped and draped in usual sterile fashion Prep type:  Isopropyl alcohol Anesthesia: the lesion was anesthetized in a standard fashion   Anesthetic:  1% lidocaine  w/ epinephrine  1-100,000 buffered w/ 8.4% NaHCO3 Instrument used: DermaBlade   Hemostasis achieved with: pressure and aluminum chloride   Outcome: patient tolerated procedure well   Post-procedure details: sterile dressing applied and wound care instructions given   Dressing type: bandage and petrolatum    Specimen 1 - Surgical pathology Differential Diagnosis: BCC vs other   Check Margins: No  Neoplasm of uncertain behavior -     Skin / nail biopsy -  Surgical pathology; Standing    Return to clinic: Return in about 6 months (around 06/07/2025) for TBSE.  I, Emerick Ege, CMA am acting as scribe for Lauraine JAYSON Kanaris,  MD.  Documentation: I have reviewed the above documentation for accuracy and completeness, and I agree with the above.  Lauraine JAYSON Kanaris, MD  "

## 2024-12-08 NOTE — Patient Instructions (Addendum)
 Wound Care Instructions  Cleanse wound gently with soap and water once a day then pat dry with clean gauze. Apply a thin coat of Petrolatum (petroleum jelly, Vaseline) over the wound (unless you have an allergy to this). We recommend that you use a new, sterile tube of Vaseline. Do not pick or remove scabs. Do not remove the yellow or white healing tissue from the base of the wound.  Cover the wound with fresh, clean, nonstick gauze and secure with paper tape. You may use Band-Aids in place of gauze and tape if the wound is small enough, but would recommend trimming much of the tape off as there is often too much. Sometimes Band-Aids can irritate the skin.  You should call the office for your biopsy report after 1 week if you have not already been contacted.  If you experience any problems, such as abnormal amounts of bleeding, swelling, significant bruising, significant pain, or evidence of infection, please call the office immediately.  FOR ADULT SURGERY PATIENTS: If you need something for pain relief you may take 1 extra strength Tylenol (acetaminophen) AND 2 Ibuprofen  (200mg  each) together every 4 hours as needed for pain. (do not take these if you are allergic to them or if you have a reason you should not take them.) Typically, you may only need pain medication for 1 to 3 days.        Sunscreen  Who needs sunscreen? Everyone. Sunscreen use can help prevent skin cancer by protecting you from the sun's harmful ultraviolet rays. Anyone can get skin cancer, regardless of age, gender or race. In fact, it is estimated that one in five Americans will develop skin cancer in their lifetime.  Sunscreen alone cannot fully protect you. In addition to wearing sunscreen, dermatologists recommend taking the following steps to protect your skin and find skin cancer early:  Seek shade when appropriate, remembering that the sun's rays are strongest between 10 a.m. and 2 p.m. If your shadow is shorter  than you are, seek shade. Dress to protect yourself from the sun by wearing a lightweight long-sleeved shirt, pants, a wide-brimmed hat and sunglasses, when possible.  Use extra caution near water, snow and sand as they reflect the damaging rays of the sun, which can increase your chance of sunburn.  Get vitamin D  safely through a healthy diet that may include vitamin supplements. Don't seek the sun. Avoid tanning beds. Ultraviolet light from the sun and tanning beds can cause skin cancer and wrinkling. If you want to look tan, you may wish to use a self-tanning product, but continue to use sunscreen with it.  When should I use sunscreen? Every day you go outside--even if you're just walking to and from your form of transportation. The sun emits harmful UV rays year-round. Even on cloudy days, up to 80 percent of the sun's harmful UV rays can penetrate your skin. Snow, sand and water increase the need for sunscreen because they reflect the sun's rays.  How much sunscreen should I use, and how often should I apply it? Most people only apply 25-50 percent of the recommended amount of sunscreen. Apply enough sunscreen to cover all exposed skin. Most adults need about 1 ounce -- or enough to fill a shot glass -- to fully cover their body.  Don't forget to apply to the tops of your feet, your neck, your ears and the top of your head. Apply sunscreen to dry skin 15 minutes before going outdoors.  Skin cancer also  can form on the lips. To protect your lips, apply a lip balm or lipstick that contains sunscreen with an SPF of 30 or higher.  When outdoors, reapply sunscreen approximately every two hours, or after swimming or sweating, according to the directions on the bottle.   Broad-spectrum sunscreens protect against both UVA and UVB rays. What is the difference between the rays? Sunlight consists of two types of harmful rays that reach the earth -- UVA rays and UVB rays. Overexposure to either can lead to  skin cancer. In addition to causing skin cancer, here's what each of these rays do:  UVA rays (or aging rays) can prematurely age your skin, causing wrinkles and age spots, and can pass through window glass. UVB rays (or burning rays) are the primary cause of sunburn and are blocked by window glass  There is no safe way to tan. Every time you tan, you damage your skin. As this damage builds, you speed up the aging of your skin and increase your risk for all types of skin cancer.  What is the difference between chemical and physical sunscreens? Chemical sunscreens work like a sponge, absorbing the sun's rays. They contain one or more of the following active ingredients: oxybenzone, avobenzone, octisalate, octocrylene, homosalate and octinoxate. These formulations tend to be easier to rub into the skin without leaving a white residue.   Physical sunscreens work like a shield, sitting sit on the surface of your skin and deflecting the sun's rays. They contain the active ingredients zinc oxide and/or titanium dioxide. Use this sunscreen if you have sensitive skin.   What type of sunscreen should I use? The best type of sunscreen is the one you will use again and again. Just make sure it offers broad-spectrum (UVA and UVB) protection, has an SPF of 30+, and is water-resistant. The kind of sunscreen you use is a matter of personal choice, and may vary depending on the area of the body to be protected. Available sunscreen options include lotions, creams, gels, ointments, wax sticks and sprays.  Recommended physical sunscreens for face: - Neutrogena Sheer Zinc - Aveeno Positively Mineral Sensitive - CeraVe Hydrating Mineral (also has a tinted version) - La Roche-Posay Anthelios Mineral Face (comes as a cream, lotion, light fluid, and there is also a tinted version).  - EltaMD UV Clear (also has a tinted version)  Recommended physical sunscreens for body: - Neutrogena Sheer Zinc Dry-Touch Sunscreen  Sensitive Skin Lotion Broad Spectrum SPF 50 - Aveeno Positively Mineral Sensitive Skin Sunscreen Broad Spectrum SPF 50 - La Roche-Posay Anthelios SPF 50 Mineral Sunscreen - Gentle Lotion - CeraVe Hydrating Mineral Sunscreen SPF 50  Recommended chemical sunscreens for face: - Anthelios UV Correct Face Sunscreen SPF 70 with Niacinamide - Neutrogena Clear Face Oil-Free SPF 50 with Helioplex - Neutrogena Sport Face Oil-Free SPF 70+ with Helioplex - Aveeno Protect + Hydrate Sunscreen For Face SPF 70 - La Roche-Posay Anthelios Light Fluid Sunscreen for Face SPF 60  Recommended chemical sunscreens for body: - Neutrogena Ultra Sheer Dry-Touch Sunscreen SPF 70 - Aveeno Protect + Hydrate Broad Spectrum All-Day Hydration SPF 60 (comes in a big pump) - La Roche-Posay Anthelios Melt-In Milk Sunscreen SPF 60   Melanoma ABCDEs  Melanoma is the most dangerous type of skin cancer, and is the leading cause of death from skin disease.  You are more likely to develop melanoma if you: Have light-colored skin, light-colored eyes, or red or blond hair Spend a lot of time in the sun  Tan regularly, either outdoors or in a tanning bed Have had blistering sunburns, especially during childhood Have a close family member who has had a melanoma Have atypical moles or large birthmarks  Early detection of melanoma is key since treatment is typically straightforward and cure rates are extremely high if we catch it early.   The first sign of melanoma is often a change in a mole or a new dark spot.  The ABCDE system is a way of remembering the signs of melanoma.  A for asymmetry:  The two halves do not match. B for border:  The edges of the growth are irregular. C for color:  A mixture of colors are present instead of an even brown color. D for diameter:  Melanomas are usually (but not always) greater than 6mm - the size of a pencil eraser. E for evolution:  The spot keeps changing in size, shape, and  color.  Please check your skin once per month between visits. You can use a small mirror in front and a large mirror behind you to keep an eye on the back side or your body.   If you see any new or changing lesions before your next follow-up, please call to schedule a visit.  Please continue daily skin protection including broad spectrum sunscreen SPF 30+ to sun-exposed areas, reapplying every 2 hours as needed when you're outdoors.     Due to recent changes in healthcare laws, you may see results of your pathology and/or laboratory studies on MyChart before the doctors have had a chance to review them. We understand that in some cases there may be results that are confusing or concerning to you. Please understand that not all results are received at the same time and often the doctors may need to interpret multiple results in order to provide you with the best plan of care or course of treatment. Therefore, we ask that you please give us  2 business days to thoroughly review all your results before contacting the office for clarification. Should we see a critical lab result, you will be contacted sooner.   If You Need Anything After Your Visit  If you have any questions or concerns for your doctor, please call our main line at [phone] and press option 4 to reach your doctor's medical assistant. If no one answers, please leave a voicemail as directed and we will return your call as soon as possible. Messages left after 4 pm will be answered the following business day.   You may also send us  a message via MyChart. We typically respond to MyChart messages within 1-2 business days.  For prescription refills, please ask your pharmacy to contact our office. Our fax number is [phone].  If you have an urgent issue when the clinic is closed that cannot wait until the next business day, you can page your doctor at the number below.    Please note that while we do our best to be available for  urgent issues outside of office hours, we are not available 24/7.   If you have an urgent issue and are unable to reach us , you may choose to seek medical care at your doctor's office, retail clinic, urgent care center, or emergency room.  If you have a medical emergency, please immediately call 911 or go to the emergency department.  Pager Numbers  - Dr. Hester: [phone]  - Dr. Jackquline: [phone]  - Dr. Claudene: [phone]   - Dr. Raymund: [phone]  In the  event of inclement weather, please call our main line at [phone] for an update on the status of any delays or closures.  Dermatology Medication Tips: Please keep the boxes that topical medications come in in order to help keep track of the instructions about where and how to use these. Pharmacies typically print the medication instructions only on the boxes and not directly on the medication tubes.   If your medication is too expensive, please contact our office at [phone] option 4 or send us  a message through MyChart.   We are unable to tell what your co-pay for medications will be in advance as this is different depending on your insurance coverage. However, we may be able to find a substitute medication at lower cost or fill out paperwork to get insurance to cover a needed medication.   If a prior authorization is required to get your medication covered by your insurance company, please allow us  1-2 business days to complete this process.  Drug prices often vary depending on where the prescription is filled and some pharmacies may offer cheaper prices.  The website www.goodrx.com contains coupons for medications through different pharmacies. The prices here do not account for what the cost may be with help from insurance (it may be cheaper with your insurance), but the website can give you the price if you did not use any insurance.  - You can print the associated coupon and take it with your prescription to  the pharmacy.  - You may also stop by our office during regular business hours and pick up a GoodRx coupon card.  - If you need your prescription sent electronically to a different pharmacy, notify our office through [facility] or by phone at [phone] option 4.     Si Usted Necesita Algo Despus de Su Visita  Tambin puede enviarnos un mensaje a travs de Clinical cytogeneticist. Por lo general respondemos a los mensajes de MyChart en el transcurso de 1 a 2 das hbiles.  Para renovar recetas, por favor pida a su farmacia que se ponga en contacto con nuestra oficina. Randi lakes de fax es [place] [phone].  Si tiene un asunto urgente cuando la clnica est cerrada y que no puede esperar hasta el siguiente da hbil, puede llamar/localizar a su doctor(a) al nmero que aparece a continuacin.   Por favor, tenga en cuenta que aunque hacemos todo lo posible para estar disponibles para asuntos urgentes fuera del horario de [place], no estamos disponibles las 24 horas del da, los 7 [address]  [address] de la [place].   Si tiene un problema urgente y no puede comunicarse con nosotros, puede optar por buscar atencin mdica  en el consultorio de su doctor(a), en una clnica privada, en un centro de atencin urgente o en una sala de emergencias.  Si tiene Engineer, drilling, por favor llame inmediatamente al 911 o vaya a la sala de emergencias.  Nmeros de bper  - Dr. Hester: [phone]  - Dra. Jackquline: [phone]  - Dr. Claudene: [phone]  - Dra. Kitts: [phone]  En caso de inclemencias del [place], por favor llame a nuestra lnea principal al [phone] para una actualizacin sobre el estado de cualquier retraso o cierre.  Consejos para la medicacin en dermatologa: Por favor, guarde las cajas en las que vienen los medicamentos de uso tpico para ayudarle a seguir las instrucciones sobre dnde y cmo usarlos. Las farmacias generalmente imprimen las instrucciones del medicamento slo en las  cajas y no directamente en los tubos del  medicamento.   Si su medicamento es muy caro, por favor, pngase en contacto con landry rieger llamando al [phone] y presione la opcin 4 o envenos un mensaje a travs de Clinical cytogeneticist.   No podemos decirle cul ser su copago por los medicamentos por adelantado ya que esto es diferente dependiendo de la cobertura de su seguro. Sin embargo, es posible que podamos encontrar un medicamento sustituto a Audiological scientist un formulario para que el seguro cubra el medicamento que se considera necesario.   Si se requiere una autorizacin previa para que su compaa de seguros malta su medicamento, por favor permtanos de 1 a 2 das hbiles para completar este proceso.  Los precios de los medicamentos varan con frecuencia dependiendo del Environmental consultant de dnde se surte la receta y alguna farmacias pueden ofrecer precios ms baratos.  El sitio web www.goodrx.com tiene cupones para medicamentos de Health and safety inspector. Los precios aqu no tienen en cuenta lo que podra costar con la ayuda del seguro (puede ser ms barato con su seguro), pero el sitio web puede darle el precio si no utiliz Tourist information centre manager.  - Puede imprimir el cupn correspondiente y llevarlo con su receta a la farmacia.  - Tambin puede pasar por nuestra oficina durante el horario de atencin regular y Education officer, museum una tarjeta de cupones de GoodRx.  - Si necesita que su receta se enve electrnicamente a una farmacia diferente, informe a nuestra oficina a travs de MyChart de [place] o por telfono llamando al [phone] y presione la opcin 4.

## 2024-12-13 ENCOUNTER — Ambulatory Visit: Payer: Self-pay

## 2024-12-13 ENCOUNTER — Encounter (INDEPENDENT_AMBULATORY_CARE_PROVIDER_SITE_OTHER): Payer: Self-pay

## 2024-12-13 VITALS — BP 124/74 | HR 85 | Temp 98.0°F | Ht 64.0 in | Wt 369.2 lb

## 2024-12-13 DIAGNOSIS — Z6841 Body Mass Index (BMI) 40.0 and over, adult: Secondary | ICD-10-CM | POA: Diagnosis not present

## 2024-12-13 DIAGNOSIS — N1831 Chronic kidney disease, stage 3a: Secondary | ICD-10-CM | POA: Diagnosis not present

## 2024-12-13 DIAGNOSIS — G8929 Other chronic pain: Secondary | ICD-10-CM

## 2024-12-13 DIAGNOSIS — R748 Abnormal levels of other serum enzymes: Secondary | ICD-10-CM | POA: Insufficient documentation

## 2024-12-13 DIAGNOSIS — E790 Hyperuricemia without signs of inflammatory arthritis and tophaceous disease: Secondary | ICD-10-CM

## 2024-12-13 DIAGNOSIS — N950 Postmenopausal bleeding: Secondary | ICD-10-CM | POA: Insufficient documentation

## 2024-12-13 DIAGNOSIS — M79672 Pain in left foot: Secondary | ICD-10-CM | POA: Diagnosis not present

## 2024-12-13 DIAGNOSIS — M79671 Pain in right foot: Secondary | ICD-10-CM

## 2024-12-13 DIAGNOSIS — F39 Unspecified mood [affective] disorder: Secondary | ICD-10-CM

## 2024-12-13 DIAGNOSIS — L4050 Arthropathic psoriasis, unspecified: Secondary | ICD-10-CM

## 2024-12-13 NOTE — Assessment & Plan Note (Addendum)
 Patient reports history of amenorrhea for 3 years followed by recent vaginal bleeding.  Discussed need, benefit of endometrial biopsy.  Recommend referral to gynecology for further evaluation and management.  Urgent referral to gynecology made. Orders:   Ambulatory referral to Gynecology

## 2024-12-13 NOTE — Progress Notes (Signed)
 Lab results discussed during office visit on 1/19/202.  Luke Shade, MD

## 2024-12-13 NOTE — Assessment & Plan Note (Addendum)
 Continue allopurinol  50 mg daily.  Referral to nephrology and Ortho made today. Orders:   Uric acid; Future   Comp Met (CMET); Future

## 2024-12-13 NOTE — Assessment & Plan Note (Addendum)
 Patient was previously seen by Dr. Dennise at [facility] (prefers a different nephrologist).  Considering reducing intake of carbonated beverages including ginger ale. Referral to nephrology made to evaluate into CKD, hyperuricemia. Due to known history of psoriatic arthritis she is taking Celebrex  100 mg twice a day.  Will continue this for now when she is getting evaluated by orthopedic, rheumatology.  We did discuss risks of worsening CKD on NSAIDs. Orders:   Ambulatory referral to Nephrology

## 2024-12-13 NOTE — Assessment & Plan Note (Addendum)
 Chronic, stable.  Suspect MASLD. Potential contribution from weight, diet discussed. -Advised weight loss, carbohydrate, sugary beverage reduction to help with overall health.  Check CMP during next lab.  If continues to have elevated liver enzyme will consider further evaluation including getting right upper quadrant ultrasound, and labs.

## 2024-12-13 NOTE — Assessment & Plan Note (Addendum)
 Patient with known history of psoriatic arthritis under care of rheumatologist however her insurance is no longer covering her previous rheumatologist and is looking for rheumatologist within [facility]. Referral to [facility] rheumatology made.  At the meantime recommend continuing follow-up with current rheumatologist for ongoing treatment. Orders:   Ambulatory referral to Rheumatology

## 2024-12-13 NOTE — Progress Notes (Signed)
 "  Established Patient Office Visit   Subjective  Patient ID: JOZIE WULF, female    DOB: 12-Jun-1966  Age: 59 y.o. MRN: [id number]  Chief Complaint  Patient presents with   Gastroesophageal Reflux   Hyperlipidemia   Mood    Discussed the use of AI scribe software for clinical note transcription with the patient, who gave verbal consent to proceed.  History of Present Illness Katelee Schupp Ariaunna Longsworth is a 59 year old female with anxiety, depression, CKD, morbid obesity, hyperuricemia and psoriatic arthritis who presents to go through recent lab results.  Postmenopausal vaginal: She has been experiencing vaginal bleeding after not having a period for four to five years. Initially, she noticed dark, old-looking blood a couple of weeks ago, which transitioned to fresh blood for one to two days, followed by daily dark blood. She is concerned about this unexpected bleeding.  Mood disorder: She has a history of anxiety and depression and has been seeing a therapist. However, she feels she may benefit from seeing a psychiatrist as well. She has previously seen a psychiatrist but not since her last visit. She missed a recent appointment with a therapist and has not yet been able to schedule with a psychiatrist.  Psoriatic arthritis, hyperuricemia: She reports having painful knots on the back of her heels, especially when getting up, describing the pain as feeling like a ligament is too short and ripping with each step. She has a history of psoriatic arthritis and elevated uric acid levels. She takes allopurinol  50 mg, which she breaks in half.  CKD: She has a history of kidney disease. Her recent labs showed a GFR of 39. She takes several medications, including Celebrex .     ROS As per HPI    Objective:     BP 124/74 (BP Location: Right Arm, Patient Position: Sitting)   Pulse 85   Temp 98 F (36.7 C) (Oral)   Ht 5' 4 (1.626 m)   Wt (!) 369 lb 3.2 oz (167.5 kg)   LMP 05/21/2016    SpO2 94%   BMI 63.37 kg/m      12/13/2024   10:10 AM 11/29/2024    1:13 PM 08/19/2024   10:12 AM  Depression screen PHQ 2/9  Decreased Interest 3 1 3   Down, Depressed, Hopeless 1 1 3   PHQ - 2 Score 4 2 6   Altered sleeping 1 0 0  Tired, decreased energy 3 3 3   Change in appetite 0 0 0  Feeling bad or failure about yourself  0 0 1  Trouble concentrating 2 0 1  Moving slowly or fidgety/restless 0 0 0  Suicidal thoughts 0 0 1  PHQ-9 Score 10 5 12    Difficult doing work/chores Extremely dIfficult Somewhat difficult Very difficult     Data saved with a previous flowsheet row definition      12/13/2024   10:10 AM 08/19/2024   10:13 AM 05/05/2024    9:20 AM 02/11/2024    8:49 AM  GAD 7 : Generalized Anxiety Score  Nervous, Anxious, on Edge 2 3 3  0  Control/stop worrying 2 3 3  0  Worry too much - different things 2 3 3  0  Trouble relaxing 1 2 3  0  Restless 1 1 3  0  Easily annoyed or irritable 2 3 3 1   Afraid - awful might happen 2 3 3  0  Total GAD 7 Score 12 18 21 1   Anxiety Difficulty  Very difficult Very difficult Not difficult at  all      12/13/2024   10:10 AM 11/29/2024    1:13 PM 08/19/2024   10:12 AM  Depression screen PHQ 2/9  Decreased Interest 3 1 3   Down, Depressed, Hopeless 1 1 3   PHQ - 2 Score 4 2 6   Altered sleeping 1 0 0  Tired, decreased energy 3 3 3   Change in appetite 0 0 0  Feeling bad or failure about yourself  0 0 1  Trouble concentrating 2 0 1  Moving slowly or fidgety/restless 0 0 0  Suicidal thoughts 0 0 1  PHQ-9 Score 10 5 12    Difficult doing work/chores Extremely dIfficult Somewhat difficult Very difficult     Data saved with a previous flowsheet row definition      12/13/2024   10:10 AM 08/19/2024   10:13 AM 05/05/2024    9:20 AM 02/11/2024    8:49 AM  GAD 7 : Generalized Anxiety Score  Nervous, Anxious, on Edge 2 3 3  0  Control/stop worrying 2 3 3  0  Worry too much - different things 2 3 3  0  Trouble relaxing 1 2 3  0  Restless 1 1 3  0   Easily annoyed or irritable 2 3 3 1   Afraid - awful might happen 2 3 3  0  Total GAD 7 Score 12 18 21 1   Anxiety Difficulty  Very difficult Very difficult Not difficult at all   SDOH Screenings   Food Insecurity: No Food Insecurity (11/29/2024)  Housing: Low Risk (11/29/2024)  Transportation Needs: No Transportation Needs (11/29/2024)  Utilities: Not At Risk (11/29/2024)  Alcohol Screen: Low Risk (11/29/2024)  Depression (PHQ2-9): Medium Risk (12/13/2024)  Financial Resource Strain: Low Risk (11/29/2024)  Physical Activity: Inactive (11/29/2024)  Social Connections: Socially Isolated (11/29/2024)  Stress: No Stress Concern Present (11/29/2024)  Tobacco Use: Low Risk (12/13/2024)  Health Literacy: Adequate Health Literacy (11/29/2024)     Physical Exam Constitutional:      Appearance: Normal appearance. She is obese.  HENT:     Head: Normocephalic and atraumatic.     Mouth/Throat:     Mouth: Mucous membranes are moist.  Neck:     Thyroid : No thyroid  mass or thyroid  tenderness.  Cardiovascular:     Rate and Rhythm: Normal rate and regular rhythm.  Pulmonary:     Effort: Pulmonary effort is normal.     Breath sounds: Normal breath sounds. No wheezing.  Abdominal:     General: Bowel sounds are normal.     Palpations: Abdomen is soft.     Tenderness: There is no guarding.  Musculoskeletal:     Cervical back: Neck supple. No rigidity.     Right lower leg: No edema.     Left lower leg: No edema.  Feet:     Comments: Bilateral calcaneal prominence noted on exam without erythema, warmth, fluctuance on exam. Skin:    General: Skin is warm.  Neurological:     Mental Status: She is alert and oriented to person, place, and time.  Psychiatric:        Mood and Affect: Mood normal.        Behavior: Behavior normal.        Thought Content: Thought content does not include homicidal or suicidal ideation. Thought content does not include homicidal or suicidal plan.        No results found for any  visits on 12/13/24.  The 10-year ASCVD risk score (Arnett DK, et al., 2019) is: 3.4%  Following lab results discussed during today's visit:  Component     Latest Ref Rng 12/08/2024  Sodium     135 - 145 mEq/L 137   Potassium     3.5 - 5.1 mEq/L 4.5   Chloride     96 - 112 mEq/L 102   CO2     19 - 32 mEq/L 28   Glucose     70 - 99 mg/dL 99   BUN     6 - 23 mg/dL 23   Creatinine     9.59 - 1.20 mg/dL 8.51 (H)   Total Bilirubin     0.2 - 1.2 mg/dL 0.4   Alkaline Phosphatase     39 - 117 U/L 151 (H)   AST     5 - 37 U/L 21   ALT     3 - 35 U/L 31   Total Protein     6.0 - 8.3 g/dL 7.1   Albumin     3.5 - 5.2 g/dL 4.2   GFR     >39.99 mL/min 39.32 (L)   Calcium     8.4 - 10.5 mg/dL 9.8   Cholesterol     28 - 200 mg/dL 750 (H)   Triglycerides     10.0 - 149.0 mg/dL 840.9 (H)   HDL Cholesterol     >39.00 mg/dL 44.99   VLDL     0.0 - 40.0 mg/dL 68.1   LDL (calc)     10 - 99 mg/dL 837 (H)   Total CHOL/HDL Ratio 5   NonHDL 193.96   Hemoglobin A1C     4.6 - 6.5 % 6.2   Uric Acid, Serum     2.4 - 7.0 mg/dL 89.9 (H)       Assessment & Plan:  Patient is a pleasant 59 year old female presenting to go through recent lab results and for chronic disease management. The 10-year ASCVD risk score (Arnett DK, et al., 2019) is: 3.4% will continue to monitor her lipid panel intermittently.  A1c continues to be in prediabetic range.  Assessment & Plan Mood disorder No SI, HI.  Continue current medication including Wellbutrin  100 mg 2 tablets in a.m. and 1 tablet in p.m.  Continue Rexulti  1 mg daily, fluoxetine  40 mg daily.  Patient was not able to establish care with psychologist.  She plans on calling behavioral health department to establish appointment with a therapist.  Patient is interested in seeing a psychiatrist.  Referral to [facility] made today.  Recommend patient reach out to our clinic if she has not heard for an appointment within  couple of weeks.  Orders:   Ambulatory referral to Psychiatry  Chronic pain of both feet Of bilateral calcaneal area.  She has bilateral calcaneal prominence without tenderness, erythema, warmth, fluctuance to palpation.  Referral to orthopedic department made today for further evaluation and management. Orders:   Ambulatory referral to Orthopedic Surgery  Psoriatic arthritis [facility]) Patient with known history of psoriatic arthritis under care of rheumatologist however her insurance is no longer covering her previous rheumatologist and is looking for rheumatologist within [facility]. Referral to [facility] rheumatology made.  At the meantime recommend continuing follow-up with current rheumatologist for ongoing treatment. Orders:   Ambulatory referral to Rheumatology  Morbid obesity with BMI of 60.0-69.9, adult [facility]) Referral to healthy weight and management made today.  Previously tried Wegovy  but noticed worsening depression. Orders:   Amb Ref to Medical Weight Management  Stage  3a chronic kidney disease (HCC) Patient was previously seen by Dr. Dennise at [facility] (prefers a different nephrologist).  Considering reducing intake of carbonated beverages including ginger ale. Referral to nephrology made to evaluate into CKD, hyperuricemia. Due to known history of psoriatic arthritis she is taking Celebrex  100 mg twice a day.  Will continue this for now when she is getting evaluated by orthopedic, rheumatology.  We did discuss risks of worsening CKD on NSAIDs. Orders:   Ambulatory referral to Nephrology  Postmenopausal bleeding Patient reports history of amenorrhea for 3 years followed by recent vaginal bleeding.  Discussed need, benefit of endometrial biopsy.  Recommend referral to gynecology for further evaluation and management.  Urgent referral to gynecology made. Orders:   Ambulatory referral to Gynecology  Hyperuricemia Continue allopurinol  50 mg daily.  Referral to  nephrology and Ortho made today. Orders:   Uric acid; Future   Comp Met (CMET); Future  Abnormal liver enzymes Chronic, stable.  Suspect MASLD. Potential contribution from weight, diet discussed. -Advised weight loss, carbohydrate, sugary beverage reduction to help with overall health.  Check CMP during next lab.  If continues to have elevated liver enzyme will consider further evaluation including getting right upper quadrant ultrasound, and labs.      Return in about 4 months (around 04/12/2025) for chronic follow up, labs 2 days before appointment.   Luke Shade, MD "

## 2024-12-13 NOTE — Assessment & Plan Note (Addendum)
 Referral to healthy weight and management made today.  Previously tried Wegovy  but noticed worsening depression. Orders:   Amb Ref to Medical Weight Management

## 2024-12-13 NOTE — Assessment & Plan Note (Addendum)
 No SI, HI.  Continue current medication including Wellbutrin  100 mg 2 tablets in a.m. and 1 tablet in p.m.  Continue Rexulti  1 mg daily, fluoxetine  40 mg daily.  Patient was not able to establish care with psychologist.  She plans on calling behavioral health department to establish appointment with a therapist.  Patient is interested in seeing a psychiatrist.  Referral to [facility] made today.  Recommend patient reach out to our clinic if she has not heard for an appointment within couple of weeks.  Orders:   Ambulatory referral to Psychiatry

## 2024-12-13 NOTE — Assessment & Plan Note (Addendum)
 Of bilateral calcaneal area.  She has bilateral calcaneal prominence without tenderness, erythema, warmth, fluctuance to palpation.  Referral to orthopedic department made today for further evaluation and management. Orders:   Ambulatory referral to Orthopedic Surgery

## 2024-12-14 ENCOUNTER — Telehealth: Payer: Self-pay

## 2024-12-14 LAB — SURGICAL PATHOLOGY

## 2024-12-14 NOTE — Telephone Encounter (Signed)
 [facility] calling-verbal results- right chest MM in situ, report will be faxed here today

## 2024-12-16 ENCOUNTER — Ambulatory Visit: Payer: Self-pay

## 2024-12-21 ENCOUNTER — Other Ambulatory Visit: Payer: Self-pay

## 2024-12-21 DIAGNOSIS — M62838 Other muscle spasm: Secondary | ICD-10-CM

## 2024-12-21 NOTE — Telephone Encounter (Signed)
 Surgery and TBSE scheduled. Your first available surgery was 02/01/2025. Ok for patient to wait?

## 2024-12-21 NOTE — Telephone Encounter (Signed)
-----   Message from Lauraine Kanaris, MD sent at 12/21/2024  1:57 PM EST -----       1. Skin, right chest :       MELANOMA IN SITU, PERIPHERAL MARGIN INVOLVED, SEE DESCRIPTION   Called and notified of bx results showing MIS. Rec excision and FBSE q25mo   Please schedule excision and skin check in 3 mo - thx!

## 2024-12-22 ENCOUNTER — Ambulatory Visit (INDEPENDENT_AMBULATORY_CARE_PROVIDER_SITE_OTHER)

## 2024-12-22 ENCOUNTER — Ambulatory Visit

## 2024-12-22 VITALS — BP 125/78 | HR 72 | Ht 64.0 in | Wt 369.0 lb

## 2024-12-22 DIAGNOSIS — M25571 Pain in right ankle and joints of right foot: Secondary | ICD-10-CM | POA: Diagnosis not present

## 2024-12-22 DIAGNOSIS — M9262 Juvenile osteochondrosis of tarsus, left ankle: Secondary | ICD-10-CM

## 2024-12-22 DIAGNOSIS — M25572 Pain in left ankle and joints of left foot: Secondary | ICD-10-CM | POA: Diagnosis not present

## 2024-12-22 DIAGNOSIS — M9261 Juvenile osteochondrosis of tarsus, right ankle: Secondary | ICD-10-CM | POA: Diagnosis not present
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
## 2024-12-28 ENCOUNTER — Ambulatory Visit: Admitting: Podiatry

## 2024-12-28 DIAGNOSIS — M7662 Achilles tendinitis, left leg: Secondary | ICD-10-CM

## 2024-12-28 DIAGNOSIS — Z0289 Encounter for other administrative examinations: Secondary | ICD-10-CM

## 2024-12-28 DIAGNOSIS — M7661 Achilles tendinitis, right leg: Secondary | ICD-10-CM

## 2024-12-29 ENCOUNTER — Ambulatory Visit (INDEPENDENT_AMBULATORY_CARE_PROVIDER_SITE_OTHER): Admitting: Internal Medicine

## 2024-12-29 ENCOUNTER — Encounter

## 2024-12-29 ENCOUNTER — Encounter (INDEPENDENT_AMBULATORY_CARE_PROVIDER_SITE_OTHER): Payer: Self-pay | Admitting: Internal Medicine

## 2024-12-29 VITALS — BP 115/76 | HR 67 | Temp 97.7°F | Ht 63.0 in | Wt 363.0 lb

## 2024-12-29 DIAGNOSIS — E66813 Obesity, class 3: Secondary | ICD-10-CM

## 2024-12-29 DIAGNOSIS — I129 Hypertensive chronic kidney disease with stage 1 through stage 4 chronic kidney disease, or unspecified chronic kidney disease: Secondary | ICD-10-CM | POA: Diagnosis not present

## 2024-12-29 DIAGNOSIS — Z6841 Body Mass Index (BMI) 40.0 and over, adult: Secondary | ICD-10-CM | POA: Insufficient documentation

## 2024-12-29 DIAGNOSIS — I10 Essential (primary) hypertension: Secondary | ICD-10-CM

## 2024-12-29 DIAGNOSIS — K760 Fatty (change of) liver, not elsewhere classified: Secondary | ICD-10-CM | POA: Diagnosis not present

## 2024-12-29 DIAGNOSIS — N1831 Chronic kidney disease, stage 3a: Secondary | ICD-10-CM

## 2024-12-29 DIAGNOSIS — R7303 Prediabetes: Secondary | ICD-10-CM | POA: Diagnosis not present

## 2024-12-29 NOTE — Assessment & Plan Note (Signed)
 Hx of acquired polycystic kidney, BP well-controlled. Obesity increases risk of progression. She would benefit from either an SGLT-2 or GLP-1 medication in addition to lifestyle changes.

## 2024-12-29 NOTE — Assessment & Plan Note (Signed)
 We reviewed anthropometrics, biometrics, associated medical conditions and contributing factors with patient. she would benefit from a medically tailored reduced calorie nutrional plan based on her REE (resting energy expenditure), which will be determined by indirect calorimetry.  We will also assess for cardiometabolic risk and nutritional derangements via fasting labs at intake appointment.  Chronic condition with a history of significant weight loss and maintenance. Previous successful weight loss with low-carb diet and Topamax , discontinued due to brain fog. Ozempic  was effective but discontinued due to insurance coverage issues. Current weight maintenance. Discussed genetic and environmental factors, insulin resistance, and the role of lifestyle changes. Emphasized long-term management and potential benefits of GLP-1 medications for appetite control and metabolic benefits. Discussed potential for 22% weight loss with Zepbound  and 30-40% with surgery. Highlighted importance of lifestyle changes for sustainable results. - Check insurance coverage for Wegovy  or Zepbound . - Will consider GLP-1 medication if covered by insurance. - Will implement intensive lifestyle program with visits every 3-4 weeks. - Will discuss potential use of Qsymia if GLP-1 is not effective. - Will provide nutrition therapy and physical activity guidance. - Will conduct behavioral modification coaching.

## 2024-12-29 NOTE — Assessment & Plan Note (Signed)
 Vitals:   12/29/24 1100  BP: 115/76    Blood pressure is at goal for age and risk category.  On Telmisartan , spironaloctone, indapamide  without adverse effects.  Most recent renal parameters reviewed which showed stable electrolytes and kidney function.  Continue with weight loss therapy. Losing 10% may improve blood pressure control. Monitor for symptoms of orthostasis while losing weight. Continue current regimen and home monitoring for a goal blood pressure of < 120/80.
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
## 2024-12-29 NOTE — Assessment & Plan Note (Signed)
 Suspected MASLD based on elevated liver enzymes and imaging findings of hepatic steatosis. Discussed potential for fibrosis and need for further evaluation. GLP-1 medications may benefit liver health by reducing fat accumulation and inflammation. - Ordered additional liver function tests to assess fibrosis. - Will consider GLP-1 medication for liver health benefits. - Benefits from diet low in saturated fats and refined carbs. - Assess for steatogenic causes.

## 2024-12-29 NOTE — Assessment & Plan Note (Signed)
 Most recent A1c is  Lab Results  Component Value Date   HGBA1C 6.2 12/08/2024   HGBA1C 6.3 (H) 02/17/2017    Patient aware of disease state and risk of progression. This may contribute to abnormal cravings, fatigue and diabetic complications without having diabetes.   We have discussed treatment options which include: losing 7 to 10% of body weight, increasing physical activity to a goal of 150 minutes a week at moderate intensity.  Advised to maintain a diet low on simple and processed carbohydrates.  She may also be a candidate for pharmacoprophylaxis with metformin or incretin mimetic.

## 2025-01-12 ENCOUNTER — Encounter

## 2025-01-19 ENCOUNTER — Ambulatory Visit (INDEPENDENT_AMBULATORY_CARE_PROVIDER_SITE_OTHER): Admitting: Internal Medicine

## 2025-01-25 ENCOUNTER — Ambulatory Visit: Admitting: Podiatry

## 2025-01-31 ENCOUNTER — Encounter: Admitting: Obstetrics & Gynecology

## 2025-02-01 ENCOUNTER — Encounter

## 2025-02-02 ENCOUNTER — Ambulatory Visit (INDEPENDENT_AMBULATORY_CARE_PROVIDER_SITE_OTHER): Admitting: Internal Medicine

## 2025-03-07 ENCOUNTER — Ambulatory Visit: Admitting: Internal Medicine

## 2025-03-21 ENCOUNTER — Ambulatory Visit

## 2025-04-12 ENCOUNTER — Other Ambulatory Visit

## 2025-04-14 ENCOUNTER — Ambulatory Visit

## 2025-06-08 ENCOUNTER — Ambulatory Visit

## 2025-11-30 ENCOUNTER — Ambulatory Visit
